# Patient Record
Sex: Male | Born: 1955 | Hispanic: No | Smoking: Never smoker
Health system: Southern US, Community
[De-identification: ages and names within clinical notes are randomized; demographics above are authoritative.]

## PROBLEM LIST (undated history)

## (undated) DIAGNOSIS — I700 Atherosclerosis of aorta: Secondary | ICD-10-CM

## (undated) DIAGNOSIS — I2510 Atherosclerotic heart disease of native coronary artery without angina pectoris: Secondary | ICD-10-CM

## (undated) DIAGNOSIS — I10 Essential (primary) hypertension: Secondary | ICD-10-CM

## (undated) DIAGNOSIS — H35039 Hypertensive retinopathy, unspecified eye: Secondary | ICD-10-CM

## (undated) DIAGNOSIS — Z951 Presence of aortocoronary bypass graft: Secondary | ICD-10-CM

## (undated) DIAGNOSIS — I214 Non-ST elevation (NSTEMI) myocardial infarction: Secondary | ICD-10-CM

## (undated) HISTORY — DX: Hypertensive retinopathy, unspecified eye: H35.039

## (undated) HISTORY — DX: Atherosclerotic heart disease of native coronary artery without angina pectoris: I25.10

## (undated) HISTORY — DX: Essential (primary) hypertension: I10

---

## 1996-07-02 ENCOUNTER — Other Ambulatory Visit: Payer: Self-pay

## 2004-03-30 ENCOUNTER — Emergency Department (HOSPITAL_COMMUNITY): Admission: EM | Admit: 2004-03-30 | Discharge: 2004-03-30 | Payer: Self-pay | Admitting: Emergency Medicine

## 2008-06-09 ENCOUNTER — Encounter: Payer: Self-pay | Admitting: Internal Medicine

## 2008-06-11 ENCOUNTER — Ambulatory Visit: Payer: Self-pay | Admitting: Internal Medicine

## 2008-06-11 LAB — CONVERTED CEMR LAB
ALT: 19 units/L (ref 0–53)
AST: 23 units/L (ref 0–37)
Albumin: 4.5 g/dL (ref 3.5–5.2)
Alkaline Phosphatase: 55 units/L (ref 39–117)
BUN: 15 mg/dL (ref 6–23)
HDL: 42 mg/dL (ref >39)
Hemoglobin, Urine: NEGATIVE
LDL Cholesterol: 125 mg/dL — ABNORMAL HIGH (ref 0–99)
Leukocytes, UA: NEGATIVE
Nitrite: NEGATIVE
Potassium: 3.8 meq/L (ref 3.5–5.3)
Protein, ur: NEGATIVE mg/dL
Sodium: 140 meq/L (ref 135–145)
Total CHOL/HDL Ratio: 4.4
pH: 6 (ref 5.0–8.0)

## 2008-11-05 ENCOUNTER — Ambulatory Visit: Payer: Self-pay | Admitting: Internal Medicine

## 2008-11-05 LAB — CONVERTED CEMR LAB
Calcium: 9.3 mg/dL (ref 8.4–10.5)
Creatinine, Ser: 0.91 mg/dL (ref 0.40–1.50)

## 2011-05-18 NOTE — Consult Note (Signed)
NAMEELDER, Roger Howe NO.:  [id number]   MEDICAL RECORD NO.:  [id number]                   PATIENT TYPE:  EMS   LOCATION:  [facility]                                 FACILITY:  [facility]   PHYSICIAN:  Genene Churn. Love, M.D.                 DATE OF BIRTH:  [birth year]   DATE OF CONSULTATION:  03/30/2004  DATE OF DISCHARGE:                                   CONSULTATION   REASON FOR CONSULTATION:  This 55 year old, right-handed, [place]  married male is seen in the emergency room for evaluation of weakness and  numbness in both arms and both legs at the request of Dr. __________.   HISTORY OF PRESENT ILLNESS:  Roger Howe has a history of hypertension.  Over  the last seven to eight months he has had episodes of numbness and weakness  in both legs.  Yesterday he had some weakness and numbness in his legs and  also today while at work, and was sent to the emergency room at [facility].  He has a known history of untreated hypertension.  He denies any  associated headache, nausea, vomiting, chest pain, palpitations, bowel or  bladder symptoms from each side.  He has had no hospitalizations or  operations and does not have any history of medicines or allergies.  He has  had no history of alcohol or tobacco use.  Previous details of his history  have been dictated on admission which did not occur.  In the emergency room,  he had blood pressures of 190/107, 168/107, 164/104.  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  Intracranial MRA and  extracranial MRA were unremarkable.  Blood studies have included CPK total  of 161, white blood cell count 8600, hemoglobin 14.4, hematocrit 41.5,  platelet count 210,000.  Myoglobin 172 with upper limits of normal 199.  CK-  MB 3.8, troponin-I less than 0.05.  Total protein 7.1, albumin 3.9, SGOT 28,  SGPT 21, alk phos 50, total  protein 0.8, direct bilirubin 0.1, indirect  bilirubin 0.7.  Erythrocyte sed rate 14.  Creatinine 0.8.   IMPRESSION:  1. Numbness and weakness without objective abnormalities on examination.     344.00.  2. Hypertension.  796.2.   PLAN:  The plan at this time is to place the patient on a low dose Maxzide  and refer him to the [facility] physician system to obtain an internist to  follow his blood pressure.                                               Genene Churn. Love, M.D.    JML/MEDQ  D:  03/30/2004  T:  03/31/2004  Job:  [id number]

## 2011-05-18 NOTE — H&P (Signed)
Roger Howe, Roger Howe NO.:  [id number]   MEDICAL RECORD NO.:  [id number]                   PATIENT TYPE:  EMS   LOCATION:  [facility]                                 FACILITY:  [facility]   PHYSICIAN:  Genene Churn. Love, M.D.                 DATE OF BIRTH:  1956-03-30   DATE OF ADMISSION:  03/30/2004  DATE OF DISCHARGE:                                HISTORY & PHYSICAL   This 55 year old right-handed [place] married male is seen in the  emergency room at the request of Dr. Malachy Chamber for evaluation of weakness and  numbness.   HISTORY OF PRESENT ILLNESS:  Roger Howe has a seven to eight month history of  intermittent weakness and numbness. Seven to eight months ago he was seen by  a physician, placed on medication, and he states his symptomatology resolved  in one week. He has had intermittent hypertension which he is not treating.  He noted difficulty with weakness and numbness yesterday involving both arms  and both legs, went to work this day at [employer], and  because of complaints of weakness and numbness was sent to the emergency  room. He has been in the [place] for four years, but has very poor  English and interpretation is through his nephew. The patient has no  operations or hospitalizations. He has had no injuries. He is currently  taking no medications. He does not use drug or alcohol. He has no history of  tobacco use. He finished the 5th grade in [place]. His family history  reveals that he is married, he has five children who are not all in this  country. He has sons 24, 80, and 36, and daughters 4 and 18 who are  apparently in good health. He knows very little about his family history.  His mother is alive and well, but her age is unknown. His father died from  what may have been cancer and he does not know the age. He has one brother  who is 20 and he has five sisters whose ages he does not know. We do not  know of any known  family history of illnesses.   PHYSICAL EXAMINATION:  GENERAL: Well-developed male.  VITAL SIGNS: Blood pressure in the right and left arm 180/100 and 180/105,  heart rate of 72, and no carotid or supraclavicular bruits heard.  NEUROLOGIC: Neck flexion and extension maneuvers are unremarkable. On mental  status exam, he is alert and oriented times three. His cranial nerve  examination reveals the visual fields to be full to count fingers. Both  discs are seen and flat. The extraocular movements are full. Pupils are  reactive from 5 to 3 bilaterally. Corneals present and facial sensation is  equal. He may have had mild bilateral ptosis. Tongue midline. Uvula midline.  Gag present. Hearing present. Air conduction was greater than bone  conduction. The tongue was midline. His motor examination revealed good  strength in the upper and lower extremities. No decreased tone. No focal  atrophy. No fasciculations seen. There is some giving way phenomena with the  right iliopsoas, but other muscle strength appears to be for certain intact.  There is no evidence of any focal weakness and he can perform finger-to-  nose, heel-to-shin, rapid alternating movements with his hands, rapid  alternating movements with his feet. Sensory examination is intact to  pinprick, touch, position, and vibration testing. The deep tendon reflexes  are 1 to 2+. Plantars are downgoing. On gait examination, he can stand on  his toes and stand on his heels. His Romberg testing is negative.  HEART: No murmurs.  ABDOMEN: Bowel sounds normal. There is no organomegaly.  EXTREMITIES: There is no clubbing, cyanosis, or edema.   LABORATORY DATA:  Sodium 142, potassium 3.4, chloride 107, CO2 27, BUN 17,  creatinine 0.8, glucose 88. Hemoglobin 14.6, hematocrit 43.   IMPRESSION:  1. Complaint of generalized weakness not seen on examination (code 344.00).  2. Hypertension (code 796.2).   PLAN:  At this time in the emergency room  will obtain CK, CMP, sed rate, and  MRI of the brain and cervical region.                                                Genene Churn. Sandria Manly, M.D.    JML/MEDQ  D:  03/30/2004  T:  03/30/2004  Job:  [id number]

## 2014-05-17 ENCOUNTER — Encounter (HOSPITAL_COMMUNITY): Payer: Self-pay | Admitting: Emergency Medicine

## 2014-05-17 ENCOUNTER — Inpatient Hospital Stay (HOSPITAL_COMMUNITY)
Admission: EM | Admit: 2014-05-17 | Discharge: 2014-05-24 | DRG: 234 | Disposition: A | Discharge status: Home / Self Care | Payer: 59 | Attending: Thoracic Surgery (Cardiothoracic Vascular Surgery) | Admitting: Thoracic Surgery (Cardiothoracic Vascular Surgery)

## 2014-05-17 ENCOUNTER — Emergency Department (HOSPITAL_COMMUNITY): Payer: 59

## 2014-05-17 ENCOUNTER — Emergency Department (HOSPITAL_COMMUNITY)
Admission: EM | Admit: 2014-05-17 | Discharge: 2014-05-17 | Disposition: A | Discharge status: Other Acute Inpatient Hospital | Payer: 59 | Source: Home / Self Care

## 2014-05-17 DIAGNOSIS — M79609 Pain in unspecified limb: Secondary | ICD-10-CM

## 2014-05-17 DIAGNOSIS — E8779 Other fluid overload: Secondary | ICD-10-CM | POA: Diagnosis not present

## 2014-05-17 DIAGNOSIS — J9819 Other pulmonary collapse: Secondary | ICD-10-CM | POA: Diagnosis not present

## 2014-05-17 DIAGNOSIS — I252 Old myocardial infarction: Secondary | ICD-10-CM

## 2014-05-17 DIAGNOSIS — Z951 Presence of aortocoronary bypass graft: Secondary | ICD-10-CM

## 2014-05-17 DIAGNOSIS — R9431 Abnormal electrocardiogram [ECG] [EKG]: Secondary | ICD-10-CM

## 2014-05-17 DIAGNOSIS — Y838 Other surgical procedures as the cause of abnormal reaction of the patient, or of later complication, without mention of misadventure at the time of the procedure: Secondary | ICD-10-CM | POA: Diagnosis not present

## 2014-05-17 DIAGNOSIS — R079 Chest pain, unspecified: Secondary | ICD-10-CM

## 2014-05-17 DIAGNOSIS — M79602 Pain in left arm: Secondary | ICD-10-CM

## 2014-05-17 DIAGNOSIS — I214 Non-ST elevation (NSTEMI) myocardial infarction: Principal | ICD-10-CM | POA: Diagnosis present

## 2014-05-17 DIAGNOSIS — Z7982 Long term (current) use of aspirin: Secondary | ICD-10-CM

## 2014-05-17 DIAGNOSIS — I700 Atherosclerosis of aorta: Secondary | ICD-10-CM

## 2014-05-17 DIAGNOSIS — D72829 Elevated white blood cell count, unspecified: Secondary | ICD-10-CM | POA: Diagnosis not present

## 2014-05-17 DIAGNOSIS — Y9210 Unspecified residential institution as the place of occurrence of the external cause: Secondary | ICD-10-CM | POA: Diagnosis not present

## 2014-05-17 DIAGNOSIS — I10 Essential (primary) hypertension: Secondary | ICD-10-CM

## 2014-05-17 DIAGNOSIS — I2510 Atherosclerotic heart disease of native coronary artery without angina pectoris: Secondary | ICD-10-CM

## 2014-05-17 DIAGNOSIS — D62 Acute posthemorrhagic anemia: Secondary | ICD-10-CM | POA: Diagnosis not present

## 2014-05-17 DIAGNOSIS — J988 Other specified respiratory disorders: Secondary | ICD-10-CM | POA: Diagnosis not present

## 2014-05-17 DIAGNOSIS — Z8249 Family history of ischemic heart disease and other diseases of the circulatory system: Secondary | ICD-10-CM

## 2014-05-17 HISTORY — DX: Atherosclerotic heart disease of native coronary artery without angina pectoris: I25.10

## 2014-05-17 HISTORY — PX: CARDIAC CATHETERIZATION: SHX172

## 2014-05-17 HISTORY — DX: Non-ST elevation (NSTEMI) myocardial infarction: I21.4

## 2014-05-17 HISTORY — DX: Atherosclerosis of aorta: I70.0

## 2014-05-17 HISTORY — DX: Presence of aortocoronary bypass graft: Z95.1

## 2014-05-17 LAB — CBC WITH DIFFERENTIAL/PLATELET
BASOS PCT: 0 % (ref 0–1)
Basophils Absolute: 0 10*3/uL (ref 0.0–0.1)
EOS ABS: 0.1 10*3/uL (ref 0.0–0.7)
Eosinophils Relative: 1 % (ref 0–5)
HEMATOCRIT: 40.6 % (ref 39.0–52.0)
HEMOGLOBIN: 14.3 g/dL (ref 13.0–17.0)
LYMPHS ABS: 2 10*3/uL (ref 0.7–4.0)
Lymphocytes Relative: 22 % (ref 12–46)
MCH: 28 pg (ref 26.0–34.0)
MCHC: 35.2 g/dL (ref 30.0–36.0)
MCV: 79.6 fL (ref 78.0–100.0)
MONO ABS: 0.5 10*3/uL (ref 0.1–1.0)
MONOS PCT: 5 % (ref 3–12)
NEUTROS PCT: 72 % (ref 43–77)
Neutro Abs: 6.8 10*3/uL (ref 1.7–7.7)
Platelets: 257 10*3/uL (ref 150–400)
RBC: 5.1 MIL/uL (ref 4.22–5.81)
RDW: 13.4 % (ref 11.5–15.5)
WBC: 9.4 10*3/uL (ref 4.0–10.5)

## 2014-05-17 LAB — I-STAT CHEM 8, ED
BUN: 8 mg/dL (ref 6–23)
CALCIUM ION: 1.16 mmol/L (ref 1.12–1.23)
CREATININE: 1 mg/dL (ref 0.50–1.35)
Chloride: 104 mEq/L (ref 96–112)
GLUCOSE: 95 mg/dL (ref 70–99)
HCT: 44 % (ref 39.0–52.0)
HEMOGLOBIN: 15 g/dL (ref 13.0–17.0)
Potassium: 3.7 mEq/L (ref 3.7–5.3)
SODIUM: 143 meq/L (ref 137–147)
TCO2: 25 mmol/L (ref 0–100)

## 2014-05-17 LAB — I-STAT TROPONIN, ED: Troponin i, poc: 1.32 ng/mL (ref 0.00–0.08)

## 2014-05-17 LAB — TROPONIN I: TROPONIN I: 1.2 ng/mL — AB (ref <0.30)

## 2014-05-17 LAB — TSH: TSH: 1.49 u[IU]/mL (ref 0.350–4.500)

## 2014-05-17 LAB — MRSA PCR SCREENING: MRSA BY PCR: NEGATIVE

## 2014-05-17 MED ORDER — SODIUM CHLORIDE 0.9 % IV SOLN
1.0000 mL/kg/h | INTRAVENOUS | Status: DC
  Administered 2014-05-17: 1 mL/kg/h via INTRAVENOUS

## 2014-05-17 MED ORDER — ASPIRIN 81 MG PO CHEW
324.0000 mg | CHEWABLE_TABLET | ORAL | Status: AC
  Administered 2014-05-18: 324 mg via ORAL

## 2014-05-17 MED ORDER — SODIUM CHLORIDE 0.9 % IV SOLN: INTRAVENOUS | Status: DC

## 2014-05-17 MED ORDER — NITROGLYCERIN 2 % TD OINT
1.0000 [in_us] | TOPICAL_OINTMENT | Freq: Once | TRANSDERMAL | Status: AC
  Administered 2014-05-17: 1 [in_us] via TOPICAL
  Filled 2014-05-17: qty 1

## 2014-05-17 MED ORDER — IOHEXOL 350 MG/ML SOLN
100.0000 mL | Freq: Once | INTRAVENOUS | Status: AC | PRN
  Administered 2014-05-17: 100 mL via INTRAVENOUS

## 2014-05-17 MED ORDER — ASPIRIN 300 MG RE SUPP
300.0000 mg | RECTAL | Status: AC
  Filled 2014-05-17: qty 1

## 2014-05-17 MED ORDER — SODIUM CHLORIDE 0.9 % IJ SOLN: 3.0000 mL | INTRAMUSCULAR | Status: DC | PRN

## 2014-05-17 MED ORDER — ASPIRIN EC 81 MG PO TBEC: 81.0000 mg | DELAYED_RELEASE_TABLET | Freq: Every day | ORAL | Status: DC

## 2014-05-17 MED ORDER — SODIUM CHLORIDE 0.9 % IV SOLN: 250.0000 mL | INTRAVENOUS | Status: DC | PRN

## 2014-05-17 MED ORDER — ASPIRIN EC 81 MG PO TBEC
81.0000 mg | DELAYED_RELEASE_TABLET | Freq: Every day | ORAL | Status: DC
  Administered 2014-05-19: 81 mg via ORAL
  Filled 2014-05-17 (×2): qty 1

## 2014-05-17 MED ORDER — NITROGLYCERIN 2 % TD OINT
1.0000 [in_us] | TOPICAL_OINTMENT | Freq: Four times a day (QID) | TRANSDERMAL | Status: DC
  Administered 2014-05-17 – 2014-05-20 (×9): 1 [in_us] via TOPICAL
  Filled 2014-05-17: qty 30

## 2014-05-17 MED ORDER — HEPARIN (PORCINE) IN NACL 100-0.45 UNIT/ML-% IJ SOLN
1050.0000 [IU]/h | INTRAMUSCULAR | Status: DC
  Administered 2014-05-17: 850 [IU]/h via INTRAVENOUS
  Filled 2014-05-17 (×3): qty 250

## 2014-05-17 MED ORDER — METOPROLOL TARTRATE 25 MG PO TABS
25.0000 mg | ORAL_TABLET | Freq: Two times a day (BID) | ORAL | Status: DC
  Administered 2014-05-17 – 2014-05-19 (×5): 25 mg via ORAL
  Filled 2014-05-17 (×7): qty 1

## 2014-05-17 MED ORDER — DICLOFENAC POTASSIUM 50 MG PO TABS: 50.0000 mg | ORAL_TABLET | Freq: Three times a day (TID) | ORAL | Status: DC

## 2014-05-17 MED ORDER — HEPARIN BOLUS VIA INFUSION
4000.0000 [IU] | Freq: Once | INTRAVENOUS | Status: AC
  Administered 2014-05-17: 4000 [IU] via INTRAVENOUS
  Filled 2014-05-17: qty 4000

## 2014-05-17 MED ORDER — ASPIRIN 81 MG PO CHEW
81.0000 mg | CHEWABLE_TABLET | ORAL | Status: AC
  Administered 2014-05-18: 81 mg via ORAL
  Filled 2014-05-17: qty 1

## 2014-05-17 MED ORDER — SODIUM CHLORIDE 0.9 % IJ SOLN
3.0000 mL | Freq: Two times a day (BID) | INTRAMUSCULAR | Status: DC
  Administered 2014-05-18: 3 mL via INTRAVENOUS

## 2014-05-17 MED ORDER — SODIUM CHLORIDE 0.9 % IV SOLN
Freq: Once | INTRAVENOUS | Status: AC
  Administered 2014-05-17: 11:00:00 via INTRAVENOUS

## 2014-05-17 MED ORDER — NITROGLYCERIN 0.4 MG SL SUBL: 0.4000 mg | SUBLINGUAL_TABLET | SUBLINGUAL | Status: DC | PRN

## 2014-05-17 MED ORDER — ATORVASTATIN CALCIUM 20 MG PO TABS
20.0000 mg | ORAL_TABLET | Freq: Every day | ORAL | Status: DC
  Administered 2014-05-17 – 2014-05-19 (×3): 20 mg via ORAL
  Filled 2014-05-17 (×4): qty 1

## 2014-05-17 NOTE — Progress Notes (Signed)
Unit CM UR Completed by [facility] ED CM  W. Calil Amor RN  

## 2014-05-17 NOTE — ED Notes (Signed)
Placed  On nasal o2  At 2  l  /  Min  Cardiac  Monitor  And  Nasal  02  At  2  l  /  Min

## 2014-05-17 NOTE — ED Notes (Signed)
Report given to Carlene, RN.

## 2014-05-17 NOTE — Consult Note (Signed)
ANTICOAGULATION CONSULT NOTE - Initial Consult  Pharmacy Consult for Heparin Indication: chest pain/ACS  No Known Allergies  Patient Measurements: Height: 5' 0.75" (154.3 cm) Weight: 160 lb 7.9 oz (72.8 kg) IBW/kg (Calculated) : 51.73 Heparin Dosing Weight: 66kg  Vital Signs: Temp: 98.3 F (36.8 C) (05/18 1134) Temp src: Oral (05/18 1134) BP: 159/97 mmHg (05/18 1730) Pulse Rate: 80 (05/18 1730)  Labs:  Recent Labs  05/17/14 1322 05/17/14 1340  HGB 14.3 15.0  HCT 40.6 44.0  PLT 257  --   CREATININE  --  1.00    Estimated Creatinine Clearance: 67.6 ml/min (by C-G formula based on Cr of 1).   Medical History: Past Medical History  Diagnosis Date  . Hypertension   . Chest pain at rest 05/17/2014    Medications:  No anticoagulants pta  Assessment: 57yom presents to the ED with chest pain. Initial troponin is positive at 1.32. He will begin IV heparin for NSTEMI. Baseline CBC and renal function wnl.  Goal of Therapy:  Heparin level 0.3-0.7 units/ml Monitor platelets by anticoagulation protocol: Yes   Plan:  1) Heparin bolus 4000  units x 1 2) Heparin drip at 850 units/hr 3) Check 6 hour heparin level 4) Daily heparin level and CBC  Roger Howe Roger Howe 05/17/2014,5:49 PM

## 2014-05-17 NOTE — ED Notes (Signed)
EDP made aware of patients trop level.

## 2014-05-17 NOTE — ED Notes (Signed)
Patient transported to CT 

## 2014-05-17 NOTE — H&P (Signed)
Roger Howe is an 58 y.o. male.    Primary Cardiologist:new No primary provider on file.  Chief Complaint:  Chest and lt arm pain began 1 week ago and returned today but worse.  Does not speak [place], his nephew interpreted with the pt's permission.  HPI: 58 year old [place] male, married with 3 sons, presented to ER today with chest pain and lt arm pain.  Described as a burning pain with radiation to his back.  Discomfort began 1 week ago stabilized and then increased in severity this AM. Some SOB this am but no nausea, diaphoresis, or lightheadedness. No prior cardiac history.  He did take ASA this am.  Over the last week the pain would occur 2-3 times a day.  Pt has lived in [place] for 14 years.  Some of his family is here in the [place] other members are in [place].  He has hx of HTN but takes no meds.  Troponin Positive at 1.32, BP elevated.  EKG with LVH poss Lt atrial enlargement follow up EKG with less t wave inversion in V4-6  Past Medical History  Diagnosis Date  . Hypertension   . Chest pain at rest 05/17/2014    History reviewed. No pertinent past surgical history.  Family History  Problem Relation Age of Onset  . Heart disease Brother     lives in [place]   Social History:  reports that he has never smoked. He has never used smokeless tobacco. He reports that he does not drink alcohol or use illicit drugs. Married with 3 sons Works  Allergies: No Known Allergies  OUTPATIENT MEDICATIONS: none  Results for orders placed during the hospital encounter of 05/17/14 (from the past 48 hour(s))  CBC WITH DIFFERENTIAL     Status: None   Collection Time    05/17/14  1:22 PM      Result Value Ref Range   WBC 9.4  4.0 - 10.5 K/uL   RBC 5.10  4.22 - 5.81 MIL/uL   Hemoglobin 14.3  13.0 - 17.0 g/dL   HCT 40.940.6  81.139.0 - 91.452.0 %   MCV 79.6  78.0 - 100.0 fL   MCH 28.0  26.0 - 34.0 pg   MCHC 35.2  30.0 - 36.0 g/dL   RDW 78.213.4  95.611.5 - 21.315.5 %   Platelets 257  150 - 400  K/uL   Neutrophils Relative % 72  43 - 77 %   Neutro Abs 6.8  1.7 - 7.7 K/uL   Lymphocytes Relative 22  12 - 46 %   Lymphs Abs 2.0  0.7 - 4.0 K/uL   Monocytes Relative 5  3 - 12 %   Monocytes Absolute 0.5  0.1 - 1.0 K/uL   Eosinophils Relative 1  0 - 5 %   Eosinophils Absolute 0.1  0.0 - 0.7 K/uL   Basophils Relative 0  0 - 1 %   Basophils Absolute 0.0  0.0 - 0.1 K/uL  I-STAT TROPOININ, Roger     Status: Abnormal   Collection Time    05/17/14  1:38 PM      Result Value Ref Range   Troponin i, poc 1.32 (*) 0.00 - 0.08 ng/mL   Comment NOTIFIED PHYSICIAN     Comment 3            Comment: Due to the release kinetics of cTnI,     a negative result within the first hours  of the onset of symptoms does not rule out     myocardial infarction with certainty.     If myocardial infarction is still suspected,     repeat the test at appropriate intervals.  I-STAT CHEM 8, Roger     Status: None   Collection Time    05/17/14  1:40 PM      Result Value Ref Range   Sodium 143  137 - 147 mEq/L   Potassium 3.7  3.7 - 5.3 mEq/L   Chloride 104  96 - 112 mEq/L   BUN 8  6 - 23 mg/dL   Creatinine, Ser 9.56  0.50 - 1.35 mg/dL   Glucose, Bld 95  70 - 99 mg/dL   Calcium, Ion 2.13  0.86 - 1.23 mmol/L   TCO2 25  0 - 100 mmol/L   Hemoglobin 15.0  13.0 - 17.0 g/dL   HCT 57.8  46.9 - 62.9 %   Dg Chest Port 1 View  05/17/2014   CLINICAL DATA:  Chest pain, hypertension  EXAM: PORTABLE CHEST - 1 VIEW  COMPARISON:  Portable exam 1323 hr without priors for comparison  FINDINGS: Mild enlargement of cardiac silhouette.  Mediastinal contours and pulmonary vascularity normal.  Lungs clear.  No pleural effusion or pneumothorax.  Osseous structures unremarkable.  IMPRESSION: Mild enlargement of cardiac silhouette.  No acute abnormalities.   Electronically Signed   By: Ulyses Southward M.D.   On: 05/17/2014 13:30    ROS: General:no colds or fevers, no weight changes Skin:no rashes or ulcers HEENT:no blurred vision, no  congestion CV:see HPI PUL:see HPI GI:no diarrhea constipation or melena, no indigestion GU:no hematuria, no dysuria MS:no joint pain, no claudication Neuro:no syncope, no lightheadedness Endo:no diabetes, no thyroid disease   Blood pressure 159/98, pulse 80, temperature 98.3 F (36.8 C), temperature source Oral, resp. rate 21, height 5' 0.75" (1.543 m), SpO2 96.00%. PE: General:Pleasant affect, NAD Skin:Warm and dry, brisk capillary refill HEENT:normocephalic, sclera clear, mucus membranes moist Neck:supple, no JVD, no bruits  Heart:S1S2 RRR without murmur, gallup, rub or click Lungs:clear without rales, rhonchi, or wheezes BMW:UXLK, non tender, + BS, do not palpate liver spleen or masses Ext:no lower ext edema, 2+ pedal pulses, 2+ radial pulses Neuro:alert and oriented, MAE, follows commands, + facial symmetry    Assessment/Plan Principal Problem:   NSTEMI (non-ST elevated myocardial infarction), with lateral T wave changes, admit and further eval enzymes.  Add IV heparin, NTG paste.  BB and ace to lower BP.  Most likely cardiac cath in AM Active Problems:   Accelerated Essential hypertension, benign- control BP   Chest pain at rest, pain resolved currently.   Enlarged, mildly, cardiac silhouette check echo.  Discussed the above with pt and his nephew.  They are agreeable with plan.  Will check chol. As well.  Nada Boozer Nurse Practitioner Certified [facility] Pager [phone] or after 5pm or weekends call [phone] 05/17/2014, 5:05 PM   I have seen and evaluated the patient this PM along with Nada Boozer, NP-C. I agree with her findings, examination as well as impression recommendations.  Very quiet (doen't speak [place]) [place] gentleman with h/o untreated HTN & FH (of brother with CAD-Stent) who started having exertional & PM chest pressure/burning in center of chest.  Came to ER b/c Sx began this AM & were worse & associated with SOB. Denies  any CHF Sx of PND/orhtopnea or edema, no HA/TIA or Amaurosis Fugax Sx.   Ruled in for NSTEMI.  PLAN. Agree with BP control - BB, Nitrate With accelerated HTN & enlarge Cardiac Sillouette - agree with Chest CTA to look for dissection since pain radiates to center of back. Statin  Provided CT is OK -- would plan for [facility] +/- PCI in AM.  I spent ~20-25 min in consultation using his son as an interpreter discussing findinds & recommendations including explaining the R/B/A/I of LHC-Angio +/- PCI.  Performing MD:  [name], M.D., M.S.  Procedure:  LEFT HEART CATHETERIZATION WITH CORONARY ANGIOGRAPHY +/- PCI  The procedure with Risks/Benefits/Alternatives and Indications was reviewed with the patient & family.  All questions were answered.    Risks / Complications include, but not limited to: Death, MI, CVA/TIA, VF/VT (with defibrillation), Bradycardia (need for temporary pacer placement), contrast induced nephropathy, bleeding / bruising / hematoma / pseudoaneurysm, vascular or coronary injury (with possible emergent CT or Vascular Surgery), adverse medication reactions, infection.    The patient & family voice understanding and agree to proceed.   Will have [place] interpreter present for confirmed consent prior to procedure.   [name], M.D., M.S. Interventional Cardiologist   Pager # [phone] 05/17/2014

## 2014-05-17 NOTE — ED Provider Notes (Signed)
CSN: [id number]     Arrival date & time 05/17/14  1121 History   First MD Initiated Contact with Patient 05/17/14 1253     No chief complaint on file.  Chief complaint chest pain. History is obtained from [employer] language line using medical interpreter. Patient speaks no English  (Consider location/radiation/quality/duration/timing/severity/associated sxs/prior Treatment) HPI complains of anterior chest pain radiating to left arm onset 7:30 AM today which awakened him from sleep. Symptoms lasted one hour. Resoved spontaneously. Treated with aspirin prior to coming here. No associated nausea shortness of breath or sweatiness. Never had similar symptoms before. Nothing makes symptoms better or worse. Pain is moderate . Resolved now brought via ems Complains of an Past Medical History  Diagnosis Date  . Hypertension    History reviewed. No pertinent past surgical history. History reviewed. No pertinent family history. History  Substance Use Topics  . Smoking status: Never Smoker   . Smokeless tobacco: Not on file  . Alcohol Use: No    Review of Systems  Constitutional: Negative.   HENT: Negative.   Respiratory: Negative.   Cardiovascular: Positive for chest pain.  Gastrointestinal: Negative.   Musculoskeletal: Negative.   Skin: Negative.   Neurological: Negative.   Psychiatric/Behavioral: Negative.   All other systems reviewed and are negative.     Allergies  Review of patient's allergies indicates no known allergies.  Home Medications   Prior to Admission medications   Medication Sig Start Date End Date Taking? Authorizing Provider  aspirin 325 MG tablet Take 325 mg by mouth daily as needed (pain).   Yes Historical Provider, MD   BP 172/98  Pulse 81  Temp(Src) 98.3 F (36.8 C) (Oral)  Resp 21  Ht 5' 0.75" (1.543 m)  SpO2 97% Physical Exam  Nursing note and vitals reviewed. Constitutional: He appears well-developed and well-nourished.  HENT:  Head: Normocephalic  and atraumatic.  Eyes: Conjunctivae are normal. Pupils are equal, round, and reactive to light.  Neck: Neck supple. No tracheal deviation present. No thyromegaly present.  Cardiovascular: Normal rate and regular rhythm.   No murmur heard. Pulmonary/Chest: Effort normal and breath sounds normal.  Abdominal: Soft. Bowel sounds are normal. He exhibits no distension. There is no tenderness.  Musculoskeletal: Normal range of motion. He exhibits no edema and no tenderness.  Neurological: He is alert. Coordination normal.  Skin: Skin is warm and dry. No rash noted.  Psychiatric: He has a normal mood and affect.    ED Course  Procedures (including critical care time) Labs Review Labs Reviewed - No data to display  Imaging Review No results found.   EKG Interpretation   Date/Time:  Monday May 17 2014 11:32:23 EDT Ventricular Rate:  77 PR Interval:  175 QRS Duration: 105 QT Interval:  413 QTC Calculation: 467 R Axis:   49 Text Interpretation:  Sinus rhythm Probable left atrial enlargement LVH  with secondary repolarization abnormality No significant change since last  tracing Confirmed by Ethelda ChickJACUBOWITZ  MD, Takahiro Godinho [phone]([id number]) on 05/17/2014 12:17:30 PM     2:20 PM patient remains asymptomatic, pain-free Chest xray viewed by me Results for orders placed during the hospital encounter of 05/17/14  CBC WITH DIFFERENTIAL      Result Value Ref Range   WBC 9.4  4.0 - 10.5 K/uL   RBC 5.10  4.22 - 5.81 MIL/uL   Hemoglobin 14.3  13.0 - 17.0 g/dL   HCT 19.140.6  47.839.0 - 29.552.0 %   MCV 79.6  78.0 - 100.0 fL  MCH 28.0  26.0 - 34.0 pg   MCHC 35.2  30.0 - 36.0 g/dL   RDW 41.9  62.2 - 29.7 %   Platelets 257  150 - 400 K/uL   Neutrophils Relative % 72  43 - 77 %   Neutro Abs 6.8  1.7 - 7.7 K/uL   Lymphocytes Relative 22  12 - 46 %   Lymphs Abs 2.0  0.7 - 4.0 K/uL   Monocytes Relative 5  3 - 12 %   Monocytes Absolute 0.5  0.1 - 1.0 K/uL   Eosinophils Relative 1  0 - 5 %   Eosinophils Absolute 0.1  0.0 -  0.7 K/uL   Basophils Relative 0  0 - 1 %   Basophils Absolute 0.0  0.0 - 0.1 K/uL  I-STAT CHEM 8, ED      Result Value Ref Range   Sodium 143  137 - 147 mEq/L   Potassium 3.7  3.7 - 5.3 mEq/L   Chloride 104  96 - 112 mEq/L   BUN 8  6 - 23 mg/dL   Creatinine, Ser 9.89  0.50 - 1.35 mg/dL   Glucose, Bld 95  70 - 99 mg/dL   Calcium, Ion 2.11  9.41 - 1.23 mmol/L   TCO2 25  0 - 100 mmol/L   Hemoglobin 15.0  13.0 - 17.0 g/dL   HCT 74.0  81.4 - 48.1 %  I-STAT TROPOININ, ED      Result Value Ref Range   Troponin i, poc 1.32 (*) 0.00 - 0.08 ng/mL   Comment NOTIFIED PHYSICIAN     Comment 3            Dg Chest Port 1 View  05/17/2014   CLINICAL DATA:  Chest pain, hypertension  EXAM: PORTABLE CHEST - 1 VIEW  COMPARISON:  Portable exam 1323 hr without priors for comparison  FINDINGS: Mild enlargement of cardiac silhouette.  Mediastinal contours and pulmonary vascularity normal.  Lungs clear.  No pleural effusion or pneumothorax.  Osseous structures unremarkable.  IMPRESSION: Mild enlargement of cardiac silhouette.  No acute abnormalities.   Electronically Signed   By: Ulyses Southward M.D.   On: 05/17/2014 13:30    MDM   Final diagnoses:  None   iCardiology consult to evaluate patient Diagnosis: #1NSTEMI #2 hypertension     Doug Sou, MD 05/17/14 1429

## 2014-05-17 NOTE — ED Notes (Signed)
Results of troponin given to Dr. Shela Commons

## 2014-05-17 NOTE — ED Provider Notes (Signed)
CSN: [id number]     Arrival date & time 05/17/14  0370 History   First MD Initiated Contact with Patient 05/17/14 0913     Chief Complaint  Patient presents with  . Hypertension   (Consider location/radiation/quality/duration/timing/severity/associated sxs/prior Treatment) HPI Comments: 58 year old [place] male is accompanied by a translator friend. He is complaining of intermittent pain to the left wrist, forearm, deltoid and left upper anterior chest  for approximately one week. He occasionally has numbness down the left arm particularly in the wrist and forearm. His job requires repetitive use of both arms. Denies any known trauma. He had an episode of pain this morning and lasted for a couple of hours. He took an aspirin and this abated the pain. He has taken aspirin before which has abated the pain. The pain occurs one to 3 times a day and lasts for one to 2 hours at a time. It occurs at work and at home.   History reviewed. No pertinent past medical history. History reviewed. No pertinent past surgical history. History reviewed. No pertinent family history. History  Substance Use Topics  . Smoking status: Never Smoker   . Smokeless tobacco: Not on file  . Alcohol Use: No    Review of Systems  Constitutional: Negative.   Respiratory: Negative.   Gastrointestinal: Negative.   Genitourinary: Negative.   Musculoskeletal: Negative for arthralgias and neck pain.       As per HPI  Skin: Negative.   Neurological: Negative for dizziness, weakness, numbness and headaches.    Allergies  Review of patient's allergies indicates no known allergies.  Home Medications   Prior to Admission medications   Not on File   BP 188/110  Pulse 102  Temp(Src) 99.2 F (37.3 C) (Oral)  Resp 18  SpO2 100% Physical Exam  Nursing note and vitals reviewed. Constitutional: He is oriented to person, place, and time. He appears well-developed and well-nourished.  HENT:  Head: Normocephalic and  atraumatic.  Eyes: EOM are normal.  Neck: Normal range of motion. Neck supple.  Cardiovascular: Normal rate and regular rhythm.   Pulmonary/Chest: Effort normal and breath sounds normal. No respiratory distress. He has no wheezes.  Musculoskeletal:  Examination of the left shoulder, upper arm, forearm, wrist and hand reveal no areas of tenderness, swelling, deformity, discoloration. There is full range of motion of the left shoulder without pain resistance. Palpation along the entire left arm and shoulder does not produce pain. Distal neurovascular motor sensory is grossly intact. Radial pulse 2+. Capillary refill less than 2 seconds. Negative Tinel's negative Phalen's. No tenderness over the medial or lateral epicondyles. Negative Finkelstein.  Neurological: He is alert and oriented to person, place, and time. No cranial nerve deficit.  Skin: Skin is warm and dry.  Psychiatric: He has a normal mood and affect.    ED Course  Procedures (including critical care time) Labs Review Labs Reviewed - No data to display  Imaging Review No results found. EKG: NSR. No ectopy. T wave inversions III, T wave abnormality aVF, ST depression with T wave abnormalities V4,V5,V6. LVH.  MDM   1. Arm pain, left   2. Muscle pain, myofascial    No objective findings today and unable to reproduce the pain. Nl musculoskeletal exam.  Due to hx of untreated HTN, abnormal EKG will transfer to the ED vie [facility] for evaluation of possible angina.      Hayden Rasmussen, NP 05/17/14 1012

## 2014-05-17 NOTE — ED Notes (Signed)
Pt  Reports  Symptoms  Of           Numbness  And  Pain l  Arm  X  1  Week   With  Neck pain  As  Well   -  Pt  Also  Reports a  Cough  /  Congestion     He  Has  A  History of  High blood  In past  But  Has  Been  Non  Compliant       He  Ambulated  To  Room     He  Is  Verbally  Responsive  Had  To  Nose  Coordination intact  Hand  Grips  Strong      Able  To  Smile

## 2014-05-17 NOTE — ED Notes (Signed)
Patient arrived via [facility] from [facility] with intermittent chest pain x1 wk. Patient denies any shortness of breath, nausea, vomiting or diaphoresis. He was awake this morning and developed chest discomfort described as sharp pain that radiated to his left arm that was constant without any relief. Patient took asprin at home and had some relief with it. Patient denies any chest pain at this time, he is hypertensive.

## 2014-05-18 ENCOUNTER — Other Ambulatory Visit: Payer: Self-pay | Admitting: *Deleted

## 2014-05-18 ENCOUNTER — Encounter (HOSPITAL_COMMUNITY)
Admission: EM | Disposition: A | Discharge status: Home / Self Care | Payer: Self-pay | Source: Home / Self Care | Attending: Thoracic Surgery (Cardiothoracic Vascular Surgery)

## 2014-05-18 ENCOUNTER — Ambulatory Visit (HOSPITAL_COMMUNITY): Admit: 2014-05-18 | Payer: Self-pay | Admitting: Cardiology

## 2014-05-18 ENCOUNTER — Encounter (HOSPITAL_COMMUNITY): Payer: Self-pay | Admitting: Thoracic Surgery (Cardiothoracic Vascular Surgery)

## 2014-05-18 DIAGNOSIS — I2510 Atherosclerotic heart disease of native coronary artery without angina pectoris: Secondary | ICD-10-CM | POA: Diagnosis present

## 2014-05-18 DIAGNOSIS — I700 Atherosclerosis of aorta: Secondary | ICD-10-CM | POA: Diagnosis present

## 2014-05-18 HISTORY — DX: Atherosclerotic heart disease of native coronary artery without angina pectoris: I25.10

## 2014-05-18 HISTORY — PX: LEFT HEART CATHETERIZATION WITH CORONARY ANGIOGRAM: SHX5451

## 2014-05-18 LAB — BASIC METABOLIC PANEL
BUN: 8 mg/dL (ref 6–23)
CHLORIDE: 103 meq/L (ref 96–112)
CO2: 27 meq/L (ref 19–32)
Calcium: 8.9 mg/dL (ref 8.4–10.5)
Creatinine, Ser: 0.85 mg/dL (ref 0.50–1.35)
GFR calc non Af Amer: >90 mL/min (ref >90)
Glucose, Bld: 98 mg/dL (ref 70–99)
POTASSIUM: 3.4 meq/L — AB (ref 3.7–5.3)
SODIUM: 141 meq/L (ref 137–147)

## 2014-05-18 LAB — CBC
HEMATOCRIT: 39.5 % (ref 39.0–52.0)
Hemoglobin: 13.4 g/dL (ref 13.0–17.0)
MCH: 27.3 pg (ref 26.0–34.0)
MCHC: 33.9 g/dL (ref 30.0–36.0)
MCV: 80.6 fL (ref 78.0–100.0)
PLATELETS: 294 10*3/uL (ref 150–400)
RBC: 4.9 MIL/uL (ref 4.22–5.81)
RDW: 13.6 % (ref 11.5–15.5)
WBC: 9.2 10*3/uL (ref 4.0–10.5)

## 2014-05-18 LAB — HEPARIN LEVEL (UNFRACTIONATED)
HEPARIN UNFRACTIONATED: 0.33 [IU]/mL (ref 0.30–0.70)
Heparin Unfractionated: 0.18 IU/mL — ABNORMAL LOW (ref 0.30–0.70)

## 2014-05-18 LAB — TROPONIN I
TROPONIN I: 0.88 ng/mL — AB (ref <0.30)
Troponin I: 1.17 ng/mL (ref <0.30)

## 2014-05-18 LAB — LIPID PANEL
CHOL/HDL RATIO: 6.8 ratio
CHOLESTEROL: 184 mg/dL (ref 0–200)
HDL: 27 mg/dL — ABNORMAL LOW (ref >39)
LDL CALC: 125 mg/dL — AB (ref 0–99)
Triglycerides: 162 mg/dL — ABNORMAL HIGH (ref <150)
VLDL: 32 mg/dL (ref 0–40)

## 2014-05-18 LAB — PROTIME-INR
INR: 1.17 (ref 0.00–1.49)
Prothrombin Time: 14.7 seconds (ref 11.6–15.2)

## 2014-05-18 LAB — HEMOGLOBIN A1C
Hgb A1c MFr Bld: 5.7 % — ABNORMAL HIGH (ref <5.7)
MEAN PLASMA GLUCOSE: 117 mg/dL — AB (ref <117)

## 2014-05-18 LAB — MAGNESIUM: Magnesium: 2 mg/dL (ref 1.5–2.5)

## 2014-05-18 LAB — T4, FREE: Free T4: 0.89 ng/dL (ref 0.80–1.80)

## 2014-05-18 SURGERY — LEFT HEART CATHETERIZATION WITH CORONARY ANGIOGRAM
Anesthesia: LOCAL

## 2014-05-18 MED ORDER — SODIUM CHLORIDE 0.9 % IV SOLN: 1.0000 mL/kg/h | INTRAVENOUS | Status: AC

## 2014-05-18 MED ORDER — FENTANYL CITRATE 0.05 MG/ML IJ SOLN
INTRAMUSCULAR | Status: AC
Start: 2014-05-18 — End: 2014-05-18
  Filled 2014-05-18: qty 2

## 2014-05-18 MED ORDER — HEPARIN (PORCINE) IN NACL 100-0.45 UNIT/ML-% IJ SOLN
1050.0000 [IU]/h | INTRAMUSCULAR | Status: DC
  Administered 2014-05-18: 1050 [IU]/h via INTRAVENOUS
  Filled 2014-05-18 (×2): qty 250

## 2014-05-18 MED ORDER — MIDAZOLAM HCL 2 MG/2ML IJ SOLN
INTRAMUSCULAR | Status: AC
  Filled 2014-05-18: qty 2

## 2014-05-18 MED ORDER — HEPARIN (PORCINE) IN NACL 2-0.9 UNIT/ML-% IJ SOLN
INTRAMUSCULAR | Status: AC
  Filled 2014-05-18: qty 1500

## 2014-05-18 MED ORDER — HEPARIN BOLUS VIA INFUSION
2000.0000 [IU] | Freq: Once | INTRAVENOUS | Status: AC
  Administered 2014-05-18: 03:00:00 2000 [IU] via INTRAVENOUS
  Filled 2014-05-18: qty 2000

## 2014-05-18 MED ORDER — SODIUM CHLORIDE 0.9 % IJ SOLN
3.0000 mL | Freq: Two times a day (BID) | INTRAMUSCULAR | Status: DC
  Administered 2014-05-18: 20:00:00 3 mL via INTRAVENOUS

## 2014-05-18 MED ORDER — HEPARIN SODIUM (PORCINE) 1000 UNIT/ML IJ SOLN
INTRAMUSCULAR | Status: AC
  Filled 2014-05-18: qty 1

## 2014-05-18 MED ORDER — VERAPAMIL HCL 2.5 MG/ML IV SOLN
INTRAVENOUS | Status: AC
Start: 2014-05-18 — End: 2014-05-18
  Filled 2014-05-18: qty 2

## 2014-05-18 MED ORDER — LISINOPRIL 5 MG PO TABS
5.0000 mg | ORAL_TABLET | Freq: Every day | ORAL | Status: DC
  Administered 2014-05-18 – 2014-05-19 (×2): 5 mg via ORAL
  Filled 2014-05-18 (×3): qty 1

## 2014-05-18 MED ORDER — SODIUM CHLORIDE 0.9 % IV SOLN: 250.0000 mL | INTRAVENOUS | Status: DC | PRN

## 2014-05-18 MED ORDER — SODIUM CHLORIDE 0.9 % IJ SOLN: 3.0000 mL | INTRAMUSCULAR | Status: DC | PRN

## 2014-05-18 MED ORDER — LIDOCAINE HCL (PF) 1 % IJ SOLN
INTRAMUSCULAR | Status: AC
  Filled 2014-05-18: qty 30

## 2014-05-18 MED ORDER — NITROGLYCERIN 0.2 MG/ML ON CALL CATH LAB
INTRAVENOUS | Status: AC
  Filled 2014-05-18: qty 1

## 2014-05-18 NOTE — ED Provider Notes (Signed)
Medical screening examination/treatment/procedure(s) were performed by non-physician practitioner and as supervising physician I was immediately available for consultation/collaboration.  Leslee Home, M.D.  Reuben Likes, MD 05/18/14 [phone]

## 2014-05-18 NOTE — H&P (View-Only) (Signed)
    57 y/o [place] gentleman with h/o untreated HTN presented with acute onset SSCP - ruled in for NSTEMI  Subjective:  No further CP o/n.  No HA  Objective:  Vital Signs in the last 24 hours: Temp:  [97.9 F (36.6 C)-99.2 F (37.3 C)] 98.1 F (36.7 C) (05/19 0700) Pulse Rate:  [57-102] 64 (05/19 0700) Resp:  [15-27] 18 (05/19 0700) BP: (128-188)/(78-110) 171/88 mmHg (05/19 0700) SpO2:  [96 %-100 %] 97 % (05/19 0700) Weight:  [160 lb 7.9 oz (72.8 kg)-174 lb 2.6 oz (79 kg)] 174 lb 2.6 oz (79 kg) (05/19 0001)  Intake/Output from previous day:   Intake/Output from this shift:    Physical Exam: General appearance: alert, cooperative, appears stated age and no distress Neck: no adenopathy, no carotid bruit, no JVD and supple, symmetrical, trachea midline Lungs: clear to auscultation bilaterally and normal percussion bilaterally Heart: regular rate and rhythm, S1, S2 normal, no murmur, click, rub or gallop and normal apical impulse Abdomen: soft, non-tender; bowel sounds normal; no masses,  no organomegaly Extremities: extremities normal, atraumatic, no cyanosis or edema Pulses: 2+ and symmetric Neurologic: Grossly normal  Lab Results:  Recent Labs  05/17/14 1322 05/17/14 1340 05/18/14 0054  WBC 9.4  --  9.2  HGB 14.3 15.0 13.4  PLT 257  --  294    Recent Labs  05/17/14 1340 05/18/14 0054  NA 143 141  K 3.7 3.4*  CL 104 103  CO2  --  27  GLUCOSE 95 98  BUN 8 8  CREATININE 1.00 0.85    Recent Labs  05/17/14 2200 05/18/14 0426  TROPONINI 1.20* 1.17*   Hepatic Function Panel No results found for this basename: PROT, ALBUMIN, AST, ALT, ALKPHOS, BILITOT, BILIDIR, IBILI,  in the last 72 hours  Recent Labs  05/18/14 0054  CHOL 184   No results found for this basename: PROTIME,  in the last 72 hours  Imaging:  CTA Chest-Abd: no aortic dissection, coronary atherosclerosis; advanced infrarenal abd Aortic anterosclerosi with ulcerative plaque & possible  focal dissection of L Common Iliac   Cardiac Studies: ECG with more pronounced symmetrical TWI - CRO ischemia  Assessment/Plan:  Principal Problem:   NSTEMI (non-ST elevated myocardial infarction) Active Problems:   Accelerated essential hypertension   Abdominal aortic atherosclerosis   Essential hypertension, benign   Chest pain at rest  CTA last PM negative for Aortic Dissection. Troponin plateaued - no furhter CP. BP still up -- will add ACE-I  Plan iw LHC-Angion +/- PCI today.    LOS: 1 day    Ernestine Rohman W Micahel Omlor 05/18/2014, 7:43 AM     

## 2014-05-18 NOTE — Consult Note (Addendum)
[address]       [place] [address]             [phone]          CARDIOTHORACIC SURGERY CONSULTATION REPORT  PCP is No primary provider on file. Referring Provider is HARDING, Leonie Green, MD   Reason for consultation:  Severe 3-vessel CAD  HPI:   Patient is a 58 year old [place] male with a reported history of hypertension and a family history of CAD, but no other risk factors for coronary artery disease.  The patient was in his usual state of health until last week when he first began to experience intermittent pain in his chest and left arm.  The patient describes the pain as a burning sensation. It was not associated with shortness of breath, nausea, diaphoresis, or lightheadedness. Symptoms waxed and waned for several days until the patient presented to the hospital yesterday. Initial electrocardiograms were notable for the absence of acute ischemic changes, but cardiac enzymes have been positive. CT angiogram of the chest was notable for the absence of pulmonary embolus but confirmed the presence of significant coronary artery calcification as well as significant atherosclerotic disease of the abdominal aorta. The patient underwent diagnostic cardiac catheterization earlier today by Dr. Ellyn Hack. He was found to have severe three-vessel coronary artery disease with preserved left ventricular function. Cardiothoracic surgical consultation was requested.  The patient [place] and does not speak [place].  This entire consultation was conducted in the presence of an interpreter provided by the hospital and the patient's nephew who also speaks both [place] and [place].  The patient is married with 3 grown sons. The majority of his family lives in [place]  The patient has lived locally in [place] with a friend for the past 14 years.  He works as a Radio producer in a Administrator, arts.  He states that he spends the majority  of his time at work. Up until presently he has had no other physical problems or limitations. He was not taking any medications prior to admission and he has not seen a physician for any reason in many years.   Past Medical History  Diagnosis Date  . Hypertension   . NSTEMI (non-ST elevated myocardial infarction) 05/17/2014  . Abdominal aortic atherosclerosis 05/18/2014  . Coronary artery disease 05/18/2014    Past Surgical History  Procedure Laterality Date  . Cardiac catheterization  05/17/2014    DR HARDING    Family History  Problem Relation Age of Onset  . Heart disease Brother     lives in [place]    History   Social History  . Marital Status: Married    Spouse Name: N/A    Number of Children: N/A  . Years of Education: N/A   Occupational History  . Not on file.   Social History Main Topics  . Smoking status: Never Smoker   . Smokeless tobacco: Never Used  . Alcohol Use: No  . Drug Use: No  . Sexual Activity: Not on file   Other Topics Concern  . Not on file   Social History Narrative  . No narrative on file    Prior to Admission medications   Medication Sig Start Date End Date Taking? Authorizing Provider  aspirin 325 MG tablet Take 325 mg by mouth daily as needed (pain).   Yes Historical Provider, MD    Current Facility-Administered Medications  Medication Dose Route Frequency  Provider Last Rate Last Dose  . 0.9 %  sodium chloride infusion   Intravenous Continuous Cecilie Kicks, NP      . 0.9 %  sodium chloride infusion  1 mL/kg/hr Intravenous Continuous Leonie Man, MD      . 0.9 %  sodium chloride infusion  250 mL Intravenous PRN Leonie Man, MD      . Derrill Memo ON 05/19/2014] aspirin EC tablet 81 mg  81 mg Oral Daily Leonie Man, MD      . atorvastatin (LIPITOR) tablet 20 mg  20 mg Oral q1800 Cecilie Kicks, NP   20 mg at 05/17/14 2341  . heparin ADULT infusion 100 units/mL (25000 units/250 mL)  1,050 Units/hr Intravenous Continuous Leonie Man,  MD      . lisinopril (PRINIVIL,ZESTRIL) tablet 5 mg  5 mg Oral Daily Leonie Man, MD   5 mg at 05/18/14 1001  . metoprolol tartrate (LOPRESSOR) tablet 25 mg  25 mg Oral BID Cecilie Kicks, NP   25 mg at 05/18/14 1001  . nitroGLYCERIN (NITROGLYN) 2 % ointment 1 inch  1 inch Topical 4 times per day Cecilie Kicks, NP   1 inch at 05/18/14 0656  . nitroGLYCERIN (NITROSTAT) SL tablet 0.4 mg  0.4 mg Sublingual Q5 Min x 3 PRN Cecilie Kicks, NP      . sodium chloride 0.9 % injection 3 mL  3 mL Intravenous Q12H Cecilie Kicks, NP      . sodium chloride 0.9 % injection 3 mL  3 mL Intravenous PRN Cecilie Kicks, NP      . sodium chloride 0.9 % injection 3 mL  3 mL Intravenous Q12H Leonie Man, MD      . sodium chloride 0.9 % injection 3 mL  3 mL Intravenous PRN Leonie Man, MD        No Known Allergies    Review of Systems:   General:  normal appetite, normal energy, no weight gain, no weight loss, no fever  Cardiac:  + chest pain with exertion, + chest pain at rest, no SOB with  exertion, no resting SOB, no PND, no orthopnea, no palpitations, no arrhythmia, no atrial fibrillation, no LE edema, no dizzy spells, no syncope  Respiratory:  no shortness of breath, no home oxygen, no productive cough, no dry cough, no bronchitis, no wheezing, no hemoptysis, no asthma, no pain with inspiration or cough, no sleep apnea, no CPAP at night  GI:   no difficulty swallowing, no reflux, no frequent heartburn, no hiatal hernia, no abdominal pain, no constipation, no diarrhea, no hematochezia, no hematemesis, no melena  GU:   no dysuria,  no frequency, no urinary tract infection, no hematuria, no enlarged prostate, no kidney stones, no kidney disease  Vascular:  no pain suggestive of claudication, no pain in feet, no leg cramps, no varicose veins, non DVT, o non-healing foot ulcer  Neuro:   no stroke, non TIA's, no seizures, no headaches, no temporary blindness one eye,  no slurred speech, no peripheral neuropathy,  no chronic pain, no instability of gait, no memory/cognitive dysfunction  Musculoskeletal: no arthritis, no joint swelling, no myalgias, no difficulty walking, normal mobility   Skin:   no rash, + itching, no skin infections, no pressure sores or ulcerations  Psych:   no anxiety, no depression, no nervousness, no unusual recent stress  Eyes:   no blurry vision, no floaters, no recent vision changes, no wears glasses or contacts  ENT:  no hearing loss, no loose or painful teeth, no dentures  Hematologic:  no easy bruising, no abnormal bleeding, no clotting disorder, no frequent epistaxis  Endocrine:  No known diabetes, does not check CBG's at home     Physical Exam:   BP 167/90  Pulse 76  Temp(Src) 98.2 F (36.8 C) (Oral)  Resp 20  Ht 5' 7" (1.702 m)  Wt 79 kg (174 lb 2.6 oz)  BMI 27.27 kg/m2  SpO2 98%  General:    well-appearing  HEENT:  Unremarkable   Neck:   no JVD, no bruits, no adenopathy   Chest:   clear to auscultation, symmetrical breath sounds, no wheezes, no rhonchi   CV:   RRR, no murmur   Abdomen:  soft, non-tender, no masses   Extremities:  warm, well-perfused, pulses palpable, no lower extremity edema  Rectal/GU  Deferred  Neuro:   Grossly non-focal and symmetrical throughout  Skin:   Clean and dry, no rashes, no breakdown  Diagnostic Tests:  CARDIAC CATHETERIZATION REPORT   NAMEAdil Howe MRN: [id number]  DOB: 1956-12-09 ADMIT DATE: 05/17/2014  Procedure Date: 05/18/2014  INTERVENTIONAL CARDIOLOGIST: Leonie Man, M.D., MS  PRIMARY CARE PROVIDER: No primary provider on file.  PRIMARY CARDIOLOGIST: New to [facility]; Leonie Man, MD  PATIENT: Roger Howe is a 58 y.o. [place] male with a family history of a brother having CAD in his 63s, and previously untreated hypertension, who was admitted with a non-STEMI on 05/17/2014. This is after several weeks of unstable angina/crescendo angina symptoms. On the 18th yet she had resting symptoms as opposed  exertional symptoms of chest pressure and dyspnea. He ruled in for positive troponins. His also noted to be profoundly hypertensive with accelerated hypertension. A CTA of the chest and abdomen revealed bowel aortic disease as well as iliac disease but no dissection.  He is now referred for invasive evaluation with cardiac catheterization plus minus PCI.  PRE-OPERATIVE DIAGNOSIS:  Non-STEMI PROCEDURES PERFORMED:  Left Heart Catheterization with Coronary Angiography PROCEDURE:Consent: Risks of procedure as well as the alternatives and risks of each were explained to the (patient/caregiver). Consent for procedure obtained.  Consent for signed by MD and patient with RN witness -- placed on chart. Initially the son acted as interpreter, however on day of the procedure there was a certified interpreter present to ensure all questions were answered and all the risks and benefits were explained.  PROCEDURE: The patient was brought to the [facility] in the fasting state and prepped and draped in the usual sterile fashion for Right groin or radial access. A modified Allen's test with plethysmography was performed, revealing excellent Ulnar artery collateral flow. Sterile technique was used including antiseptics, cap, gloves, gown, hand hygiene, mask and sheet. Skin prep: Chlorhexidine.  Time Out: Verified patient identification, verified procedure, site/side was marked, verified correct patient position, special equipment/implants available, medications/allergies/relevent history reviewed, required imaging and test results available. Performed  Access: Right Radial Artery; 5 Fr Sheath -- Seldinger technique (Angiocath Micropuncture Kit)  IA Radial Cocktail - 10 mL, IV Heparin 4000 Units Diagnostic Left Heart Catheterization with Coronary Angiography: 5 Fr TIG 4.0 and Angled Pigtail catheters advanced and exchanged over Lon Exchange Safety J-wire into the ascending aorta and  used for selective coronary artery engagement.  Left and Right Coronary Artery Angiography: TIG 4.0  LV Hemodynamics (LV Gram): Angled pigtail TR Band: 1105 Hours, 12 mL air  Hemodynamics:  Central Aortic / Mean Pressures: 101/60/77 mmHg;  Left Ventricular Pressures / EDP: 100/0/4 mmHg; Left Ventriculography:  EF: Roughly 50-55 %  Wall Motion: Essentially normal, with no regional abnormalities identified Coronary Anatomy:  Dominance: Right Left Main: Large-caliber vessel that bifurcates into the LAD, and Circumflex. Angiographically normal. LAD: Normal/large-caliber vessel with proximal tapering to a subtotal 95-99% stenosis at a septal perforator. There is TIMI 2 flow distally with competitive flow from right to left and left to left collaterals. Does appear to be segmental disease in the mid vessel of likely 50-60% . There appear to be several small diagonal branches, due to the slow flow, unable to do to leak identified any significant branches. There is distal retrograde filling from the large lateral OM giving collaterals distally as well as on the RCA leading to significant competitive flow.  Left Circumflex: Large-caliber vessel that gives off a moderate caliber AV groove circumflex before terminating as a large lateral OM that bifurcates distally into a smaller. and larger inferior reaches down to the apex giving left to left collaterals to the distal LAD. The AV groove circumflex gives off a small OM 2, and has a focal 95% stenosis just prior to the bifurcation and OM 2 and a very small distal OM 3.  Ramus intermedius: Moderate caliber vessel that has an appearance of being either a high OM versus a ramus. She'll 50% followed by focal proximal and 80-90% stenosis.  RCA: Very large caliber, dominant vessel there is intermittent ectatic common relatively free of disease. Bifurcates distally into the right posterior descending artery (RPDA) and the Right Posterior AV Groove Branch (RPAV).    RPDA: Large-caliber vessel with focal ostial 95% stenosis the there are diffuse luminal irregularities in the proximal segment, but distally the vessel reaches the apex and a tortuous manner with minimal luminal irregularities and as it tapered to a very tip there are septal perforator collaterals to the LAD which also demonstrates diagonal branch. The entire course of the LAD is visualized.  RPL Sysytem:The RPAV large-caliber vessel with mild irregularities at the bifurcation of roughly 20%. It gives off bifurcating smaller moderate caliber LPL 1/2 before terminating as a very large, extensive tortuous LPL 3 there has a smaller branch. After reviewing the initial angiography, the culprit lesion was thought to be the proximal subtotally occluded LAD, however in light of the mid LAD lesion, ramus, circumflex and ostial PDA lesions, the decision was made to stop and reassess with possible CABG versus multisite PCI.Marland Kitchen  MEDICATIONS:  Anesthesia: Local Lidocaine 2 ml Sedation: 1 mg IV Versed, 25 mcg IV fentanyl ;  Omnipaque Contrast: 145 ml  Anticoagulation: IV Heparin 4000 Unit Radial Cocktail: 5 mg Verapamil, 400 mcg NTG, 2 ml 2% Lidocaine in 10 ml NS PATIENT DISPOSITION:  The patient was transferred to the PACU holding area in a hemodynamicaly stable, chest pain free condition.  The patient tolerated the procedure well, and there were no complications. EBL: < 5 ml  The patient was stable before, during, and after the procedure. POST-OPERATIVE DIAGNOSIS:  Severe multivessel CAD including proximal LAD, proximal ramus intermedius, ostial PDA, mid AV groove circumflex lesions at least 80-95% stenosis. LAD is essentially subtotally occluded with of left to left and right-to-left collaterals filling the entire LAD  Each individual lesion is CI minimal, however with multivessel CAD, CABG needs to be considered Mildly reduced EF with normal filling pressures. PLAN OF CARE:  Return to [facility] for post radial cath  care. We will restart heparin 6 hours TR band removal  CT Surgery  consultation has been called - to discuss options of CABG versus multisite PCI. He will need an interpreter  If blood pressure climbs back up, will restart nitroglycerin Leonie Man, M.D., M.S.  [facility]  [address] [place],  [address]  [phone]  05/18/2014  11:11 AM    CT ANGIOGRAPHY CHEST  CT ABDOMEN AND PELVIS WITH CONTRAST   TECHNIQUE:  Multidetector CT imaging of the chest was performed using the  standard protocol during bolus administration of intravenous  contrast. Multiplanar CT image reconstructions and MIPs were  obtained to evaluate the vascular anatomy. Multidetector CT imaging  of the abdomen and pelvis was performed using the standard protocol  during bolus administration of intravenous contrast. Precontrast  images were also obtained of the chest.  CONTRAST: 196m OMNIPAQUE IOHEXOL 350 MG/ML SOLN  COMPARISON: Plain film earlier today. No prior CT.  FINDINGS:  CT CHEST FINDINGS  Lungs/Pleura: Mild motion degradation. Clear lungs. No pleural  fluid.  Heart/Mediastinum: No evidence of intramural hematoma on precontrast  images. Post-contrast images demonstrate no central pulmonary  embolism. Atherosclerosis within the the great vessels, most  apparent on the left common carotid artery. No evidence of  dissection. Thoracic aorta demonstrates atherosclerosis, but no  aneurysm or dissection.  Mild cardiomegaly, without pericardial effusion. Coronary artery  atherosclerosis, including within the LAD. No mediastinal or hilar  adenopathy.  CT ABDOMEN AND PELVIS FINDINGS  Abdomen/Pelvis: Normal liver, spleen, stomach, pancreas,  gallbladder, biliary tract, adrenal glands, kidneys.  Widely patent celiac and superior mesenteric arteries. Both renal  arteries are widely patent. Infrarenal aorta demonstrates  atherosclerotic irregularity and probable  ulcerative plaque. No  retroperitoneal or retrocrural adenopathy.  Normal colon, appendix, and terminal ileum. Normal small bowel  without abdominal ascites. No pelvic adenopathy. Normal urinary  bladder and prostate. No significant free fluid.  Moderate and age advanced atherosclerosis involving the pelvic  vasculature. Ulcerative plaque versus focal dissection involving the  left common iliac on image 223/series 5. Extensive ulcerative plaque  involving the right internal iliac in image 234/series 5. Both  internal iliacs are patent.  Bones/Musculoskeletal: L3-4 and L4-5 advanced degenerative disc  disease.  Review of the MIP images confirms the above findings.  IMPRESSION:  1. No evidence of aortic dissection or aneurysm.  2. Coronary artery atherosclerosis.  3. Advanced infrarenal abdominal aortic and pelvic atherosclerosis.  Ulcerative plaque, with possible focal dissection involving the left  common iliac artery.  Electronically Signed  By: KAbigail MiyamotoM.D.  On: 05/17/2014 19:13   Impression:  Patient has severe three-vessel coronary artery disease with preserved left ventricular function.  He presents with acute coronary syndrome and has ruled in by serial enzymes for an acute non-ST segment elevation myocardial infarction.  Although multivessel percutaneous coronary intervention could potentially be feasible, I feel the patient would best be treated with surgical revascularization. The high-grade stenosis in the distal right coronary artery involves a large bifurcation with a large right dominant coronary system which might put the patient at considerable risk for both procedural complications and/or premature recurrence of symptomatic ischemic heart disease.  Risks associated with coronary artery bypass grafting should be very low.    Plan:  I have reviewed the indications, risks, and potential benefits of coronary artery bypass grafting with the patient and his nephew with  the assistance of an interpreter.  Alternative treatment strategies have been discussed including medical therapy, percutaneous coronary intervention, and surgical revascularization.  Expectations for his postoperative  convalescence have been discussed in detail.  The patient understands and accepts all potential associated risks of surgery including but not limited to risk of death, stroke or other neurologic complication, myocardial infarction, congestive heart failure, respiratory failure, renal failure, bleeding requiring blood transfusion and/or reexploration, aortic dissection or other major vascular complication, arrhythmia, heart block or bradycardia requiring permanent pacemaker, pneumonia, pleural effusion, wound infection, pulmonary embolus or other thromboembolic complication, chronic pain or other delayed complications related to median sternotomy, or the late recurrence of symptomatic ischemic heart disease and/or congestive heart failure.  The importance of long term risk modification have been emphasized.  All questions answered.  The patient desires to discuss matters further with the remainder of his family before making a final decision. However, we will tentatively plan for surgery on Thursday, 05/20/2014.   I spent in excess of 120 minutes during the conduct of this hospital consultation and >50% of this time involved direct face-to-face encounter for counseling and/or coordination of the patient's care.   Valentina Gu. Roxy Manns, MD 05/18/2014 3:53 PM

## 2014-05-18 NOTE — Consult Note (Signed)
ANTICOAGULATION CONSULT NOTE Pharmacy Consult for Heparin Indication: chest pain/ACS  No Known Allergies  Patient Measurements: Height: 5\' 7"  (170.2 cm) Weight: 174 lb 2.6 oz (79 kg) IBW/kg (Calculated) : 66.1 Heparin Dosing Weight: 66kg  Vital Signs: Temp: 97.9 F (36.6 C) (05/19 0001) Temp src: Oral (05/19 0001) BP: 179/108 mmHg (05/19 0001) Pulse Rate: 62 (05/19 0001)  Labs:  Recent Labs  05/17/14 1322 05/17/14 1340 05/17/14 2200 05/18/14 0054  HGB 14.3 15.0  --  13.4  HCT 40.6 44.0  --  39.5  PLT 257  --   --  294  LABPROT  --   --   --  14.7  INR  --   --   --  1.17  HEPARINUNFRC  --   --   --  0.18*  CREATININE  --  1.00  --  0.85  TROPONINI  --   --  1.20*  --     Estimated Creatinine Clearance: 87.5 ml/min (by C-G formula based on Cr of 0.85).  Assessment: 58 y.o. male with chest pain for heparin   Goal of Therapy:  Heparin level 0.3-0.7 units/ml Monitor platelets by anticoagulation protocol: Yes   Plan:  Heparin 2000 units IV bolus, then increase heparin 1050 units/hr  [employer] Chidinma Clites 05/18/2014,2:34 AM

## 2014-05-18 NOTE — Progress Notes (Signed)
ANTICOAGULATION CONSULT NOTE - Follow Up Consult  Pharmacy Consult for heparin Indication: chest pain/ACS  No Known Allergies  Patient Measurements: Height: 5\' 7"  (170.2 cm) Weight: 174 lb 2.6 oz (79 kg) IBW/kg (Calculated) : 66.1 Heparin Dosing Weight: 66 kg  Vital Signs: Temp: 98.1 F (36.7 C) (05/19 0700) Temp src: Oral (05/19 0700) BP: 171/88 mmHg (05/19 0700) Pulse Rate: 64 (05/19 0700)  Labs:  Recent Labs  05/17/14 1322 05/17/14 1340 05/17/14 2200 05/18/14 0054 05/18/14 0426  HGB 14.3 15.0  --  13.4  --   HCT 40.6 44.0  --  39.5  --   PLT 257  --   --  294  --   LABPROT  --   --   --  14.7  --   INR  --   --   --  1.17  --   HEPARINUNFRC  --   --   --  0.18*  --   CREATININE  --  1.00  --  0.85  --   TROPONINI  --   --  1.20*  --  1.17*    Estimated Creatinine Clearance: 87.5 ml/min (by C-G formula based on Cr of 0.85).   Medications:  Scheduled:  . aspirin  81 mg Oral Pre-Cath  . [START ON 05/19/2014] aspirin EC  81 mg Oral Daily  . atorvastatin  20 mg Oral q1800  . lisinopril  5 mg Oral Daily  . metoprolol tartrate  25 mg Oral BID  . nitroGLYCERIN  1 inch Topical 4 times per day  . sodium chloride  3 mL Intravenous Q12H   Infusions:  . sodium chloride Stopped (05/17/14 2200)  . sodium chloride 1 mL/kg/hr (05/17/14 2344)  . heparin 1,050 Units/hr (05/18/14 0249)    Assessment: 58 yo male with chest pain is currently on therapeutic heparin.  Heparin level was 0.33. Goal of Therapy:  Heparin level 0.3-0.7 units/ml Monitor platelets by anticoagulation protocol: Yes   Plan:  1) Continue heparin at 1050 units/hr. F/u after cath  Tsz-Yin Kelcee Bjorn 05/18/2014,9:29 AM

## 2014-05-18 NOTE — CV Procedure (Signed)
CARDIAC CATHETERIZATION REPORT  NAME:  Roger Howe   MRN: [id number] DOB:  17-Apr-1956   ADMIT DATE: 05/17/2014 Procedure Date: 05/18/2014  INTERVENTIONAL CARDIOLOGIST: Leonie Man, M.D., MS PRIMARY CARE PROVIDER: No primary provider on file. PRIMARY CARDIOLOGIST: New to [facility]; Leonie Man, MD  PATIENT:  Roger Howe is a 58 y.o. [place] male with a family history of a brother having CAD in his 75s, and previously untreated hypertension, who was admitted with a non-STEMI on 05/17/2014. This is after several weeks of unstable angina/crescendo angina symptoms. On the 18th yet she had resting symptoms as opposed exertional symptoms of chest pressure and dyspnea. He ruled in for positive troponins. His also noted to be profoundly hypertensive with accelerated hypertension. A CTA of the chest and abdomen revealed bowel aortic disease as well as iliac disease but no dissection. He is now referred for invasive evaluation with cardiac catheterization plus minus PCI.  PRE-OPERATIVE DIAGNOSIS:    Non-STEMI  PROCEDURES PERFORMED:    Left Heart Catheterization with Coronary Angiography  PROCEDURE:Consent:  Risks of procedure as well as the alternatives and risks of each were explained to the (patient/caregiver).  Consent for procedure obtained. Consent for signed by MD and patient with RN witness -- placed on chart. Initially the son acted as interpreter, however on day of the procedure there was a certified interpreter present to ensure all questions were answered and all the risks and benefits were explained.  PROCEDURE: The patient was brought to the 2nd [place] [facility] in the fasting state and prepped and draped in the usual sterile fashion for Right groin or radial access. A modified Allen's test with plethysmography was performed, revealing excellent Ulnar artery collateral flow.  Sterile technique was used including antiseptics, cap, gloves, gown, hand  hygiene, mask and sheet.  Skin prep: Chlorhexidine.  Time Out: Verified patient identification, verified procedure, site/side was marked, verified correct patient position, special equipment/implants available, medications/allergies/relevent history reviewed, required imaging and test results available.  Performed  Access: Right Radial Artery; 5 Fr Sheath -- Seldinger technique (Angiocath Micropuncture Kit)  IA Radial Cocktail - 10 mL, IV Heparin 4000 Units  Diagnostic Left Heart Catheterization with Coronary Angiography:  5 Fr TIG 4.0 and Angled Pigtail catheters advanced and exchanged over Lon Exchange Safety J-wire into the ascending aorta and used for selective coronary artery engagement.  Left and Right Coronary Artery Angiography: TIG 4.0  LV Hemodynamics (LV Gram): Angled pigtail  TR Band:  1105 Hours, 12 mL air  Hemodynamics:  Central Aortic / Mean Pressures: 101/60/77 mmHg;  Left Ventricular Pressures / EDP: 100/0/4 mmHg;  Left Ventriculography:  EF: Roughly 50-55 %  Wall Motion: Essentially normal, with no regional abnormalities identified  Coronary Anatomy:  Dominance: Right  Left Main: Large-caliber vessel that bifurcates into the LAD, and Circumflex. Angiographically normal. LAD: Normal/large-caliber vessel with proximal tapering to a subtotal 95-99% stenosis at a septal perforator. There is TIMI 2 flow distally with competitive flow from right to left and left to left collaterals.  Does appear to be segmental disease in the mid vessel of likely 50-60% .  There appear to be several small diagonal branches, due to the slow flow, unable to do to leak identified any significant branches.  There is distal retrograde filling from the large lateral OM giving collaterals distally as well as on the RCA leading to significant competitive flow.  Left Circumflex: Large-caliber vessel that gives off a moderate caliber AV groove circumflex before terminating  as a large lateral OM  that bifurcates distally into a smaller. and larger inferior reaches down to the apex giving left to left collaterals to the distal LAD.   The AV groove circumflex gives off a small OM 2, and has a focal 95% stenosis just prior to the bifurcation and OM 2 and a very small distal OM 3.  Ramus intermedius: Moderate caliber vessel that has an appearance of being either a high OM versus a ramus. She'll 50% followed by focal proximal and 80-90% stenosis.   RCA: Very large caliber, dominant vessel there is intermittent ectatic common relatively free of disease. Bifurcates distally into the right posterior descending artery (RPDA) and the  Right Posterior AV Groove Branch (RPAV).  RPDA: Large-caliber vessel with focal ostial 95% stenosis the there are diffuse luminal irregularities in the proximal segment, but distally the vessel reaches the apex and a tortuous manner with minimal luminal irregularities and as it tapered to a very tip there are septal perforator collaterals to the LAD which also demonstrates diagonal branch. The entire course of the LAD is visualized.  RPL Sysytem:The RPAV large-caliber vessel with mild irregularities at the bifurcation of roughly 20%. It gives off bifurcating smaller moderate caliber LPL 1/2 before terminating as a very large, extensive tortuous LPL 3 there has a smaller branch.  After reviewing the initial angiography, the culprit lesion was thought to be the proximal subtotally occluded LAD, however in light of the mid LAD lesion, ramus, circumflex and ostial PDA lesions, the decision was made to stop and reassess with possible CABG versus multisite PCI.Marland Kitchen   MEDICATIONS:  Anesthesia:  Local Lidocaine 2 ml  Sedation:  1 mg IV Versed, 25 mcg IV fentanyl ;   Omnipaque Contrast: 145 ml  Anticoagulation:  IV Heparin 4000 Unit Radial Cocktail: 5 mg Verapamil, 400 mcg NTG, 2 ml 2% Lidocaine in 10 ml NS  PATIENT DISPOSITION:    The patient was transferred to the PACU  holding area in a hemodynamicaly stable, chest pain free condition.  The patient tolerated the procedure well, and there were no complications.  EBL:   < 5 ml  The patient was stable before, during, and after the procedure.  POST-OPERATIVE DIAGNOSIS:    Severe multivessel CAD including proximal LAD, proximal ramus intermedius, ostial PDA, mid AV groove circumflex lesions at least 80-95% stenosis. LAD is essentially subtotally occluded with of left to left and right-to-left collaterals filling the entire LAD  Each individual lesion is CI minimal, however with multivessel CAD, CABG needs to be considered  Mildly reduced EF with normal filling pressures.  PLAN OF CARE:  Return to [facility] for post radial cath care. We will restart heparin 6 hours TR band removal  CT Surgery consultation has been called - to discuss options of CABG versus multisite PCI. He will need an interpreter  If blood pressure climbs back up, will restart nitroglycerin   Leonie Man, M.D., M.S. [facility] [address] [place], [place]  [address]  [phone]  05/18/2014 11:11 AM

## 2014-05-18 NOTE — Progress Notes (Signed)
TR BAND REMOVAL  LOCATION:    right radial  DEFLATED PER PROTOCOL:    yes  TIME BAND OFF / DRESSING APPLIED:    1400   SITE UPON ARRIVAL:    Level 0  SITE AFTER BAND REMOVAL:    Level 0  REVERSE ALLEN'S TEST:     positive  CIRCULATION SENSATION AND MOVEMENT:    Within Normal Limits   yes  COMMENTS:   Rechecked at 1430 with no change in assessment and dressing dry and intact

## 2014-05-18 NOTE — Progress Notes (Signed)
ANTICOAGULATION CONSULT NOTE - Follow Up Consult  Pharmacy Consult for heparin Indication: NSTEMI  No Known Allergies  Patient Measurements: Height: 5\' 7"  (170.2 cm) Weight: 174 lb 2.6 oz (79 kg) IBW/kg (Calculated) : 66.1 Heparin Dosing Weight:   Vital Signs: Temp: 98.2 F (36.8 C) (05/19 0958) Temp src: Oral (05/19 0958) BP: 167/90 mmHg (05/19 1000) Pulse Rate: 76 (05/19 1015)  Labs:  Recent Labs  05/17/14 1322 05/17/14 1340 05/17/14 2200 05/18/14 0054 05/18/14 0426 05/18/14 0945  HGB 14.3 15.0  --  13.4  --   --   HCT 40.6 44.0  --  39.5  --   --   PLT 257  --   --  294  --   --   LABPROT  --   --   --  14.7  --   --   INR  --   --   --  1.17  --   --   HEPARINUNFRC  --   --   --  0.18*  --  0.33  CREATININE  --  1.00  --  0.85  --   --   TROPONINI  --   --  1.20*  --  1.17* 0.88*    Estimated Creatinine Clearance: 87.5 ml/min (by C-G formula based on Cr of 0.85).   Medications:  Scheduled:  . [START ON 05/19/2014] aspirin EC  81 mg Oral Daily  . atorvastatin  20 mg Oral q1800  . lisinopril  5 mg Oral Daily  . metoprolol tartrate  25 mg Oral BID  . nitroGLYCERIN  1 inch Topical 4 times per day  . sodium chloride  3 mL Intravenous Q12H  . sodium chloride  3 mL Intravenous Q12H   Infusions:  . sodium chloride Stopped (05/17/14 2200)  . sodium chloride      Assessment: 58 yo male s/p cath will be resumed on heparin 6hrs post TR band removal.  TR band is removed at 1415 per RN.  CVTS consult per cath note.  Goal of Therapy:  Heparin level 0.3-0.7 units/ml Monitor platelets by anticoagulation protocol: Yes   Plan:  1) Restart heparin at 1050 units/hr at 2015 tonight. 2) 6hrs heparin level  Tsz-Yin Yuritzi Kamp 05/18/2014,2:15 PM

## 2014-05-18 NOTE — Interval H&P Note (Signed)
History and Physical Interval Note:  05/18/2014 10:24 AM  Roger Howe  has presented today for surgery, with the diagnosis of NSTEMI.  The various methods of treatment have been discussed with the patient and family. After consideration of risks, benefits and other options for treatment, the patient has consented to  Procedure(s): LEFT HEART CATHETERIZATION WITH CORONARY ANGIOGRAM (N/A) +/- PCI   as a surgical intervention .  The patient's history has been reviewed, patient examined, no change in status, stable for surgery.  I have reviewed the patient's chart and labs.  Questions were answered to the patient's satisfaction.     [name]  Cath Lab Visit (complete for each Cath Lab visit)  Clinical Evaluation Leading to the Procedure:   ACS: yes  Non-ACS:    Anginal Classification: CCS IV  Anti-ischemic medical therapy: Maximal Therapy (2 or more classes of medications)  Non-Invasive Test Results: No non-invasive testing performed  Prior CABG: No previous CABG

## 2014-05-18 NOTE — Progress Notes (Addendum)
     NAMEOracio Howe   MRN: [id number] DOB:  [birth year]   ADMIT DATE: 05/17/2014  Performing MD:  Dr. [name], M.D., M.S.  Procedure:  cardiac cath   The procedure with Risks/Benefits/Alternatives and Indications was reviewed with the patient.  All questions were answered.    Risks / Complications include, but not limited to: Death, MI, CVA/TIA, VF/VT (with defibrillation), Bradycardia (need for temporary pacer placement), contrast induced nephropathy, bleeding / bruising / hematoma / pseudoaneurysm, vascular or coronary injury (with possible emergent CT or Vascular Surgery), adverse medication reactions, infection.    The patient  and family who voice understanding and agree to proceed.   I have signed the consent form and placed it on the chart for patient signature and RN witness.     Thereasa Parkin, PA-C [facility]  05/18/2014 9:38 AM  I talked with the interpreter to ensure taht all ?s were adequately answered.  [name], MD

## 2014-05-18 NOTE — Progress Notes (Signed)
    58 y/o [place] gentleman with h/o untreated HTN presented with acute onset SSCP - ruled in for NSTEMI  Subjective:  No further CP o/n.  No HA  Objective:  Vital Signs in the last 24 hours: Temp:  [97.9 F (36.6 C)-99.2 F (37.3 C)] 98.1 F (36.7 C) (05/19 0700) Pulse Rate:  [57-102] 64 (05/19 0700) Resp:  [15-27] 18 (05/19 0700) BP: (128-188)/(78-110) 171/88 mmHg (05/19 0700) SpO2:  [96 %-100 %] 97 % (05/19 0700) Weight:  [160 lb 7.9 oz (72.8 kg)-174 lb 2.6 oz (79 kg)] 174 lb 2.6 oz (79 kg) (05/19 0001)  Intake/Output from previous day:   Intake/Output from this shift:    Physical Exam: General appearance: alert, cooperative, appears stated age and no distress Neck: no adenopathy, no carotid bruit, no JVD and supple, symmetrical, trachea midline Lungs: clear to auscultation bilaterally and normal percussion bilaterally Heart: regular rate and rhythm, S1, S2 normal, no murmur, click, rub or gallop and normal apical impulse Abdomen: soft, non-tender; bowel sounds normal; no masses,  no organomegaly Extremities: extremities normal, atraumatic, no cyanosis or edema Pulses: 2+ and symmetric Neurologic: Grossly normal  Lab Results:  Recent Labs  05/17/14 1322 05/17/14 1340 05/18/14 0054  WBC 9.4  --  9.2  HGB 14.3 15.0 13.4  PLT 257  --  294    Recent Labs  05/17/14 1340 05/18/14 0054  NA 143 141  K 3.7 3.4*  CL 104 103  CO2  --  27  GLUCOSE 95 98  BUN 8 8  CREATININE 1.00 0.85    Recent Labs  05/17/14 2200 05/18/14 0426  TROPONINI 1.20* 1.17*   Hepatic Function Panel No results found for this basename: PROT, ALBUMIN, AST, ALT, ALKPHOS, BILITOT, BILIDIR, IBILI,  in the last 72 hours  Recent Labs  05/18/14 0054  CHOL 184   No results found for this basename: PROTIME,  in the last 72 hours  Imaging:  CTA Chest-Abd: no aortic dissection, coronary atherosclerosis; advanced infrarenal abd Aortic anterosclerosi with ulcerative plaque & possible  focal dissection of L Common Iliac   Cardiac Studies: ECG with more pronounced symmetrical TWI - CRO ischemia  Assessment/Plan:  Principal Problem:   NSTEMI (non-ST elevated myocardial infarction) Active Problems:   Accelerated essential hypertension   Abdominal aortic atherosclerosis   Essential hypertension, benign   Chest pain at rest  CTA last PM negative for Aortic Dissection. Troponin plateaued - no furhter CP. BP still up -- will add ACE-I  Plan iw LHC-Angion +/- PCI today.    LOS: 1 day    [name] 05/18/2014, 7:43 AM

## 2014-05-19 ENCOUNTER — Other Ambulatory Visit (HOSPITAL_COMMUNITY): Payer: 59

## 2014-05-19 ENCOUNTER — Inpatient Hospital Stay (HOSPITAL_COMMUNITY): Payer: 59

## 2014-05-19 DIAGNOSIS — I517 Cardiomegaly: Secondary | ICD-10-CM

## 2014-05-19 DIAGNOSIS — Z01810 Encounter for preprocedural cardiovascular examination: Secondary | ICD-10-CM

## 2014-05-19 LAB — CBC
HCT: 38.9 % — ABNORMAL LOW (ref 39.0–52.0)
HEMOGLOBIN: 13.2 g/dL (ref 13.0–17.0)
MCH: 27.5 pg (ref 26.0–34.0)
MCHC: 33.9 g/dL (ref 30.0–36.0)
MCV: 81 fL (ref 78.0–100.0)
Platelets: 264 10*3/uL (ref 150–400)
RBC: 4.8 MIL/uL (ref 4.22–5.81)
RDW: 13.7 % (ref 11.5–15.5)
WBC: 8.7 10*3/uL (ref 4.0–10.5)

## 2014-05-19 LAB — URINALYSIS, ROUTINE W REFLEX MICROSCOPIC
BILIRUBIN URINE: NEGATIVE
Glucose, UA: NEGATIVE mg/dL
Hgb urine dipstick: NEGATIVE
KETONES UR: NEGATIVE mg/dL
LEUKOCYTES UA: NEGATIVE
Nitrite: NEGATIVE
PH: 6 (ref 5.0–8.0)
PROTEIN: NEGATIVE mg/dL
Specific Gravity, Urine: 1.033 — ABNORMAL HIGH (ref 1.005–1.030)
Urobilinogen, UA: 1 mg/dL (ref 0.0–1.0)

## 2014-05-19 LAB — BLOOD GAS, ARTERIAL
Acid-base deficit: 0.4 mmol/L (ref 0.0–2.0)
BICARBONATE: 23.9 meq/L (ref 20.0–24.0)
Drawn by: 362771
FIO2: 0.21 %
O2 SAT: 94.2 %
PATIENT TEMPERATURE: 98.6
PH ART: 7.392 (ref 7.350–7.450)
TCO2: 25.1 mmol/L (ref 0–100)
pCO2 arterial: 40.1 mmHg (ref 35.0–45.0)
pO2, Arterial: 70.4 mmHg — ABNORMAL LOW (ref 80.0–100.0)

## 2014-05-19 LAB — COMPREHENSIVE METABOLIC PANEL
ALT: 18 U/L (ref 0–53)
AST: 31 U/L (ref 0–37)
Albumin: 3.8 g/dL (ref 3.5–5.2)
Alkaline Phosphatase: 62 U/L (ref 39–117)
BILIRUBIN TOTAL: 0.6 mg/dL (ref 0.3–1.2)
BUN: 11 mg/dL (ref 6–23)
CHLORIDE: 106 meq/L (ref 96–112)
CO2: 24 meq/L (ref 19–32)
CREATININE: 0.93 mg/dL (ref 0.50–1.35)
Calcium: 9 mg/dL (ref 8.4–10.5)
Glucose, Bld: 95 mg/dL (ref 70–99)
Potassium: 4 mEq/L (ref 3.7–5.3)
Sodium: 142 mEq/L (ref 137–147)
Total Protein: 7.5 g/dL (ref 6.0–8.3)

## 2014-05-19 LAB — HEPARIN LEVEL (UNFRACTIONATED)
Heparin Unfractionated: 0.27 IU/mL — ABNORMAL LOW (ref 0.30–0.70)
Heparin Unfractionated: 0.35 IU/mL (ref 0.30–0.70)

## 2014-05-19 LAB — TYPE AND SCREEN
ABO/RH(D): B POS
ANTIBODY SCREEN: NEGATIVE

## 2014-05-19 LAB — ABO/RH: ABO/RH(D): B POS

## 2014-05-19 MED ORDER — PHENYLEPHRINE HCL 10 MG/ML IJ SOLN
30.0000 ug/min | INTRAVENOUS | Status: DC
  Filled 2014-05-19: qty 2

## 2014-05-19 MED ORDER — EPINEPHRINE HCL 1 MG/ML IJ SOLN
0.5000 ug/min | INTRAVENOUS | Status: DC
  Filled 2014-05-19: qty 4

## 2014-05-19 MED ORDER — DEXTROSE 5 % IV SOLN
750.0000 mg | INTRAVENOUS | Status: DC
  Filled 2014-05-19: qty 750

## 2014-05-19 MED ORDER — POTASSIUM CHLORIDE 2 MEQ/ML IV SOLN
80.0000 meq | INTRAVENOUS | Status: DC
  Filled 2014-05-19: qty 40

## 2014-05-19 MED ORDER — BISACODYL 5 MG PO TBEC
5.0000 mg | DELAYED_RELEASE_TABLET | Freq: Once | ORAL | Status: AC
  Administered 2014-05-19: 21:00:00 5 mg via ORAL
  Filled 2014-05-19: qty 1

## 2014-05-19 MED ORDER — CHLORHEXIDINE GLUCONATE 4 % EX LIQD
60.0000 mL | Freq: Once | CUTANEOUS | Status: AC
  Administered 2014-05-20: 4 via TOPICAL
  Filled 2014-05-19: qty 60

## 2014-05-19 MED ORDER — VANCOMYCIN HCL 10 G IV SOLR
1250.0000 mg | INTRAVENOUS | Status: AC
  Administered 2014-05-20: 1250 mg via INTRAVENOUS
  Filled 2014-05-19: qty 1250

## 2014-05-19 MED ORDER — TEMAZEPAM 15 MG PO CAPS
15.0000 mg | ORAL_CAPSULE | Freq: Once | ORAL | Status: AC | PRN
  Filled 2014-05-19: qty 1

## 2014-05-19 MED ORDER — SODIUM CHLORIDE 0.9 % IV SOLN
INTRAVENOUS | Status: DC
  Filled 2014-05-19: qty 40

## 2014-05-19 MED ORDER — MAGNESIUM SULFATE 50 % IJ SOLN
40.0000 meq | INTRAMUSCULAR | Status: DC
  Filled 2014-05-19: qty 10

## 2014-05-19 MED ORDER — DEXMEDETOMIDINE HCL IN NACL 400 MCG/100ML IV SOLN
0.1000 ug/kg/h | INTRAVENOUS | Status: DC
  Filled 2014-05-19: qty 100

## 2014-05-19 MED ORDER — DEXTROSE 5 % IV SOLN
1.5000 g | INTRAVENOUS | Status: AC
  Administered 2014-05-20: 1.5 g via INTRAVENOUS
  Filled 2014-05-19: qty 1.5

## 2014-05-19 MED ORDER — NITROGLYCERIN IN D5W 200-5 MCG/ML-% IV SOLN
2.0000 ug/min | INTRAVENOUS | Status: DC
  Filled 2014-05-19: qty 250

## 2014-05-19 MED ORDER — HEPARIN (PORCINE) IN NACL 100-0.45 UNIT/ML-% IJ SOLN
1200.0000 [IU]/h | INTRAMUSCULAR | Status: AC
  Filled 2014-05-19: qty 250

## 2014-05-19 MED ORDER — DOPAMINE-DEXTROSE 3.2-5 MG/ML-% IV SOLN
2.0000 ug/kg/min | INTRAVENOUS | Status: DC
Start: 2014-05-20 — End: 2014-05-20
  Filled 2014-05-19: qty 250

## 2014-05-19 MED ORDER — PAPAVERINE HCL 30 MG/ML IJ SOLN
INTRAMUSCULAR | Status: AC
  Administered 2014-05-20: 07:00:00
  Filled 2014-05-19: qty 2.5

## 2014-05-19 MED ORDER — VANCOMYCIN HCL 1000 MG IV SOLR
INTRAVENOUS | Status: AC
  Administered 2014-05-20: 07:00:00
  Filled 2014-05-19: qty 1000

## 2014-05-19 MED ORDER — SODIUM CHLORIDE 0.9 % IV SOLN
INTRAVENOUS | Status: DC
  Filled 2014-05-19: qty 30

## 2014-05-19 MED ORDER — SODIUM CHLORIDE 0.9 % IV SOLN
INTRAVENOUS | Status: DC
  Filled 2014-05-19: qty 1

## 2014-05-19 MED ORDER — METOPROLOL TARTRATE 12.5 MG HALF TABLET
12.5000 mg | ORAL_TABLET | Freq: Once | ORAL | Status: AC
  Administered 2014-05-20: 06:00:00 12.5 mg via ORAL
  Filled 2014-05-19: qty 1

## 2014-05-19 NOTE — Progress Notes (Signed)
  Echocardiogram 2D Echocardiogram has been performed.  Roger Howe 05/19/2014, 4:08 PM

## 2014-05-19 NOTE — Progress Notes (Signed)
ANTICOAGULATION CONSULT NOTE - Follow Up Consult  Pharmacy Consult for heparin Indication: NSTEMI  No Known Allergies  Patient Measurements: Height: 5\' 7"  (170.2 cm) Weight: 175 lb 11.3 oz (79.7 kg) IBW/kg (Calculated) : 66.1 Heparin Dosing Weight: 79.7 kg  Vital Signs: Temp: 98 F (36.7 C) (05/20 0635) Temp src: Oral (05/20 0635) BP: 148/60 mmHg (05/20 0635) Pulse Rate: 65 (05/20 0635)  Labs:  Recent Labs  05/17/14 1322 05/17/14 1340 05/17/14 2200 05/18/14 0054 05/18/14 0426 05/18/14 0945 05/19/14 0915  HGB 14.3 15.0  --  13.4  --   --  13.2  HCT 40.6 44.0  --  39.5  --   --  38.9*  PLT 257  --   --  294  --   --  264  LABPROT  --   --   --  14.7  --   --   --   INR  --   --   --  1.17  --   --   --   HEPARINUNFRC  --   --   --  0.18*  --  0.33 0.27*  CREATININE  --  1.00  --  0.85  --   --  0.93  TROPONINI  --   --  1.20*  --  1.17* 0.88*  --     Estimated Creatinine Clearance: 86.5 ml/min (by C-G formula based on Cr of 0.93).   Medications:  Scheduled:  . aspirin EC  81 mg Oral Daily  . atorvastatin  20 mg Oral q1800  . lisinopril  5 mg Oral Daily  . metoprolol tartrate  25 mg Oral BID  . nitroGLYCERIN  1 inch Topical 4 times per day  . sodium chloride  3 mL Intravenous Q12H  . sodium chloride  3 mL Intravenous Q12H   Infusions:  . sodium chloride Stopped (05/17/14 2200)  . heparin 1,050 Units/hr (05/18/14 2002)    Assessment: 58 yo male with NSTEMI s/p cath yesterday which revealed multivessel CAD . IV heparin was resumed last night 6hrs post TR band removal. CVTS consulted and notes plan for CABG tomorrow AM.    This AM the heparin level is 0.27, subtherapeutic on current heparin rate of 1050 units/hr.  CBC is stable with Hgb 13.2 and PLTC 264k. No bleeding noted.   Goal of Therapy:  Heparin level 0.3-0.7 units/ml Monitor platelets by anticoagulation protocol: Yes   Plan:  Increase heparin at 1200 units/hr to target therapeutic level 0.3-0.7  units/ml. Check 6 hour heparin level.  Heparin drip to be stopped tonight @23 :59 for planned CABG tomorrow AM.    Noah Delaine, RPh Clinical Pharmacist Pager: [phone] 05/19/2014,10:44 AM

## 2014-05-19 NOTE — Care Management Note (Signed)
    Page 1 of 1   05/19/2014     2:49:56 PM CARE MANAGEMENT NOTE 05/19/2014  Patient:  Roger Howe, Roger Howe   Account Number:  [id number]  Date Initiated:  05/19/2014  Documentation initiated by:  [facility]  Subjective/Objective Assessment:   58 y/o [place] gentleman with h/o untreated HTN presented with acute onset SSCP - ruled in for NSTEMI.// Home with friends.     Action/Plan:   Left Heart Catheterization with Coronary Angiography; CABG on 5/21.//Access for Home Health services.   Anticipated DC Date:  05/27/2014   Anticipated DC Plan:  HOME W HOME HEALTH SERVICES  In-house referral  Interpreting Services      DC Planning Services  CM consult      Choice offered to / List presented to:             Status of service:  In process, will continue to follow Medicare Important Message given?   (If response is "NO", the following Medicare IM given date fields will be blank) Date Medicare IM given:   Date Additional Medicare IM given:    Discharge Disposition:    Per UR Regulation:  Reviewed for med. necessity/level of care/duration of stay  If discussed at Long Length of Stay Meetings, dates discussed:    Comments:  05/19/14 1400 Oletta Cohn, RN, BSN, [place] [phone] Spoke to pt and nephew at bedside (via interpreter) regarding discharge planning.  Explained the normal progression of surgery to ICU to Cardiac Care Unit and from there, Home Health services and/or DMEs would be arranged. Pt and nephew verbalized understanding.  NCM will continue to follow.

## 2014-05-19 NOTE — Progress Notes (Signed)
ANTICOAGULATION CONSULT NOTE - Follow Up Consult  Pharmacy Consult for heparin Indication: NSTEMI  No Known Allergies  Patient Measurements: Height: 5\' 7"  (170.2 cm) Weight: 175 lb 11.3 oz (79.7 kg) IBW/kg (Calculated) : 66.1 Heparin Dosing Weight: 79.7 kg  Vital Signs: Temp: 98.1 F (36.7 C) (05/20 2021) Temp src: Oral (05/20 2021) BP: 155/79 mmHg (05/20 2021) Pulse Rate: 67 (05/20 2021)  Labs:  Recent Labs  05/17/14 1322 05/17/14 1340 05/17/14 2200  05/18/14 0054 05/18/14 0426 05/18/14 0945 05/19/14 0915 05/19/14 1946  HGB 14.3 15.0  --   --  13.4  --   --  13.2  --   HCT 40.6 44.0  --   --  39.5  --   --  38.9*  --   PLT 257  --   --   --  294  --   --  264  --   LABPROT  --   --   --   --  14.7  --   --   --   --   INR  --   --   --   --  1.17  --   --   --   --   HEPARINUNFRC  --   --   --   < > 0.18*  --  0.33 0.27* 0.35  CREATININE  --  1.00  --   --  0.85  --   --  0.93  --   TROPONINI  --   --  1.20*  --   --  1.17* 0.88*  --   --   < > = values in this interval not displayed.  Estimated Creatinine Clearance: 86.5 ml/min (by C-G formula based on Cr of 0.93).   Medications:  Scheduled:  . [START ON 05/20/2014] aminocaproic acid (AMICAR) for OHS   Intravenous To OR  . aspirin EC  81 mg Oral Daily  . atorvastatin  20 mg Oral q1800  . bisacodyl  5 mg Oral Once  . [START ON 05/20/2014] cefUROXime (ZINACEF)  IV  1.5 g Intravenous To OR  . [START ON 05/20/2014] cefUROXime (ZINACEF)  IV  750 mg Intravenous To OR  . chlorhexidine  60 mL Topical Once   And  . [START ON 05/20/2014] chlorhexidine  60 mL Topical Once  . [START ON 05/20/2014] dexmedetomidine  0.1-0.7 mcg/kg/hr Intravenous To OR  . [START ON 05/20/2014] DOPamine  2-20 mcg/kg/min Intravenous To OR  . [START ON 05/20/2014] epinephrine  0.5-20 mcg/min Intravenous To OR  . [START ON 05/20/2014] heparin-papaverine-plasmalyte irrigation   Irrigation To OR  . [START ON 05/20/2014] heparin 30,000 units/NS 1000 mL  solution for CELLSAVER   Other To OR  . [START ON 05/20/2014] insulin (NOVOLIN-R) infusion   Intravenous To OR  . lisinopril  5 mg Oral Daily  . [START ON 05/20/2014] magnesium sulfate  40 mEq Other To OR  . [START ON 05/20/2014] metoprolol tartrate  12.5 mg Oral Once  . metoprolol tartrate  25 mg Oral BID  . nitroGLYCERIN  1 inch Topical 4 times per day  . [START ON 05/20/2014] nitroGLYCERIN  2-200 mcg/min Intravenous To OR  . [START ON 05/20/2014] phenylephrine (NEO-SYNEPHRINE) Adult infusion  30-200 mcg/min Intravenous To OR  . [START ON 05/20/2014] potassium chloride  80 mEq Other To OR  . sodium chloride  3 mL Intravenous Q12H  . sodium chloride  3 mL Intravenous Q12H  . [START ON 05/20/2014] vancomycin 1000 mg in NS (1000 ml)  irrigation for Dr. [name] case   Irrigation To OR  . [START ON 05/20/2014] vancomycin  1,250 mg Intravenous To OR   Infusions:  . sodium chloride Stopped (05/17/14 2200)  . heparin 1,200 Units/hr (05/19/14 1112)    Assessment: 58 yo male with NSTEMI s/p cath yesterday which revealed multivessel CAD . IV heparin was resumed last night 6hrs post TR band removal. CVTS consulted and notes plan for CABG. Heparin level 0.35 in goal range.   Goal of Therapy:  Heparin level 0.3-0.7 units/ml Monitor platelets by anticoagulation protocol: Yes   Plan:  Continue heparin at 1200 units/hr  Heparin drip to be stopped tonight @23 :59 for planned CABG tomorrow AM.    Malachi Carl. Merilynn Finland, PharmD, BCPS Clinical Staff Pharmacist Pager [phone]   05/19/2014,8:53 PM

## 2014-05-19 NOTE — Progress Notes (Addendum)
VASCULAR LAB PRELIMINARY  PRELIMINARY  PRELIMINARY  PRELIMINARY  Pre-op Cardiac Surgery  Carotid Findings:  Bilateral:  1-39% ICA stenosis.  Vertebral artery flow is antegrade.     Thereasa Parkin, RVT 05/19/2014 7:32 PM    Upper Extremity Right Left  Brachial Pressures 160 Triphasic 159 Triphasic  Radial Waveforms Triphasic Triphasic  Ulnar Waveforms Triphasic Triphasic  Palmar Arch (Allen's Test) Normal  Normal    Findings:      Lower  Extremity Right Left  Dorsalis Pedis    Anterior Tibial    Posterior Tibial    Ankle/Brachial Indices      Findings:  Palpable pulses X 4

## 2014-05-19 NOTE — Progress Notes (Signed)
       Patient Name: Roger Howe Date of Encounter: 05/19/2014    SUBJECTIVE: Language barrier. No CP over night according to nephew.  TELEMETRY:  NSR: Filed Vitals:   05/18/14 1800 05/18/14 2122 05/19/14 0013 05/19/14 0635  BP: 166/90 132/60 124/59 148/60  Pulse: 69 69 63 65  Temp:  98.2 F (36.8 C) 97.6 F (36.4 C) 98 F (36.7 C)  TempSrc:  Oral Oral Oral  Resp:  15 17 20   Height:      Weight:   175 lb 11.3 oz (79.7 kg)   SpO2: 98% 95% 95% 95%    Intake/Output Summary (Last 24 hours) at 05/19/14 0741 Last data filed at 05/18/14 1530  Gross per 24 hour  Intake    440 ml  Output    600 ml  Net   -160 ml   LABS: Basic Metabolic Panel:  Recent Labs  16/07/37 1340 05/18/14 0054  NA 143 141  K 3.7 3.4*  CL 104 103  CO2  --  27  GLUCOSE 95 98  BUN 8 8  CREATININE 1.00 0.85  CALCIUM  --  8.9  MG  --  2.0   CBC:  Recent Labs  05/17/14 1322 05/17/14 1340 05/18/14 0054  WBC 9.4  --  9.2  NEUTROABS 6.8  --   --   HGB 14.3 15.0 13.4  HCT 40.6 44.0 39.5  MCV 79.6  --  80.6  PLT 257  --  294   Cardiac Enzymes:  Recent Labs  05/17/14 2200 05/18/14 0426 05/18/14 0945  TROPONINI 1.20* 1.17* 0.88*   BNP: No components found with this basename: POCBNP,  Hemoglobin A1C:  Recent Labs  05/18/14 0054  HGBA1C 5.7*   Fasting Lipid Panel:  Recent Labs  05/18/14 0054  CHOL 184  HDL 27*  LDLCALC 125*  TRIG 162*  CHOLHDL 6.8    Radiology/Studies:  No new data. Mild cardiac enlargement  Physical Exam: Blood pressure 148/60, pulse 65, temperature 98 F (36.7 C), temperature source Oral, resp. rate 20, height 5\' 7"  (1.702 m), weight 175 lb 11.3 oz (79.7 kg), SpO2 95.00%. Weight change: 15 lb 3.4 oz (6.9 kg)  Wt Readings from Last 3 Encounters:  05/19/14 175 lb 11.3 oz (79.7 kg)  05/19/14 175 lb 11.3 oz (79.7 kg)  11/05/08 160 lb 8 oz (72.802 kg)    S4 gallop No edema Chest clear.  ASSESSMENT:  1. 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 Language barrier 3. Hypertension  Plan:  1. CABG per Dr. [name] 2. Questions answered  Signed, Lyn Records III 05/19/2014, 7:41 AM

## 2014-05-19 NOTE — Progress Notes (Signed)
      [address]       [place] [address]             [phone]     CARDIOTHORACIC SURGERY PROGRESS NOTE  1 Day Post-Op  S/P Procedure(s) (LRB): LEFT HEART CATHETERIZATION WITH CORONARY ANGIOGRAM (N/A)  Subjective: No chest pain, SOB  Objective: Vital signs in last 24 hours: Temp:  [97.6 F (36.4 C)-98.2 F (36.8 C)] 98 F (36.7 C) (05/20 0635) Pulse Rate:  [55-76] 65 (05/20 0635) Cardiac Rhythm:  [-] Normal sinus rhythm (05/19 2030) Resp:  [15-20] 20 (05/20 0635) BP: (111-167)/(59-94) 148/60 mmHg (05/20 0635) SpO2:  [94 %-100 %] 95 % (05/20 0635) Weight:  [79.7 kg (175 lb 11.3 oz)] 79.7 kg (175 lb 11.3 oz) (05/20 0013)  Physical Exam:  Rhythm:   sinus  Breath sounds: clear  Heart sounds:  RRR  Incisions:  n/a  Abdomen:  soft  Extremities:  warm   Intake/Output from previous day: 05/19 0701 - 05/20 0700 In: 440 [P.O.:440] Out: 600 [Urine:600] Intake/Output this shift:    Lab Results:  Recent Labs  05/17/14 1322 05/17/14 1340 05/18/14 0054  WBC 9.4  --  9.2  HGB 14.3 15.0 13.4  HCT 40.6 44.0 39.5  PLT 257  --  294   BMET:  Recent Labs  05/17/14 1340 05/18/14 0054  NA 143 141  K 3.7 3.4*  CL 104 103  CO2  --  27  GLUCOSE 95 98  BUN 8 8  CREATININE 1.00 0.85  CALCIUM  --  8.9    CBG (last 3)  No results found for this basename: GLUCAP,  in the last 72 hours PT/INR:   Recent Labs  05/18/14 0054  LABPROT 14.7  INR 1.17    CXR:  PORTABLE CHEST - 1 VIEW  COMPARISON: Portable exam 1323 hr without priors for comparison  FINDINGS:  Mild enlargement of cardiac silhouette.  Mediastinal contours and pulmonary vascularity normal.  Lungs clear.  No pleural effusion or pneumothorax.  Osseous structures unremarkable.  IMPRESSION:  Mild enlargement of cardiac silhouette.  No acute abnormalities.  Electronically Signed  By: Ulyses Southward M.D.  On: 05/17/2014 13:30   Assessment/Plan: S/P Procedure(s) (LRB): LEFT HEART  CATHETERIZATION WITH CORONARY ANGIOGRAM (N/A)  I again reviewed treatment options with the patient with his nephew serving as the interpreter.  The patient was initially reluctant to have anything done, and I explained that without either CABG or multivessel PCI the patient would be at considerable risk for acute MI or perhaps even death.  After a thorough discussion he has made a decision to proceed with CABG in am tomorrow.  All questions answered.  I spent in excess of 30 minutes during the conduct of this hospital encounter and >50% of this time involved direct face-to-face encounter with the patient for counseling and/or coordination of their care.   [name] 05/19/2014 9:22 AM

## 2014-05-19 NOTE — Progress Notes (Addendum)
1320-1400 Cardiac Rehab Pt's nurse request that I not ambulate hum due to his severe CAD. Completed pre-op education with pt and nephew using interpretor. I gave his nephew pre-op education booklet and pt care guide. His nephew speaks and reads [place]. We discussed sternal precautions, walking and using IS post-op. Gave pt IS and had him to practice using it.Pt's nephew states that his parent are planning on having him to stay with them at discharge and they will be able to provide 24/7 care. We will follow pt post-op as ordered. Beatrix Fetters, RN 05/19/2014 2:08 PM

## 2014-05-19 NOTE — Progress Notes (Addendum)
VASCULAR LAB PRELIMINARY  PRELIMINARY  PRELIMINARY  PRELIMINARY  Pre-op Cardiac Surgery  Carotid Findings:  Bilateral:  1-39% ICA stenosis.  Vertebral artery flow is antegrade.     Thereasa Parkin, RVT 05/19/2014 12:43 PM    Upper Extremity Right Left  Brachial Pressures 160 Triphasic 159 Triphasic  Radial Waveforms Triphasic Triphasic  Ulnar Waveforms Triphasic Triphasic  Palmar Arch (Allen's Test) Normla Normal   Findings:  Doppler waveforms remained normal bilaterally with both radial and ulnar compressions    Lower  Extremity Right Left  Dorsalis Pedis    Anterior Tibial    Posterior Tibial    Ankle/Brachial Indices      Findings:  Palpable pedal pulses

## 2014-05-19 NOTE — Progress Notes (Signed)
Utilized pacific interpreters to question patient regarding his preference for interpreters. Asked if he wanted an interpreter from the interpreter services provided by the hospital or to have his nephew interpret. The interpretor spoke to the patient in vietnamese and the patient answered the interpretor. The interpretor stated that the patient would like for his nephew to interpret. Interpretor [employer], interpreted information in [place] which is a language the patient is able to speak somewhat, Lucretia Kern asked to speak the dialect Levi Aland was not able to speak Levi Al[place] and stated no one in the service can speak this dialect. Paperwork signed by patient; and witnessed by myself

## 2014-05-20 ENCOUNTER — Inpatient Hospital Stay (HOSPITAL_COMMUNITY): Payer: 59 | Admitting: Certified Registered"

## 2014-05-20 ENCOUNTER — Inpatient Hospital Stay (HOSPITAL_COMMUNITY): Payer: 59

## 2014-05-20 ENCOUNTER — Encounter (HOSPITAL_COMMUNITY): Payer: 59 | Admitting: Certified Registered"

## 2014-05-20 ENCOUNTER — Encounter (HOSPITAL_COMMUNITY): Payer: Self-pay | Admitting: Certified Registered"

## 2014-05-20 ENCOUNTER — Encounter (HOSPITAL_COMMUNITY)
Admission: EM | Disposition: A | Discharge status: Home / Self Care | Payer: 59 | Source: Home / Self Care | Attending: Thoracic Surgery (Cardiothoracic Vascular Surgery)

## 2014-05-20 DIAGNOSIS — Z951 Presence of aortocoronary bypass graft: Secondary | ICD-10-CM

## 2014-05-20 HISTORY — PX: CORONARY ARTERY BYPASS GRAFT: SHX141

## 2014-05-20 HISTORY — DX: Presence of aortocoronary bypass graft: Z95.1

## 2014-05-20 HISTORY — PX: INTRAOPERATIVE TRANSESOPHAGEAL ECHOCARDIOGRAM: SHX5062

## 2014-05-20 LAB — POCT I-STAT 4, (NA,K, GLUC, HGB,HCT)
GLUCOSE: 97 mg/dL (ref 70–99)
Glucose, Bld: 99 mg/dL (ref 70–99)
Glucose, Bld: 91 mg/dL (ref 70–99)
Glucose, Bld: 104 mg/dL — ABNORMAL HIGH (ref 70–99)
Glucose, Bld: 117 mg/dL — ABNORMAL HIGH (ref 70–99)
Glucose, Bld: 106 mg/dL — ABNORMAL HIGH (ref 70–99)
HCT: 32 % — ABNORMAL LOW (ref 39.0–52.0)
HCT: 27 % — ABNORMAL LOW (ref 39.0–52.0)
HCT: 26 % — ABNORMAL LOW (ref 39.0–52.0)
HCT: 30 % — ABNORMAL LOW (ref 39.0–52.0)
HEMATOCRIT: 34 % — AB (ref 39.0–52.0)
HEMATOCRIT: 25 % — AB (ref 39.0–52.0)
HEMOGLOBIN: 8.5 g/dL — AB (ref 13.0–17.0)
HEMOGLOBIN: 8.8 g/dL — AB (ref 13.0–17.0)
HEMOGLOBIN: 10.2 g/dL — AB (ref 13.0–17.0)
Hemoglobin: 11.6 g/dL — ABNORMAL LOW (ref 13.0–17.0)
Hemoglobin: 10.9 g/dL — ABNORMAL LOW (ref 13.0–17.0)
Hemoglobin: 9.2 g/dL — ABNORMAL LOW (ref 13.0–17.0)
POTASSIUM: 3.5 meq/L — AB (ref 3.7–5.3)
POTASSIUM: 4.6 meq/L (ref 3.7–5.3)
POTASSIUM: 3.7 meq/L (ref 3.7–5.3)
Potassium: 3.5 mEq/L — ABNORMAL LOW (ref 3.7–5.3)
Potassium: 5.1 mEq/L (ref 3.7–5.3)
Potassium: 4.7 mEq/L (ref 3.7–5.3)
SODIUM: 138 meq/L (ref 137–147)
SODIUM: 142 meq/L (ref 137–147)
Sodium: 142 mEq/L (ref 137–147)
Sodium: 142 mEq/L (ref 137–147)
Sodium: 136 mEq/L — ABNORMAL LOW (ref 137–147)
Sodium: 136 mEq/L — ABNORMAL LOW (ref 137–147)

## 2014-05-20 LAB — PULMONARY FUNCTION TEST
FEF 25-75 Pre: 0.84 L/sec
FEF2575-%Pred-Pre: 30 %
FEV1-%Pred-Pre: 42 %
FEV1-Pre: 1.37 L
FEV1FVC-%Pred-Pre: 75 %
FEV6-%PRED-PRE: 57 %
FEV6-Pre: 2.34 L
FEV6FVC-%Pred-Pre: 102 %
FVC-%Pred-Pre: 55 %
FVC-Pre: 2.4 L
PRE FEV1/FVC RATIO: 57 %
PRE FEV6/FVC RATIO: 98 %

## 2014-05-20 LAB — APTT
APTT: 35 s (ref 24–37)
aPTT: 32 seconds (ref 24–37)

## 2014-05-20 LAB — POCT I-STAT 3, ART BLOOD GAS (G3+)
ACID-BASE DEFICIT: 1 mmol/L (ref 0.0–2.0)
ACID-BASE DEFICIT: 4 mmol/L — AB (ref 0.0–2.0)
ACID-BASE DEFICIT: 2 mmol/L (ref 0.0–2.0)
Acid-base deficit: 5 mmol/L — ABNORMAL HIGH (ref 0.0–2.0)
BICARBONATE: 24 meq/L (ref 20.0–24.0)
BICARBONATE: 21.1 meq/L (ref 20.0–24.0)
Bicarbonate: 20.9 mEq/L (ref 20.0–24.0)
Bicarbonate: 23.5 mEq/L (ref 20.0–24.0)
O2 SAT: 95 %
O2 SAT: 96 %
O2 SAT: 97 %
O2 Saturation: 100 %
PH ART: 7.316 — AB (ref 7.350–7.450)
PO2 ART: 70 mmHg — AB (ref 80.0–100.0)
PO2 ART: 88 mmHg (ref 80.0–100.0)
PO2 ART: 99 mmHg (ref 80.0–100.0)
Patient temperature: 96
Patient temperature: 37.4
Patient temperature: 37.5
TCO2: 25 mmol/L (ref 0–100)
TCO2: 22 mmol/L (ref 0–100)
TCO2: 22 mmol/L (ref 0–100)
TCO2: 25 mmol/L (ref 0–100)
pCO2 arterial: 40.3 mmHg (ref 35.0–45.0)
pCO2 arterial: 34.1 mmHg — ABNORMAL LOW (ref 35.0–45.0)
pCO2 arterial: 41.6 mmHg (ref 35.0–45.0)
pCO2 arterial: 42 mmHg (ref 35.0–45.0)
pH, Arterial: 7.383 (ref 7.350–7.450)
pH, Arterial: 7.389 (ref 7.350–7.450)
pH, Arterial: 7.359 (ref 7.350–7.450)
pO2, Arterial: 343 mmHg — ABNORMAL HIGH (ref 80.0–100.0)

## 2014-05-20 LAB — GLUCOSE, CAPILLARY
GLUCOSE-CAPILLARY: 118 mg/dL — AB (ref 70–99)
GLUCOSE-CAPILLARY: 114 mg/dL — AB (ref 70–99)
Glucose-Capillary: 112 mg/dL — ABNORMAL HIGH (ref 70–99)
Glucose-Capillary: 117 mg/dL — ABNORMAL HIGH (ref 70–99)
Glucose-Capillary: 98 mg/dL (ref 70–99)
Glucose-Capillary: 98 mg/dL (ref 70–99)

## 2014-05-20 LAB — CBC
HEMATOCRIT: 37.3 % — AB (ref 39.0–52.0)
HEMATOCRIT: 27.8 % — AB (ref 39.0–52.0)
HEMATOCRIT: 28.9 % — AB (ref 39.0–52.0)
HEMOGLOBIN: 9.9 g/dL — AB (ref 13.0–17.0)
HEMOGLOBIN: 10.1 g/dL — AB (ref 13.0–17.0)
Hemoglobin: 12.5 g/dL — ABNORMAL LOW (ref 13.0–17.0)
MCH: 27.2 pg (ref 26.0–34.0)
MCH: 28.3 pg (ref 26.0–34.0)
MCH: 27.9 pg (ref 26.0–34.0)
MCHC: 33.5 g/dL (ref 30.0–36.0)
MCHC: 35.6 g/dL (ref 30.0–36.0)
MCHC: 34.9 g/dL (ref 30.0–36.0)
MCV: 81.1 fL (ref 78.0–100.0)
MCV: 79.4 fL (ref 78.0–100.0)
MCV: 79.8 fL (ref 78.0–100.0)
PLATELETS: 277 10*3/uL (ref 150–400)
Platelets: 136 10*3/uL — ABNORMAL LOW (ref 150–400)
Platelets: 175 10*3/uL (ref 150–400)
RBC: 4.6 MIL/uL (ref 4.22–5.81)
RBC: 3.5 MIL/uL — ABNORMAL LOW (ref 4.22–5.81)
RBC: 3.62 MIL/uL — ABNORMAL LOW (ref 4.22–5.81)
RDW: 13.6 % (ref 11.5–15.5)
RDW: 13.4 % (ref 11.5–15.5)
RDW: 13.8 % (ref 11.5–15.5)
WBC: 9.1 10*3/uL (ref 4.0–10.5)
WBC: 12.5 10*3/uL — AB (ref 4.0–10.5)
WBC: 18.4 10*3/uL — ABNORMAL HIGH (ref 4.0–10.5)

## 2014-05-20 LAB — CREATININE, SERUM
Creatinine, Ser: 0.78 mg/dL (ref 0.50–1.35)
GFR calc Af Amer: >90 mL/min (ref >90)
GFR calc non Af Amer: >90 mL/min (ref >90)

## 2014-05-20 LAB — BASIC METABOLIC PANEL
BUN: 12 mg/dL (ref 6–23)
CO2: 23 mEq/L (ref 19–32)
CREATININE: 0.92 mg/dL (ref 0.50–1.35)
Calcium: 8.9 mg/dL (ref 8.4–10.5)
Chloride: 105 mEq/L (ref 96–112)
GLUCOSE: 95 mg/dL (ref 70–99)
POTASSIUM: 3.6 meq/L — AB (ref 3.7–5.3)
Sodium: 141 mEq/L (ref 137–147)

## 2014-05-20 LAB — MAGNESIUM: Magnesium: 3.1 mg/dL — ABNORMAL HIGH (ref 1.5–2.5)

## 2014-05-20 LAB — POCT I-STAT GLUCOSE
Glucose, Bld: 105 mg/dL — ABNORMAL HIGH (ref 70–99)
Operator id: 156951

## 2014-05-20 LAB — POCT I-STAT, CHEM 8
BUN: 5 mg/dL — AB (ref 6–23)
CALCIUM ION: 1.17 mmol/L (ref 1.12–1.23)
CHLORIDE: 108 meq/L (ref 96–112)
CREATININE: 0.8 mg/dL (ref 0.50–1.35)
GLUCOSE: 112 mg/dL — AB (ref 70–99)
HCT: 28 % — ABNORMAL LOW (ref 39.0–52.0)
Hemoglobin: 9.5 g/dL — ABNORMAL LOW (ref 13.0–17.0)
Potassium: 4.2 mEq/L (ref 3.7–5.3)
Sodium: 143 mEq/L (ref 137–147)
TCO2: 23 mmol/L (ref 0–100)

## 2014-05-20 LAB — PROTIME-INR
INR: 1.59 — AB (ref 0.00–1.49)
Prothrombin Time: 18.5 seconds — ABNORMAL HIGH (ref 11.6–15.2)

## 2014-05-20 LAB — HEMOGLOBIN AND HEMATOCRIT, BLOOD
HCT: 26.7 % — ABNORMAL LOW (ref 39.0–52.0)
Hemoglobin: 9.2 g/dL — ABNORMAL LOW (ref 13.0–17.0)

## 2014-05-20 LAB — HEPARIN LEVEL (UNFRACTIONATED): Heparin Unfractionated: <0.1 IU/mL — ABNORMAL LOW (ref 0.30–0.70)

## 2014-05-20 LAB — PLATELET COUNT: Platelets: 179 10*3/uL (ref 150–400)

## 2014-05-20 SURGERY — CORONARY ARTERY BYPASS GRAFTING (CABG)
Anesthesia: General | Site: Chest

## 2014-05-20 MED ORDER — ACETAMINOPHEN 160 MG/5ML PO SOLN: 1000.0000 mg | Freq: Four times a day (QID) | ORAL | Status: DC

## 2014-05-20 MED ORDER — SODIUM CHLORIDE 0.9 % IV SOLN
10.0000 g | INTRAVENOUS | Status: DC | PRN
  Administered 2014-05-20: 5 g/h via INTRAVENOUS

## 2014-05-20 MED ORDER — LACTATED RINGERS IV SOLN
INTRAVENOUS | Status: DC | PRN
  Administered 2014-05-20: 07:00:00 via INTRAVENOUS

## 2014-05-20 MED ORDER — PROPOFOL 10 MG/ML IV BOLUS
INTRAVENOUS | Status: DC | PRN
  Administered 2014-05-20: 20 mg via INTRAVENOUS

## 2014-05-20 MED ORDER — MIDAZOLAM HCL 2 MG/2ML IJ SOLN: 2.0000 mg | INTRAMUSCULAR | Status: DC | PRN

## 2014-05-20 MED ORDER — ARTIFICIAL TEARS OP OINT
TOPICAL_OINTMENT | OPHTHALMIC | Status: AC
  Filled 2014-05-20: qty 3.5

## 2014-05-20 MED ORDER — DEXMEDETOMIDINE HCL IN NACL 200 MCG/50ML IV SOLN: 0.1000 ug/kg/h | INTRAVENOUS | Status: DC

## 2014-05-20 MED ORDER — PROPOFOL 10 MG/ML IV BOLUS
INTRAVENOUS | Status: AC
  Filled 2014-05-20: qty 20

## 2014-05-20 MED ORDER — SODIUM CHLORIDE 0.45 % IV SOLN
INTRAVENOUS | Status: DC
  Administered 2014-05-20: 17:00:00 via INTRAVENOUS

## 2014-05-20 MED ORDER — POTASSIUM CHLORIDE 10 MEQ/50ML IV SOLN
10.0000 meq | INTRAVENOUS | Status: AC
  Administered 2014-05-20 (×3): 10 meq via INTRAVENOUS

## 2014-05-20 MED ORDER — PHENYLEPHRINE HCL 10 MG/ML IJ SOLN
20.0000 mg | INTRAVENOUS | Status: DC | PRN
  Administered 2014-05-20: 10 ug/min via INTRAVENOUS

## 2014-05-20 MED ORDER — ACETAMINOPHEN 650 MG RE SUPP
650.0000 mg | Freq: Once | RECTAL | Status: AC
  Administered 2014-05-20: 650 mg via RECTAL

## 2014-05-20 MED ORDER — MORPHINE SULFATE 2 MG/ML IJ SOLN
2.0000 mg | INTRAMUSCULAR | Status: DC | PRN
  Administered 2014-05-20 – 2014-05-21 (×3): 4 mg via INTRAVENOUS
  Filled 2014-05-20 (×3): qty 2

## 2014-05-20 MED ORDER — MAGNESIUM SULFATE 4000MG/100ML IJ SOLN
4.0000 g | Freq: Once | INTRAMUSCULAR | Status: AC
  Administered 2014-05-20: 4 g via INTRAVENOUS

## 2014-05-20 MED ORDER — ALBUMIN HUMAN 5 % IV SOLN
INTRAVENOUS | Status: DC | PRN
  Administered 2014-05-20 (×2): via INTRAVENOUS

## 2014-05-20 MED ORDER — SODIUM BICARBONATE 8.4 % IV SOLN
50.0000 meq | Freq: Once | INTRAVENOUS | Status: AC
  Administered 2014-05-20: 50 meq via INTRAVENOUS

## 2014-05-20 MED ORDER — BISACODYL 10 MG RE SUPP
10.0000 mg | Freq: Every day | RECTAL | Status: DC
  Administered 2014-05-23: 10 mg via RECTAL
  Filled 2014-05-20: qty 1

## 2014-05-20 MED ORDER — SODIUM CHLORIDE 0.9 % IV SOLN
250.0000 mL | INTRAVENOUS | Status: AC
  Administered 2014-05-20: 250 mL via INTRAVENOUS

## 2014-05-20 MED ORDER — METOPROLOL TARTRATE 1 MG/ML IV SOLN: 2.5000 mg | INTRAVENOUS | Status: DC | PRN

## 2014-05-20 MED ORDER — LACTATED RINGERS IV SOLN
INTRAVENOUS | Status: DC | PRN
  Administered 2014-05-20 (×2): via INTRAVENOUS

## 2014-05-20 MED ORDER — SODIUM CHLORIDE 0.9 % IJ SOLN
3.0000 mL | Freq: Two times a day (BID) | INTRAMUSCULAR | Status: DC
  Administered 2014-05-21 – 2014-05-23 (×4): 3 mL via INTRAVENOUS

## 2014-05-20 MED ORDER — VECURONIUM BROMIDE 10 MG IV SOLR
INTRAVENOUS | Status: DC | PRN
  Administered 2014-05-20: 3 mg via INTRAVENOUS
  Administered 2014-05-20: 2 mg via INTRAVENOUS
  Administered 2014-05-20: 3 mg via INTRAVENOUS
  Administered 2014-05-20: 7 mg via INTRAVENOUS
  Administered 2014-05-20: 5 mg via INTRAVENOUS

## 2014-05-20 MED ORDER — MORPHINE SULFATE 2 MG/ML IJ SOLN: 1.0000 mg | INTRAMUSCULAR | Status: AC | PRN

## 2014-05-20 MED ORDER — ASPIRIN EC 325 MG PO TBEC
325.0000 mg | DELAYED_RELEASE_TABLET | Freq: Every day | ORAL | Status: DC
  Administered 2014-05-21 – 2014-05-24 (×4): 325 mg via ORAL
  Filled 2014-05-20 (×4): qty 1

## 2014-05-20 MED ORDER — ALBUMIN HUMAN 5 % IV SOLN
250.0000 mL | INTRAVENOUS | Status: DC | PRN
  Administered 2014-05-20 (×2): 250 mL via INTRAVENOUS

## 2014-05-20 MED ORDER — ACETAMINOPHEN 500 MG PO TABS
1000.0000 mg | ORAL_TABLET | Freq: Four times a day (QID) | ORAL | Status: DC
  Administered 2014-05-21 – 2014-05-24 (×12): 1000 mg via ORAL
  Filled 2014-05-20 (×16): qty 2

## 2014-05-20 MED ORDER — LACTATED RINGERS IV SOLN: INTRAVENOUS | Status: DC

## 2014-05-20 MED ORDER — BISACODYL 5 MG PO TBEC
10.0000 mg | DELAYED_RELEASE_TABLET | Freq: Every day | ORAL | Status: DC
  Administered 2014-05-21 – 2014-05-24 (×2): 10 mg via ORAL
  Filled 2014-05-20 (×3): qty 2

## 2014-05-20 MED ORDER — PROTAMINE SULFATE 10 MG/ML IV SOLN
INTRAVENOUS | Status: DC | PRN
  Administered 2014-05-20: 250 mg via INTRAVENOUS

## 2014-05-20 MED ORDER — NITROGLYCERIN IN D5W 200-5 MCG/ML-% IV SOLN: 0.0000 ug/min | INTRAVENOUS | Status: DC

## 2014-05-20 MED ORDER — OXYCODONE HCL 5 MG PO TABS
5.0000 mg | ORAL_TABLET | ORAL | Status: DC | PRN
  Administered 2014-05-20 – 2014-05-22 (×5): 10 mg via ORAL
  Administered 2014-05-23: 5 mg via ORAL
  Administered 2014-05-23: 10 mg via ORAL
  Filled 2014-05-20: qty 2
  Filled 2014-05-20: qty 1
  Filled 2014-05-20 (×5): qty 2

## 2014-05-20 MED ORDER — ACETAMINOPHEN 160 MG/5ML PO SOLN: 650.0000 mg | Freq: Once | ORAL | Status: AC

## 2014-05-20 MED ORDER — ASPIRIN 81 MG PO CHEW: 324.0000 mg | CHEWABLE_TABLET | Freq: Every day | ORAL | Status: DC

## 2014-05-20 MED ORDER — ONDANSETRON HCL 4 MG/2ML IJ SOLN: 4.0000 mg | Freq: Four times a day (QID) | INTRAMUSCULAR | Status: DC | PRN

## 2014-05-20 MED ORDER — MAGNESIUM SULFATE 40 MG/ML IJ SOLN
INTRAMUSCULAR | Status: AC
Start: 2014-05-20 — End: 2014-05-20
  Filled 2014-05-20: qty 100

## 2014-05-20 MED ORDER — VECURONIUM BROMIDE 10 MG IV SOLR
INTRAVENOUS | Status: AC
  Filled 2014-05-20: qty 10

## 2014-05-20 MED ORDER — INSULIN ASPART 100 UNIT/ML ~~LOC~~ SOLN
0.0000 [IU] | SUBCUTANEOUS | Status: DC
  Administered 2014-05-21 – 2014-05-22 (×2): 2 [IU] via SUBCUTANEOUS

## 2014-05-20 MED ORDER — LACTATED RINGERS IV SOLN: 500.0000 mL | Freq: Once | INTRAVENOUS | Status: AC | PRN

## 2014-05-20 MED ORDER — MIDAZOLAM HCL 5 MG/5ML IJ SOLN
INTRAMUSCULAR | Status: DC | PRN
  Administered 2014-05-20: 4 mg via INTRAVENOUS
  Administered 2014-05-20: 3 mg via INTRAVENOUS
  Administered 2014-05-20: 2 mg via INTRAVENOUS
  Administered 2014-05-20: 1 mg via INTRAVENOUS

## 2014-05-20 MED ORDER — DOCUSATE SODIUM 100 MG PO CAPS
200.0000 mg | ORAL_CAPSULE | Freq: Every day | ORAL | Status: DC
  Administered 2014-05-21 – 2014-05-24 (×3): 200 mg via ORAL
  Filled 2014-05-20 (×4): qty 2

## 2014-05-20 MED ORDER — PROTAMINE SULFATE 10 MG/ML IV SOLN
INTRAVENOUS | Status: AC
  Filled 2014-05-20: qty 25

## 2014-05-20 MED ORDER — SODIUM CHLORIDE 0.9 % IV SOLN
100.0000 [IU] | INTRAVENOUS | Status: DC | PRN
  Administered 2014-05-20: 1.2 [IU]/h via INTRAVENOUS

## 2014-05-20 MED ORDER — MIDAZOLAM HCL 10 MG/2ML IJ SOLN
INTRAMUSCULAR | Status: AC
  Filled 2014-05-20: qty 2

## 2014-05-20 MED ORDER — VANCOMYCIN HCL IN DEXTROSE 1-5 GM/200ML-% IV SOLN
1000.0000 mg | Freq: Once | INTRAVENOUS | Status: AC
  Administered 2014-05-20: 1000 mg via INTRAVENOUS
  Filled 2014-05-20: qty 200

## 2014-05-20 MED ORDER — HEPARIN SODIUM (PORCINE) 1000 UNIT/ML IJ SOLN
INTRAMUSCULAR | Status: DC | PRN
  Administered 2014-05-20: 24000 [IU] via INTRAVENOUS
  Administered 2014-05-20: 3000 [IU] via INTRAVENOUS

## 2014-05-20 MED ORDER — METOPROLOL TARTRATE 12.5 MG HALF TABLET
12.5000 mg | ORAL_TABLET | Freq: Two times a day (BID) | ORAL | Status: DC
  Administered 2014-05-21 (×2): 12.5 mg via ORAL
  Filled 2014-05-20 (×5): qty 1

## 2014-05-20 MED ORDER — NITROGLYCERIN IN D5W 200-5 MCG/ML-% IV SOLN
INTRAVENOUS | Status: DC | PRN
  Administered 2014-05-20: 5 ug/min via INTRAVENOUS

## 2014-05-20 MED ORDER — 0.9 % SODIUM CHLORIDE (POUR BTL) OPTIME
TOPICAL | Status: DC | PRN
  Administered 2014-05-20: 6000 mL

## 2014-05-20 MED ORDER — INSULIN REGULAR BOLUS VIA INFUSION
0.0000 [IU] | Freq: Three times a day (TID) | INTRAVENOUS | Status: DC
  Filled 2014-05-20: qty 10

## 2014-05-20 MED ORDER — SODIUM CHLORIDE 0.9 % IV SOLN: INTRAVENOUS | Status: DC

## 2014-05-20 MED ORDER — SODIUM CHLORIDE 0.9 % IJ SOLN: 3.0000 mL | INTRAMUSCULAR | Status: DC | PRN

## 2014-05-20 MED ORDER — PANTOPRAZOLE SODIUM 40 MG PO TBEC
40.0000 mg | DELAYED_RELEASE_TABLET | Freq: Every day | ORAL | Status: DC
  Administered 2014-05-22 – 2014-05-24 (×3): 40 mg via ORAL
  Filled 2014-05-20 (×4): qty 1

## 2014-05-20 MED ORDER — SUFENTANIL CITRATE 50 MCG/ML IV SOLN
INTRAVENOUS | Status: DC | PRN
  Administered 2014-05-20: 20 ug via INTRAVENOUS
  Administered 2014-05-20: 10 ug via INTRAVENOUS
  Administered 2014-05-20: 20 ug via INTRAVENOUS
  Administered 2014-05-20: 5 ug via INTRAVENOUS
  Administered 2014-05-20 (×3): 20 ug via INTRAVENOUS
  Administered 2014-05-20: 10 ug via INTRAVENOUS
  Administered 2014-05-20: 25 ug via INTRAVENOUS
  Administered 2014-05-20 (×3): 20 ug via INTRAVENOUS

## 2014-05-20 MED ORDER — METOPROLOL TARTRATE 25 MG/10 ML ORAL SUSPENSION
12.5000 mg | Freq: Two times a day (BID) | ORAL | Status: DC
  Filled 2014-05-20 (×3): qty 5

## 2014-05-20 MED ORDER — INSULIN REGULAR HUMAN 100 UNIT/ML IJ SOLN
INTRAMUSCULAR | Status: DC
  Filled 2014-05-20: qty 1

## 2014-05-20 MED ORDER — DEXTROSE 5 % IV SOLN
1.5000 g | Freq: Two times a day (BID) | INTRAVENOUS | Status: AC
  Administered 2014-05-20 – 2014-05-22 (×4): 1.5 g via INTRAVENOUS
  Filled 2014-05-20 (×5): qty 1.5

## 2014-05-20 MED ORDER — SUFENTANIL CITRATE 250 MCG/5ML IV SOLN
INTRAVENOUS | Status: AC
  Filled 2014-05-20: qty 5

## 2014-05-20 MED ORDER — PHENYLEPHRINE HCL 10 MG/ML IJ SOLN
0.0000 ug/min | INTRAVENOUS | Status: DC
  Filled 2014-05-20: qty 2

## 2014-05-20 MED ORDER — FAMOTIDINE IN NACL 20-0.9 MG/50ML-% IV SOLN
20.0000 mg | Freq: Two times a day (BID) | INTRAVENOUS | Status: AC
  Administered 2014-05-20: 20 mg via INTRAVENOUS

## 2014-05-20 MED ORDER — SODIUM CHLORIDE 0.9 % IJ SOLN
OROMUCOSAL | Status: DC | PRN
  Administered 2014-05-20 (×5): via TOPICAL

## 2014-05-20 MED ORDER — DEXMEDETOMIDINE HCL IN NACL 200 MCG/50ML IV SOLN
INTRAVENOUS | Status: DC | PRN
  Administered 2014-05-20: 0.2 ug/kg/h via INTRAVENOUS

## 2014-05-20 SURGICAL SUPPLY — 117 items
ADAPTER CARDIO PERF ANTE/RETRO (ADAPTER) IMPLANT
APPLIER CLIP 9.375 MED OPEN (MISCELLANEOUS)
APPLIER CLIP 9.375 SM OPEN (CLIP)
ATTRACTOMAT 16X20 MAGNETIC DRP (DRAPES) ×3 IMPLANT
BAG DECANTER FOR FLEXI CONT (MISCELLANEOUS) ×3 IMPLANT
BANDAGE ELASTIC 4 VELCRO ST LF (GAUZE/BANDAGES/DRESSINGS) ×3 IMPLANT
BANDAGE ELASTIC 6 VELCRO ST LF (GAUZE/BANDAGES/DRESSINGS) ×3 IMPLANT
BANDAGE GAUZE ELAST BULKY 4 IN (GAUZE/BANDAGES/DRESSINGS) ×3 IMPLANT
BASKET HEART (ORDER IN 25'S) (MISCELLANEOUS) ×1
BASKET HEART (ORDER IN 25S) (MISCELLANEOUS) ×2 IMPLANT
BENZOIN TINCTURE PRP APPL 2/3 (GAUZE/BANDAGES/DRESSINGS) ×3 IMPLANT
BLADE STERNUM SYSTEM 6 (BLADE) ×3 IMPLANT
BLADE SURG ROTATE 9660 (MISCELLANEOUS) IMPLANT
CANISTER SUCTION 2500CC (MISCELLANEOUS) ×3 IMPLANT
CANNULA EZ GLIDE AORTIC 21FR (CANNULA) ×3 IMPLANT
CANNULA GUNDRY RCSP 15FR (MISCELLANEOUS) IMPLANT
CANNULA VENOUS LOW PROF 34X46 (CANNULA) ×3 IMPLANT
CARDIAC SUCTION (MISCELLANEOUS) ×3 IMPLANT
CATH CPB KIT OWEN (MISCELLANEOUS) ×3 IMPLANT
CATH THORACIC 28FR (CATHETERS) IMPLANT
CATH THORACIC 28FR RT ANG (CATHETERS) IMPLANT
CATH THORACIC 36FR (CATHETERS) ×3 IMPLANT
CATH THORACIC 36FR RT ANG (CATHETERS) ×3 IMPLANT
CLIP APPLIE 9.375 MED OPEN (MISCELLANEOUS) IMPLANT
CLIP APPLIE 9.375 SM OPEN (CLIP) IMPLANT
CLIP FOGARTY SPRING 6M (CLIP) IMPLANT
CLIP RETRACTION 3.0MM CORONARY (MISCELLANEOUS) ×3 IMPLANT
CLIP TI MEDIUM 24 (CLIP) IMPLANT
CLIP TI WIDE RED SMALL 24 (CLIP) IMPLANT
CONN Y 3/8X3/8X3/8  BEN (MISCELLANEOUS)
CONN Y 3/8X3/8X3/8 BEN (MISCELLANEOUS) IMPLANT
COVER SURGICAL LIGHT HANDLE (MISCELLANEOUS) ×3 IMPLANT
CRADLE DONUT ADULT HEAD (MISCELLANEOUS) ×3 IMPLANT
DRAIN CHANNEL 32F RND 10.7 FF (WOUND CARE) ×3 IMPLANT
DRAPE CARDIOVASCULAR INCISE (DRAPES) ×1
DRAPE SLUSH/WARMER DISC (DRAPES) ×3 IMPLANT
DRAPE SRG 135X102X78XABS (DRAPES) ×2 IMPLANT
DRSG COVADERM 4X14 (GAUZE/BANDAGES/DRESSINGS) ×3 IMPLANT
ELECT REM PT RETURN 9FT ADLT (ELECTROSURGICAL) ×6
ELECTRODE REM PT RTRN 9FT ADLT (ELECTROSURGICAL) ×4 IMPLANT
GLOVE BIO SURGEON STRL SZ 6 (GLOVE) ×30 IMPLANT
GLOVE BIO SURGEON STRL SZ 6.5 (GLOVE) IMPLANT
GLOVE BIO SURGEON STRL SZ7 (GLOVE) IMPLANT
GLOVE BIO SURGEON STRL SZ7.5 (GLOVE) IMPLANT
GLOVE BIOGEL PI IND STRL 6 (GLOVE) IMPLANT
GLOVE BIOGEL PI IND STRL 6.5 (GLOVE) ×4 IMPLANT
GLOVE BIOGEL PI IND STRL 7.0 (GLOVE) ×8 IMPLANT
GLOVE BIOGEL PI INDICATOR 6 (GLOVE)
GLOVE BIOGEL PI INDICATOR 6.5 (GLOVE) ×2
GLOVE BIOGEL PI INDICATOR 7.0 (GLOVE) ×4
GLOVE EUDERMIC 7 POWDERFREE (GLOVE) IMPLANT
GLOVE ORTHO TXT STRL SZ7.5 (GLOVE) ×6 IMPLANT
GOWN STRL REUS W/ TWL LRG LVL3 (GOWN DISPOSABLE) ×24 IMPLANT
GOWN STRL REUS W/TWL LRG LVL3 (GOWN DISPOSABLE) ×12
HEMOSTAT POWDER SURGIFOAM 1G (HEMOSTASIS) ×9 IMPLANT
INSERT FOGARTY 61MM (MISCELLANEOUS) IMPLANT
INSERT FOGARTY XLG (MISCELLANEOUS) ×3 IMPLANT
KIT BASIN OR (CUSTOM PROCEDURE TRAY) ×3 IMPLANT
KIT ROOM TURNOVER OR (KITS) ×3 IMPLANT
KIT SUCTION CATH 14FR (SUCTIONS) ×15 IMPLANT
KIT VASOVIEW W/TROCAR VH 2000 (KITS) ×3 IMPLANT
LEAD PACING MYOCARDI (MISCELLANEOUS) ×3 IMPLANT
MARKER GRAFT CORONARY BYPASS (MISCELLANEOUS) ×9 IMPLANT
NS IRRIG 1000ML POUR BTL (IV SOLUTION) ×15 IMPLANT
PACK OPEN HEART (CUSTOM PROCEDURE TRAY) ×3 IMPLANT
PAD ARMBOARD 7.5X6 YLW CONV (MISCELLANEOUS) ×3 IMPLANT
PAD ELECT DEFIB RADIOL ZOLL (MISCELLANEOUS) ×3 IMPLANT
PENCIL BUTTON HOLSTER BLD 10FT (ELECTRODE) ×3 IMPLANT
PUNCH AORTIC ROT 4.0MM RCL 40 (MISCELLANEOUS) ×3 IMPLANT
PUNCH AORTIC ROTATE 4.0MM (MISCELLANEOUS) IMPLANT
PUNCH AORTIC ROTATE 4.5MM 8IN (MISCELLANEOUS) IMPLANT
PUNCH AORTIC ROTATE 5MM 8IN (MISCELLANEOUS) IMPLANT
SET CARDIOPLEGIA MPS 5001102 (MISCELLANEOUS) ×3 IMPLANT
SOLUTION ANTI FOG 6CC (MISCELLANEOUS) IMPLANT
SPONGE GAUZE 4X4 12PLY (GAUZE/BANDAGES/DRESSINGS) ×6 IMPLANT
SPONGE LAP 18X18 X RAY DECT (DISPOSABLE) IMPLANT
SPONGE LAP 4X18 X RAY DECT (DISPOSABLE) ×3 IMPLANT
SUT BONE WAX W31G (SUTURE) ×3 IMPLANT
SUT ETHIBOND X763 2 0 SH 1 (SUTURE) ×6 IMPLANT
SUT MNCRL AB 3-0 PS2 18 (SUTURE) ×6 IMPLANT
SUT MNCRL AB 4-0 PS2 18 (SUTURE) IMPLANT
SUT PDS AB 1 CTX 36 (SUTURE) ×6 IMPLANT
SUT PROLENE 2 0 SH DA (SUTURE) IMPLANT
SUT PROLENE 3 0 SH DA (SUTURE) ×3 IMPLANT
SUT PROLENE 3 0 SH1 36 (SUTURE) IMPLANT
SUT PROLENE 4 0 P 3 18 (SUTURE) ×3 IMPLANT
SUT PROLENE 4 0 RB 1 (SUTURE) ×1
SUT PROLENE 4 0 SH DA (SUTURE) IMPLANT
SUT PROLENE 4-0 RB1 .5 CRCL 36 (SUTURE) ×2 IMPLANT
SUT PROLENE 5 0 C 1 36 (SUTURE) IMPLANT
SUT PROLENE 6 0 C 1 30 (SUTURE) IMPLANT
SUT PROLENE 7.0 RB 3 (SUTURE) ×15 IMPLANT
SUT PROLENE 8 0 BV175 6 (SUTURE) IMPLANT
SUT PROLENE BLUE 7 0 (SUTURE) ×3 IMPLANT
SUT PROLENE POLY MONO (SUTURE) IMPLANT
SUT SILK  1 MH (SUTURE) ×1
SUT SILK 1 MH (SUTURE) ×2 IMPLANT
SUT STEEL 6MS V (SUTURE) IMPLANT
SUT STEEL STERNAL CCS#1 18IN (SUTURE) IMPLANT
SUT STEEL SZ 6 DBL 3X14 BALL (SUTURE) IMPLANT
SUT VIC AB 1 CTX 36 (SUTURE)
SUT VIC AB 1 CTX36XBRD ANBCTR (SUTURE) IMPLANT
SUT VIC AB 2-0 CT1 27 (SUTURE)
SUT VIC AB 2-0 CT1 TAPERPNT 27 (SUTURE) IMPLANT
SUT VIC AB 2-0 CTX 27 (SUTURE) IMPLANT
SUT VIC AB 3-0 SH 27 (SUTURE)
SUT VIC AB 3-0 SH 27X BRD (SUTURE) IMPLANT
SUT VIC AB 3-0 X1 27 (SUTURE) IMPLANT
SUT VICRYL 4-0 PS2 18IN ABS (SUTURE) IMPLANT
SUTURE E-PAK OPEN HEART (SUTURE) ×3 IMPLANT
SYSTEM SAHARA CHEST DRAIN ATS (WOUND CARE) ×3 IMPLANT
TOWEL OR 17X24 6PK STRL BLUE (TOWEL DISPOSABLE) ×6 IMPLANT
TOWEL OR 17X26 10 PK STRL BLUE (TOWEL DISPOSABLE) ×6 IMPLANT
TRAY FOLEY IC TEMP SENS 16FR (CATHETERS) ×3 IMPLANT
TUBING INSUFFLATION 10FT LAP (TUBING) ×3 IMPLANT
UNDERPAD 30X30 INCONTINENT (UNDERPADS AND DIAPERS) ×3 IMPLANT
WATER STERILE IRR 1000ML POUR (IV SOLUTION) ×6 IMPLANT

## 2014-05-20 NOTE — Anesthesia Procedure Notes (Signed)
Procedures

## 2014-05-20 NOTE — Procedures (Signed)
Extubation Procedure Note  Patient Details:   Name: Roger Howe DOB: 1956-01-28 MRN: [id number]   Airway Documentation:     Evaluation  O2 sats: stable throughout Complications: No apparent complications Patient did tolerate procedure well. Bilateral Breath Sounds: Rhonchi   Yes Language barrier, NIF-20, FVC Placed on 4l min    Roger Howe [place] 05/20/2014, 3:59 PM

## 2014-05-20 NOTE — Op Note (Signed)
CARDIOTHORACIC SURGERY OPERATIVE NOTE  Date of Procedure: 05/20/2014  Preoperative Diagnosis: Severe 3-vessel Coronary Artery Disease  Postoperative Diagnosis: Same  Procedure:    Coronary Artery Bypass Grafting x 4   Left Internal Mammary Artery to Mid Left Anterior Descending Coronary Artery  Saphenous Vein Graft to Posterior Descending Coronary Artery with  Sequential Saphenous Vein Graft to Second Right Posterolateral Branch Coronary Artery  Sapheonous Vein Graft to Diagonal Branch Coronary Artery  Endoscopic Vein Harvest from Right Thigh and Lower Leg  Surgeon: Salvatore Decentlarence H. Cornelius Moraswen, MD  Assistant: Lowella DandyErin Barrett, PA-C  Anesthesia: Germaine PomfretE. Carswell Jackson, MD  Operative Findings: Normal LV systolic function  Small caliber but otherwise good quality LIMA conduit for grafting  Good quality SVG conduit for grafting  Intramyocardial LAD  Good quality target vessels for grafting    BRIEF CLINICAL NOTE AND INDICATIONS FOR SURGERY  Patient is a 58 year old [place] male with a reported history of hypertension and a family history of CAD, but no other risk factors for coronary artery disease. The patient was in his usual state of health until last week when he first began to experience intermittent pain in his chest and left arm. The patient describes the pain as a burning sensation. It was not associated with shortness of breath, nausea, diaphoresis, or lightheadedness. Symptoms waxed and waned for several days until the patient presented to the hospital yesterday. Initial electrocardiograms were notable for the absence of acute ischemic changes, but cardiac enzymes have been positive. CT angiogram of the chest was notable for the absence of pulmonary embolus but confirmed the presence of significant coronary artery calcification as well as significant atherosclerotic disease of the abdominal aorta. The patient underwent diagnostic cardiac catheterization earlier today by Dr. Herbie BaltimoreHarding. He was  found to have severe three-vessel coronary artery disease with preserved left ventricular function. Cardiothoracic surgical consultation was requested.  The patient has been seen in consultation and counseled at length regarding the indications, risks and potential benefits of surgery.  All questions have been answered, and the patient provides full informed consent for the operation as described.     DETAILS OF THE OPERATIVE PROCEDURE  Preparation:  The patient is brought to the operating room on the above mentioned date and central monitoring was established by the anesthesia team including placement of Swan-Ganz catheter and radial arterial line. The patient is placed in the supine position on the operating table.  Intravenous antibiotics are administered. General endotracheal anesthesia is induced uneventfully. A Foley catheter is placed.  Baseline transesophageal echocardiogram was performed.  Findings were notable for mild global LV systolic dysfunction without any wall motion abnormalities.  The patient's chest, abdomen, both groins, and both lower extremities are prepared and draped in a sterile manner. A time out procedure is performed.   Surgical Approach and Conduit Harvest:  A median sternotomy incision was performed and the left internal mammary artery is dissected from the chest wall and prepared for bypass grafting. The left internal mammary artery is notably good quality conduit. Simultaneously, saphenous vein is obtained from the patient's right thigh and leg using endoscopic vein harvest technique. The saphenous vein is notably good quality conduit. After removal of the saphenous vein, the small surgical incisions in the lower extremity are closed with absorbable suture. Following systemic heparinization, the left internal mammary artery was transected distally noted to have excellent flow.   Extracorporeal Cardiopulmonary Bypass and Myocardial Protection:  The pericardium is  opened. The ascending aorta is normal in appearance. The ascending aorta  and the right atrium are cannulated for cardioplegia bypass.  Adequate heparinization is verified.    The entire pre-bypass portion of the operation was notable for stable hemodynamics.  Cardiopulmonary bypass was begun and the surface of the heart is inspected. Distal target vessels are selected for coronary artery bypass grafting. A cardioplegia cannula is placed in the ascending aorta.  A temperature probe was placed in the interventricular septum.  The patient is allowed to cool passively to [facility] systemic temperature.  The aortic cross clamp is applied and cold blood cardioplegia is delivered initially in an antegrade fashion through the aortic root.   Iced saline slush is applied for topical hypothermia.  The initial cardioplegic arrest is rapid with early diastolic arrest.  Repeat doses of cardioplegia are administered intermittently throughout the entire cross clamp portion of the operation through the aortic root and through subsequently placed vein grafts in order to maintain completely flat electrocardiogram and septal myocardial temperature below 15C.  Myocardial protection was felt to be excellent.  Coronary Artery Bypass Grafting:   The posterior descending branch of the right coronary artery was grafted using a reversed saphenous vein graft in an side-to-side fashion.  At the site of distal anastomosis the target vessel was good quality and measured approximately 1.5 mm in diameter.  The second posterolateral branch of the right coronary artery was grafted in and end-to-side fashion using a sequential saphenous vein graft off of the distal segment of the vein graft placed to the posterior descending coronary artery.  At the site of distal anastomosis the target vessel was good quality and measured approximately 1.3 mm in diameter.  The diagonal branch of the left anterior descending coronary artery was grafted  using a reversed saphenous vein graft in an end-to-side fashion.  At the site of distal anastomosis the target vessel was good quality and measured approximately 1.5 mm in diameter.  The mid left anterior coronary artery was grafted with the left internal mammary artery in an end-to-side fashion.  At the site of distal anastomosis the target vessel was intramyocardial but good quality and measured approximately 1.5 mm in diameter.  All proximal vein graft anastomoses were placed directly to the ascending aorta prior to removal of the aortic cross clamp.  The septal myocardial temperature rose rapidly after reperfusion of the left internal mammary artery graft.  The aortic cross clamp was removed after a total cross clamp time of 68 minutes.   Procedure Completion:  All proximal and distal coronary anastomoses were inspected for hemostasis and appropriate graft orientation. Epicardial pacing wires are fixed to the right ventricular outflow tract and to the right atrial appendage. The patient is rewarmed to 37C temperature. The patient is weaned and disconnected from cardiopulmonary bypass.  The patient's rhythm at separation from bypass was sinus.  The patient was weaned from cardioplegic bypass without any inotropic support. Total cardiopulmonary bypass time for the operation was 83 minutes.  Followup transesophageal echocardiogram performed after separation from bypass revealed improved LV function and otherwise no changes from the preoperative exam.  The aortic and venous cannula were removed uneventfully. Protamine was administered to reverse the anticoagulation. The mediastinum and pleural space were inspected for hemostasis and irrigated with saline solution. The mediastinum and the left pleural space were drained using 3 chest tubes placed through separate stab incisions inferiorly.  The soft tissues anterior to the aorta were reapproximated loosely. The sternum is closed with double strength  sternal wire. The soft tissues anterior to the sternum  were closed in multiple layers and the skin is closed with a running subcuticular skin closure.  The post-bypass portion of the operation was notable for stable rhythm and hemodynamics.  No blood products were administered during the operation.   Disposition:  The patient tolerated the procedure well and is transported to the surgical intensive care in stable condition. There are no intraoperative complications. All sponge instrument and needle counts are verified correct at completion of the operation.    Salvatore Decent. [name] MD 05/20/2014 12:31 PM

## 2014-05-20 NOTE — Progress Notes (Signed)
RT note- charting error, patient has been on VT of 630ml's since icu admission.

## 2014-05-20 NOTE — Brief Op Note (Addendum)
05/17/2014 - 05/20/2014  11:01 AM  PATIENT:  Roger Howe  58 y.o. male  PRE-OPERATIVE DIAGNOSIS:  CAD  POST-OPERATIVE DIAGNOSIS:  CAD  PROCEDURE:  Procedure(s):  CORONARY ARTERY BYPASS GRAFTING x 4 -LIMA to MID LAD -SVG to DIAGONAL -SEQUENTIAL SVG to PDA, PLB #2  SURGEON:    [name], MD  ASSISTANTS:  Lowella Dandy, PA-C  ANESTHESIA:   Germaine Pomfret, MD  CROSSCLAMP TIME:   45'  CARDIOPULMONARY BYPASS TIME: 38'  FINDINGS:  Normal LV systolic function  Small caliber but otherwise good quality LIMA conduit for grafting  Good quality SVG conduit for grafting  Intramyocardial LAD  Good quality target vessels for grafting  COMPLICATIONS: None  BASELINE WEIGHT: 80 kg  PATIENT DISPOSITION:   TO SICU IN STABLE CONDITION  [name] 05/20/2014 12:23 PM

## 2014-05-20 NOTE — Anesthesia Preprocedure Evaluation (Addendum)
Anesthesia Evaluation  Patient identified by MRN, date of birth, ID band Patient awake    Reviewed: Allergy & Precautions, H&P , NPO status , Patient's Chart, lab work & pertinent test results, reviewed documented beta blocker date and time   History of Anesthesia Complications Negative for: history of anesthetic complications  Airway Mallampati: II TM Distance: >3 FB Neck ROM: Full    Dental  (+) Missing, Dental Advisory Given, Loose, Chipped   Pulmonary neg pulmonary ROS,  breath sounds clear to auscultation  Pulmonary exam normal       Cardiovascular hypertension, Pt. on medications + angina + CAD (severe 3v ASCADz, preserved LVF), + Past MI and + Peripheral Vascular Disease Rhythm:Regular Rate:Normal     Neuro/Psych negative neurological ROS     GI/Hepatic negative GI ROS, Neg liver ROS,   Endo/Other  negative endocrine ROS  Renal/GU negative Renal ROS     Musculoskeletal   Abdominal   Peds  Hematology negative hematology ROS (+)   Anesthesia Other Findings   Reproductive/Obstetrics                       Anesthesia Physical Anesthesia Plan  ASA: III  Anesthesia Plan: General   Post-op Pain Management:    Induction: Intravenous  Airway Management Planned: Oral ETT  Additional Equipment: Arterial line, PA Cath, 3D TEE and TEE  Intra-op Plan:   Post-operative Plan: Post-operative intubation/ventilation  Informed Consent: I have reviewed the patients History and Physical, chart, labs and discussed the procedure including the risks, benefits and alternatives for the proposed anesthesia with the patient or authorized representative who has indicated his/her understanding and acceptance.   Dental advisory given  Plan Discussed with: CRNA, Anesthesiologist and Surgeon  Anesthesia Plan Comments: (Plan routine monitors, A line, PA cath, GETA with post op ventilation)       Anesthesia Quick Evaluation

## 2014-05-20 NOTE — Progress Notes (Signed)
Patient ID: Roger Howe, male   DOB: September 20, 1956, 58 y.o.   MRN: [id number]   SICU Evening Rounds:   Hemodynamically stable  CI = 1.8  Extubated  Urine output good  CT output low  CBC    Component Value Date/Time   WBC 12.5* 05/20/2014 1300   RBC 3.50* 05/20/2014 1300   HGB 10.2* 05/20/2014 1305   HCT 30.0* 05/20/2014 1305   PLT 136* 05/20/2014 1300   MCV 79.4 05/20/2014 1300   MCH 28.3 05/20/2014 1300   MCHC 35.6 05/20/2014 1300   RDW 13.4 05/20/2014 1300   LYMPHSABS 2.0 05/17/2014 1322   MONOABS 0.5 05/17/2014 1322   EOSABS 0.1 05/17/2014 1322   BASOSABS 0.0 05/17/2014 1322     BMET    Component Value Date/Time   NA 142 05/20/2014 1305   K 3.7 05/20/2014 1305   CL 105 05/20/2014 0305   CO2 23 05/20/2014 0305   GLUCOSE 106* 05/20/2014 1305   BUN 12 05/20/2014 0305   CREATININE 0.92 05/20/2014 0305   CALCIUM 8.9 05/20/2014 0305   GFRNONAA >90 05/20/2014 0305   GFRAA >90 05/20/2014 0305     A/P:  Stable postop course. Continue current plans

## 2014-05-20 NOTE — Progress Notes (Signed)
Pt did not want male to clip hair for surgery.  Pre-surgical bath and clipping delayed until Male NT Antonio responded to perform the duty.  Pt then allowed NT Sonja to perform morning bath.  Pt speaks no English but chose for nephew and friend to interpret instead of pacific interpreters, consent for release of responsibility for translation noted on chart.  Pt's friend Dery and nephew [name] fluent in [place], read consents to pt in his dialect, questions answered, pt states he understands risks and benefits of surgery and wishes to proceed, consents signed by pt, friend who translated, and RN.  Pt denies pain, to OR per stretcher with friend Dery to interpret per his wishes.

## 2014-05-20 NOTE — Transfer of Care (Signed)
Immediate Anesthesia Transfer of Care Note  Patient: Roger Howe  Procedure(s) Performed: Procedure(s): CORONARY ARTERY BYPASS GRAFTING (CABG) x 4,  LIMA-MID LAD,  SVG-DIAGONAL,  SEQUENTIAL SVG -PDA, PLB #2 (N/A) INTRAOPERATIVE TRANSESOPHAGEAL ECHOCARDIOGRAM (N/A)  Patient Location: ICU  Anesthesia Type:General  Level of Consciousness: unresponsive and Patient remains intubated per anesthesia plan  Airway & Oxygen Therapy: Patient remains intubated per anesthesia plan and Patient placed on Ventilator (see vital sign flow sheet for setting)  Post-op Assessment: Report given to PACU RN  Post vital signs: Reviewed and stable  Complications: No apparent anesthesia complications

## 2014-05-21 ENCOUNTER — Inpatient Hospital Stay (HOSPITAL_COMMUNITY): Payer: 59

## 2014-05-21 ENCOUNTER — Encounter (HOSPITAL_COMMUNITY): Payer: Self-pay | Admitting: Thoracic Surgery (Cardiothoracic Vascular Surgery)

## 2014-05-21 DIAGNOSIS — I10 Essential (primary) hypertension: Secondary | ICD-10-CM

## 2014-05-21 DIAGNOSIS — I700 Atherosclerosis of aorta: Secondary | ICD-10-CM

## 2014-05-21 DIAGNOSIS — Z951 Presence of aortocoronary bypass graft: Secondary | ICD-10-CM

## 2014-05-21 DIAGNOSIS — I2510 Atherosclerotic heart disease of native coronary artery without angina pectoris: Secondary | ICD-10-CM

## 2014-05-21 LAB — BASIC METABOLIC PANEL
BUN: 7 mg/dL (ref 6–23)
CALCIUM: 8 mg/dL — AB (ref 8.4–10.5)
CHLORIDE: 104 meq/L (ref 96–112)
CO2: 20 meq/L (ref 19–32)
CREATININE: 0.73 mg/dL (ref 0.50–1.35)
GFR calc Af Amer: >90 mL/min (ref >90)
GFR calc non Af Amer: >90 mL/min (ref >90)
GLUCOSE: 124 mg/dL — AB (ref 70–99)
Potassium: 4.1 mEq/L (ref 3.7–5.3)
Sodium: 138 mEq/L (ref 137–147)

## 2014-05-21 LAB — CREATININE, SERUM
Creatinine, Ser: 0.87 mg/dL (ref 0.50–1.35)
GFR calc non Af Amer: >90 mL/min (ref >90)

## 2014-05-21 LAB — POCT I-STAT 3, ART BLOOD GAS (G3+)
ACID-BASE DEFICIT: 2 mmol/L (ref 0.0–2.0)
BICARBONATE: 22 meq/L (ref 20.0–24.0)
O2 Saturation: 97 %
TCO2: 23 mmol/L (ref 0–100)
pCO2 arterial: 36.8 mmHg (ref 35.0–45.0)
pH, Arterial: 7.388 (ref 7.350–7.450)
pO2, Arterial: 93 mmHg (ref 80.0–100.0)

## 2014-05-21 LAB — GLUCOSE, CAPILLARY
Glucose-Capillary: 108 mg/dL — ABNORMAL HIGH (ref 70–99)
Glucose-Capillary: 110 mg/dL — ABNORMAL HIGH (ref 70–99)
Glucose-Capillary: 111 mg/dL — ABNORMAL HIGH (ref 70–99)
Glucose-Capillary: 112 mg/dL — ABNORMAL HIGH (ref 70–99)
Glucose-Capillary: 121 mg/dL — ABNORMAL HIGH (ref 70–99)
Glucose-Capillary: 135 mg/dL — ABNORMAL HIGH (ref 70–99)

## 2014-05-21 LAB — CBC
HEMATOCRIT: 28.6 % — AB (ref 39.0–52.0)
HEMATOCRIT: 33.2 % — AB (ref 39.0–52.0)
HEMOGLOBIN: 10 g/dL — AB (ref 13.0–17.0)
HEMOGLOBIN: 11.3 g/dL — AB (ref 13.0–17.0)
MCH: 28.2 pg (ref 26.0–34.0)
MCH: 27.8 pg (ref 26.0–34.0)
MCHC: 35 g/dL (ref 30.0–36.0)
MCHC: 34 g/dL (ref 30.0–36.0)
MCV: 80.6 fL (ref 78.0–100.0)
MCV: 81.6 fL (ref 78.0–100.0)
PLATELETS: 153 10*3/uL (ref 150–400)
Platelets: 219 10*3/uL (ref 150–400)
RBC: 3.55 MIL/uL — AB (ref 4.22–5.81)
RBC: 4.07 MIL/uL — AB (ref 4.22–5.81)
RDW: 13.8 % (ref 11.5–15.5)
RDW: 14.3 % (ref 11.5–15.5)
WBC: 13.5 10*3/uL — AB (ref 4.0–10.5)
WBC: 22.2 10*3/uL — AB (ref 4.0–10.5)

## 2014-05-21 LAB — MAGNESIUM
Magnesium: 2.5 mg/dL (ref 1.5–2.5)
Magnesium: 2.2 mg/dL (ref 1.5–2.5)

## 2014-05-21 MED ORDER — FUROSEMIDE 10 MG/ML IJ SOLN
20.0000 mg | Freq: Four times a day (QID) | INTRAMUSCULAR | Status: AC
  Administered 2014-05-21 (×3): 20 mg via INTRAVENOUS
  Filled 2014-05-21 (×2): qty 2

## 2014-05-21 MED ORDER — ATORVASTATIN CALCIUM 20 MG PO TABS
20.0000 mg | ORAL_TABLET | Freq: Every day | ORAL | Status: DC
  Administered 2014-05-22 – 2014-05-23 (×2): 20 mg via ORAL
  Filled 2014-05-21 (×3): qty 1

## 2014-05-21 MED ORDER — TRAMADOL HCL 50 MG PO TABS: 50.0000 mg | ORAL_TABLET | ORAL | Status: DC | PRN

## 2014-05-21 MED ORDER — LISINOPRIL 5 MG PO TABS
5.0000 mg | ORAL_TABLET | Freq: Every day | ORAL | Status: DC
  Administered 2014-05-22 – 2014-05-23 (×2): 5 mg via ORAL
  Filled 2014-05-21 (×3): qty 1

## 2014-05-21 MED ORDER — SODIUM CHLORIDE 0.9 % IJ SOLN
3.0000 mL | Freq: Two times a day (BID) | INTRAMUSCULAR | Status: DC
  Administered 2014-05-21 – 2014-05-23 (×5): 3 mL via INTRAVENOUS

## 2014-05-21 MED ORDER — SODIUM CHLORIDE 0.9 % IJ SOLN: 3.0000 mL | INTRAMUSCULAR | Status: DC | PRN

## 2014-05-21 MED ORDER — SODIUM CHLORIDE 0.9 % IV SOLN: 250.0000 mL | INTRAVENOUS | Status: DC | PRN

## 2014-05-21 MED ORDER — INSULIN ASPART 100 UNIT/ML ~~LOC~~ SOLN
0.0000 [IU] | SUBCUTANEOUS | Status: DC
  Administered 2014-05-21 – 2014-05-22 (×2): 2 [IU] via SUBCUTANEOUS

## 2014-05-21 MED ORDER — MOVING RIGHT ALONG BOOK
Freq: Once | Status: AC
  Administered 2014-05-21: 08:00:00
  Filled 2014-05-21: qty 1

## 2014-05-21 MED ORDER — MORPHINE SULFATE 2 MG/ML IJ SOLN
2.0000 mg | INTRAMUSCULAR | Status: DC | PRN
  Administered 2014-05-21 (×3): 2 mg via INTRAVENOUS
  Filled 2014-05-21 (×3): qty 1

## 2014-05-21 MED ORDER — POTASSIUM CHLORIDE CRYS ER 20 MEQ PO TBCR
20.0000 meq | EXTENDED_RELEASE_TABLET | Freq: Two times a day (BID) | ORAL | Status: AC
  Administered 2014-05-22 – 2014-05-23 (×4): 20 meq via ORAL
  Filled 2014-05-21 (×5): qty 1

## 2014-05-21 MED ORDER — POTASSIUM CHLORIDE 10 MEQ/50ML IV SOLN
10.0000 meq | INTRAVENOUS | Status: AC
  Administered 2014-05-21 (×2): 10 meq via INTRAVENOUS

## 2014-05-21 MED ORDER — ATORVASTATIN CALCIUM 10 MG PO TABS: 10.0000 mg | ORAL_TABLET | Freq: Every day | ORAL | Status: DC

## 2014-05-21 MED ORDER — FUROSEMIDE 40 MG PO TABS
40.0000 mg | ORAL_TABLET | Freq: Two times a day (BID) | ORAL | Status: AC
  Administered 2014-05-22 – 2014-05-23 (×4): 40 mg via ORAL
  Filled 2014-05-21 (×5): qty 1

## 2014-05-21 MED FILL — Dexmedetomidine HCl IV Soln 200 MCG/2ML: INTRAVENOUS | Qty: 2 | Status: AC

## 2014-05-21 MED FILL — Magnesium Sulfate Inj 50%: INTRAMUSCULAR | Qty: 10 | Status: AC

## 2014-05-21 MED FILL — Potassium Chloride Inj 2 mEq/ML: INTRAVENOUS | Qty: 40 | Status: AC

## 2014-05-21 MED FILL — Heparin Sodium (Porcine) Inj 1000 Unit/ML: INTRAMUSCULAR | Qty: 30 | Status: AC

## 2014-05-21 NOTE — Progress Notes (Signed)
      [address]       [place] [address]             [phone]        CARDIOTHORACIC SURGERY PROGRESS NOTE   R1 Day Post-Op Procedure(s) (LRB): CORONARY ARTERY BYPASS GRAFTING (CABG) x 4,  LIMA-MID LAD,  SVG-DIAGONAL,  SEQUENTIAL SVG -PDA, PLB #2 (N/A) INTRAOPERATIVE TRANSESOPHAGEAL ECHOCARDIOGRAM (N/A)  Subjective: Looks good.  Breathing comfortably.  Objective: Vital signs: BP Readings from Last 1 Encounters:  05/21/14 112/71   Pulse Readings from Last 1 Encounters:  05/21/14 88   Resp Readings from Last 1 Encounters:  05/21/14 26   Temp Readings from Last 1 Encounters:  05/21/14 100 F (37.8 C)     Hemodynamics: PAP: (25-39)/(11-23) 30/16 mmHg CO:  [3.5 L/min-4.4 L/min] 3.8 L/min CI:  [1.8 L/min/m2-2.4 L/min/m2] 2 L/min/m2  Physical Exam:  Rhythm:   sinus  Breath sounds: clear  Heart sounds:  RRR  Incisions:  Dressing dry, intact  Abdomen:  Soft, non-distended, non-tender  Extremities:  Warm, well-perfused    Intake/Output from previous day: 05/21 0701 - 05/22 0700 In: 6250.7 [P.O.:240; I.V.:4050.7; Blood:330; NG/GT:30; IV Piggyback:1600] Out: 6520 [Urine:4500; Blood:1570; Chest Tube:450] Intake/Output this shift:    Lab Results:  CBC: Recent Labs  05/20/14 1840 05/20/14 1841 05/21/14 0415  WBC 18.4*  --  13.5*  HGB 10.1* 9.5* 10.0*  HCT 28.9* 28.0* 28.6*  PLT 175  --  153    BMET:  Recent Labs  05/20/14 0305  05/20/14 1841 05/21/14 0415  NA 141  < > 143 138  K 3.6*  < > 4.2 4.1  CL 105  --  108 104  CO2 23  --   --  20  GLUCOSE 95  < > 112* 124*  BUN 12  --  5* 7  CREATININE 0.92  < > 0.80 0.73  CALCIUM 8.9  --   --  8.0*  < > = values in this interval not displayed.   CBG (last 3)   Recent Labs  05/20/14 1923 05/20/14 2346 05/21/14 0341  GLUCAP 98 108* 110*    ABG    Component Value Date/Time   PHART 7.388 05/21/2014 0418   PCO2ART 36.8 05/21/2014 0418   PO2ART 93.0 05/21/2014 0418   HCO3 22.0  05/21/2014 0418   TCO2 23 05/21/2014 0418   ACIDBASEDEF 2.0 05/21/2014 0418   O2SAT 97.0 05/21/2014 0418    CXR: Mild bibasilar atelectasis and pulm vasc congestion  Assessment/Plan: S/P Procedure(s) (LRB): CORONARY ARTERY BYPASS GRAFTING (CABG) x 4,  LIMA-MID LAD,  SVG-DIAGONAL,  SEQUENTIAL SVG -PDA, PLB #2 (N/A) INTRAOPERATIVE TRANSESOPHAGEAL ECHOCARDIOGRAM (N/A)  Doing well POD1 NSR w/ stable hemodynamics no drips Expected post op acute blood loss anemia, mild Expected post op atelectasis, mild Expected post op volume excess, mild   Mobilize  D/C tubes and lines  Diuresis  Transfer step down   [name] 05/21/2014 7:26 AM

## 2014-05-21 NOTE — Discharge Instructions (Signed)
Endoscopic Saphenous Vein Harvesting °Care After °Refer to this sheet in the next few weeks. These instructions provide you with information on caring for yourself after your procedure. Your caregiver may also give you more specific instructions. Your treatment has been planned according to current medical practices, but problems sometimes occur. Call your caregiver if you have any problems or questions after your procedure. °HOME CARE INSTRUCTIONS °Medicine °· Take whatever pain medicine your surgeon prescribes. Follow the directions carefully. Do not take over-the-counter pain medicine unless your surgeon says it is okay. Some pain medicine can cause bleeding problems for several weeks after surgery. °· Follow your surgeon's instructions about driving. You will probably not be permitted to drive after heart surgery. °· Take any medicines your surgeon prescribes. Any medicines you took before your heart surgery should be checked with your caregiver before you start taking them again. °Wound care °· Ask your surgeon how long you should keep wearing your elastic bandage or stocking. °· Check the area around your surgical cuts (incisions) whenever your bandages (dressings) are changed. Look for any redness or swelling. °· You will need to return to have the stitches (sutures) or staples taken out. Ask your surgeon when to do that. °· Ask your surgeon when you can shower or bathe. °Activity °· Try to keep your legs raised when you are sitting. °· Do any exercises your caregivers have given you. These may include deep breathing exercises, coughing, walking, or other exercises. °SEEK MEDICAL CARE IF: °· You have any questions about your medicines. °· You have more leg pain, especially if your pain medicine stops working. °· New or growing bruises develop on your leg. °· Your leg swells, feels tight, or becomes red. °· You have numbness in your leg. °SEEK IMMEDIATE MEDICAL CARE IF: °· Your pain gets much worse. °· Blood  or fluid leaks from any of the incisions. °· Your incisions become warm, swollen, or red. °· You have chest pain. °· You have trouble breathing. °· You have a fever. °· You have more pain near your leg incision. °MAKE SURE YOU: °· Understand these instructions. °· Will watch your condition. °· Will get help right away if you are not doing well or get worse. °Document Released: 08/29/2011 Document Revised: 03/10/2012 Document Reviewed: 08/29/2011 °ExitCare® Patient Information ©2014 [employer]. °Coronary Artery Bypass Grafting, Care After °Refer to this sheet in the next few weeks. These instructions provide you with information on caring for yourself after your procedure. Your health care provider may also give you more specific instructions. Your treatment has been planned according to current medical practices, but problems sometimes occur. Call your health care provider if you have any problems or questions after your procedure. °WHAT TO EXPECT AFTER THE PROCEDURE °Recovery from surgery will be different for everyone. Some people feel well after 3 or 4 weeks, while for others it takes longer. After your procedure, it is typical to have the following: °· Nausea and a lack of appetite.   °· Constipation. °· Weakness and fatigue.   °· Depression or irritability.   °· Pain or discomfort at your incision site. °HOME CARE INSTRUCTIONS °· Only take over-the-counter or prescription medicines as directed by your health care provider. Take all medicines exactly as directed. Do not stop taking medicines or start any new medicines without first checking with your health care provider.   °· Take your pulse as directed by your health care provider. °· Perform deep breathing as directed by your health care provider. If you were   given a device called an incentive spirometer, use it to practice deep breathing several times a day. Support your chest with a pillow or your arms when you take deep breaths or cough. °· Keep  incision areas clean, dry, and protected. Remove or change any bandages (dressings) only as directed by your health care provider. You may have skin adhesive strips over the incision areas. Do not take the strips off. They will fall off on their own. °· Check incision areas daily for any swelling, redness, or drainage. °· If incisions were made in your legs, do the following: °· Avoid crossing your legs.   °· Avoid sitting for long periods of time. Change positions every 30 minutes.   °· Elevate your legs when you are sitting.   °· Wear compression stockings as directed by your health care provider. These stockings help keep blood clots from forming in your legs. °· Take showers once your health care provider approves. Until then, only take sponge baths. Pat incisions dry. Do not rub incisions with a washcloth or towel. Do not take tub baths or go swimming until your health care provider approves. °· Eat foods that are high in fiber, such as raw fruits and vegetables, whole grains, beans, and nuts. Meats should be lean cut. Avoid canned, processed, and fried foods. °· Drink enough fluids to keep your urine clear or pale yellow. °· Weigh yourself every day. This helps identify if you are retaining fluid that may make your heart and lungs work harder.   °· Rest and limit activity as directed by your health care provider. You may be instructed to: °· Stop any activity at once if you have chest pain, shortness of breath, irregular heartbeats, or dizziness. Get help right away if you have any of these symptoms. °· Move around frequently for short periods or take short walks as directed by your health care provider. Increase your activities gradually. You may need physical therapy or cardiac rehabilitation to help strengthen your muscles and build your endurance. °· Avoid lifting, pushing, or pulling anything heavier than 10 lb (4.5 kg) for at least 6 weeks after surgery. °· Do not drive until your health care provider  approves.  °· Ask your health care provider when you may return to work and resume sexual activity. °· Follow up with your health care provider as directed.   °SEEK MEDICAL CARE IF: °· You have swelling, redness, increasing pain, or drainage at the site of an incision.   °· You develop a fever.   °· You have swelling in your ankles or legs.   °· You have pain in your legs.   °· You have weight gain of 2 or more pounds a day. °· You are nauseous or vomit. °· You have diarrhea.  °SEEK IMMEDIATE MEDICAL CARE IF: °· You have chest pain that goes to your jaw or arms. °· You have shortness of breath.   °· You have a fast or irregular heartbeat.   °· You notice a "clicking" in your breastbone (sternum) when you move.   °· You have numbness or weakness in your arms or legs. °· You feel dizzy or lightheaded.   °MAKE SURE YOU: °· Understand these instructions. °· Will watch your condition. °· Will get help right away if you are not doing well or get worse. °Document Released: 07/06/2005 Document Revised: 08/19/2013 Document Reviewed: 05/26/2013 °ExitCare® Patient Information ©2014 [employer]. ° °

## 2014-05-21 NOTE — Discharge Summary (Signed)
[name]       [place] [address]             [phone]      Physician Discharge Summary  Patient ID: Roger Howe MRN: [id number] DOB/AGE: 09/08/1956 58 y.o.  Admit date: 05/17/2014 Discharge date: 05/24/2014  Admission Diagnoses: Severe CAD/NSTEMI  Discharge Diagnoses:  Principal Problem:   S/P CABG x 4 Active Problems:   Essential hypertension, benign   NSTEMI (non-ST elevated myocardial infarction)   Accelerated essential hypertension   Abdominal aortic atherosclerosis   Coronary artery disease   Discharged Condition: good  History of present illness: The patient is a 58 year old [place] male who presented to the ER with chest pain and left arm pain. This was described as a burning pain and radiated to his back. The patient describes symptoms one week prior to presentation but on date of admission the symptoms were more severe. Additionally there was shortness of breath but he denied nausea, diaphoresis, or lightheadedness. He has no prior cardiac history. He has no prior cardiac history. Cardiac enzymes were positive ruling in NSTEMI. He was admitted for further evaluation and treatment to include cardiac catheterization.   Hospital Course: The patient was admitted by the cardiology service and on 05/18/2049 underwent cardiac catheterization with the following report noted:  CARDIAC CATHETERIZATION REPORT  NAME: Roger Howe MRN: [id number]  DOB: 1956/02/24 ADMIT DATE: 05/17/2014  Procedure Date: 05/18/2014  INTERVENTIONAL CARDIOLOGIST: Leonie Man, M.D., MS  PRIMARY CARE PROVIDER: No primary provider on file.  PRIMARY CARDIOLOGIST: New to [facility]; Leonie Man, MD  PATIENT: Roger Howe is a 58 y.o. [place] male with a family history of a brother having CAD in his 109s, and previously untreated hypertension, who was admitted with a non-STEMI on 05/17/2014. This is after several weeks of unstable angina/crescendo angina symptoms. On  the 18th yet she had resting symptoms as opposed exertional symptoms of chest pressure and dyspnea. He ruled in for positive troponins. His also noted to be profoundly hypertensive with accelerated hypertension. A CTA of the chest and abdomen revealed bowel aortic disease as well as iliac disease but no dissection.  He is now referred for invasive evaluation with cardiac catheterization plus minus PCI.  PRE-OPERATIVE DIAGNOSIS:  Non-STEMI PROCEDURES PERFORMED:  Left Heart Catheterization with Coronary Angiography PROCEDURE:Consent: Risks of procedure as well as the alternatives and risks of each were explained to the (patient/caregiver). Consent for procedure obtained.  Consent for signed by MD and patient with RN witness -- placed on chart. Initially the son acted as interpreter, however on day of the procedure there was a certified interpreter present to ensure all questions were answered and all the risks and benefits were explained.  PROCEDURE: The patient was brought to the [facility] in the fasting state and prepped and draped in the usual sterile fashion for Right groin or radial access. A modified Allen's test with plethysmography was performed, revealing excellent Ulnar artery collateral flow. Sterile technique was used including antiseptics, cap, gloves, gown, hand hygiene, mask and sheet. Skin prep: Chlorhexidine.  Time Out: Verified patient identification, verified procedure, site/side was marked, verified correct patient position, special equipment/implants available, medications/allergies/relevent history reviewed, required imaging and test results available. Performed  Access: Right Radial Artery; 5 Fr Sheath -- Seldinger technique (Angiocath Micropuncture Kit)  IA Radial Cocktail - 10 mL, IV Heparin 4000 Units Diagnostic Left Heart Catheterization with Coronary Angiography: 5 Fr TIG 4.0 and  Angled Pigtail catheters advanced and exchanged over Lon  Exchange Safety J-wire into the ascending aorta and used for selective coronary artery engagement.  Left and Right Coronary Artery Angiography: TIG 4.0  LV Hemodynamics (LV Gram): Angled pigtail TR Band: 1105 Hours, 12 mL air  Hemodynamics:  Central Aortic / Mean Pressures: 101/60/77 mmHg;  Left Ventricular Pressures / EDP: 100/0/4 mmHg; Left Ventriculography:  EF: Roughly 50-55 %  Wall Motion: Essentially normal, with no regional abnormalities identified Coronary Anatomy:  Dominance: Right Left Main: Large-caliber vessel that bifurcates into the LAD, and Circumflex. Angiographically normal. LAD: Normal/large-caliber vessel with proximal tapering to a subtotal 95-99% stenosis at a septal perforator. There is TIMI 2 flow distally with competitive flow from right to left and left to left collaterals. Does appear to be segmental disease in the mid vessel of likely 50-60% . There appear to be several small diagonal branches, due to the slow flow, unable to do to leak identified any significant branches. There is distal retrograde filling from the large lateral OM giving collaterals distally as well as on the RCA leading to significant competitive flow.  Left Circumflex: Large-caliber vessel that gives off a moderate caliber AV groove circumflex before terminating as a large lateral OM that bifurcates distally into a smaller. and larger inferior reaches down to the apex giving left to left collaterals to the distal LAD. The AV groove circumflex gives off a small OM 2, and has a focal 95% stenosis just prior to the bifurcation and OM 2 and a very small distal OM 3.  Ramus intermedius: Moderate caliber vessel that has an appearance of being either a high OM versus a ramus. She'll 50% followed by focal proximal and 80-90% stenosis.  RCA: Very large caliber, dominant vessel there is intermittent ectatic common relatively free of disease. Bifurcates distally into the right posterior descending artery (RPDA)  and the Right Posterior AV Groove Branch (RPAV).  RPDA: Large-caliber vessel with focal ostial 95% stenosis the there are diffuse luminal irregularities in the proximal segment, but distally the vessel reaches the apex and a tortuous manner with minimal luminal irregularities and as it tapered to a very tip there are septal perforator collaterals to the LAD which also demonstrates diagonal branch. The entire course of the LAD is visualized.  RPL Sysytem:The RPAV large-caliber vessel with mild irregularities at the bifurcation of roughly 20%. It gives off bifurcating smaller moderate caliber LPL 1/2 before terminating as a very large, extensive tortuous LPL 3 there has a smaller branch. After reviewing the initial angiography, the culprit lesion was thought to be the proximal subtotally occluded LAD, however in light of the mid LAD lesion, ramus, circumflex and ostial PDA lesions, the decision was made to stop and reassess with possible CABG versus multisite PCI.Marland Kitchen  MEDICATIONS:  Anesthesia: Local Lidocaine 2 ml Sedation: 1 mg IV Versed, 25 mcg IV fentanyl ;  Omnipaque Contrast: 145 ml  Anticoagulation: IV Heparin 4000 Unit Radial Cocktail: 5 mg Verapamil, 400 mcg NTG, 2 ml 2% Lidocaine in 10 ml NS PATIENT DISPOSITION:  The patient was transferred to the PACU holding area in a hemodynamicaly stable, chest pain free condition.  The patient tolerated the procedure well, and there were no complications. EBL: < 5 ml  The patient was stable before, during, and after the procedure. POST-OPERATIVE DIAGNOSIS:  Severe multivessel CAD including proximal LAD, proximal ramus intermedius, ostial PDA, mid AV groove circumflex lesions at least 80-95% stenosis. LAD is essentially subtotally occluded with of left to  left and right-to-left collaterals filling the entire LAD  Each individual lesion is CI minimal, however with multivessel CAD, CABG needs to be considered Mildly reduced EF with normal filling  pressures. PLAN OF CARE:  Return to 6C for post radial cath care. We will restart heparin 6 hours TR band removal  CT Surgery consultation has been called - to discuss options of CABG versus multisite PCI. He will need an interpreter  If blood pressure climbs back up, will restart nitroglycerin David W Harding, M.D., M.S.  [facility]  [address]  [place], [place] [address]  [phone]  05/18/2014  11:11 AM  Due to these findings surgical consultation was obtained with Clarence Owen M.D. who evaluated the patient and studies and agreed with recommendations to proceed with coronary artery surgical revascularization. CARDIOTHORACIC SURGERY OPERATIVE NOTE  Date of Procedure: 05/20/2014  Preoperative Diagnosis: Severe 3-vessel Coronary Artery Disease  Postoperative Diagnosis: Same  Procedure:  Coronary Artery Bypass Grafting x 4  Left Internal Mammary Artery to Mid Left Anterior Descending Coronary Artery  Saphenous Vein Graft to Posterior Descending Coronary Artery with  Sequential Saphenous Vein Graft to Second Right Posterolateral Branch Coronary Artery  Sapheonous Vein Graft to Diagonal Branch Coronary Artery  Endoscopic Vein Harvest from Right Thigh and Lower Leg  Surgeon: Clarence H. Owen, MD  Assistant: Erin Barrett, PA-C  Anesthesia: E. Carswell Jackson, MD  Operative Findings:  Normal LV systolic function  Small caliber but otherwise good quality LIMA conduit for grafting  Good quality SVG conduit for grafting  Intramyocardial LAD  Good quality target vessels for grafting The patient tolerated the procedure well and is transported to the surgical intensive care in stable condition. There are no intraoperative complications. All sponge instrument and needle counts are verified correct at completion of the operation.   Postoperative hospital course: The patient is doing well. He was extubated using standard protocols without difficulty. He  required no inotropic support. He is maintaining normal sinus rhythm. He has a mild expected acute blood loss anemia. He has a mild postoperative volume overload. All routine lines, monitors and drainage devices have been discontinued in the standard fashion. He is tolerating gradually increasing activities using standard cardiac rehabilitation protocols. Incisions are healing well without evidence of infection. Oxygen has been weaned and he maintains good saturations on room air. His overall status is felt to be stable for discharge in the next 48 or so hours pending ongoing reevaluation of his recovery.   Consults: None   Discharge Exam: Blood pressure 128/72, pulse 79, temperature 100.4 F (38 C), temperature source Core (Comment), resp. rate 29, height 5' 7" (1.702 m), weight 182 lb 3.2 oz (82.645 kg), SpO2 100.00%. Disposition:discharged home  Medications at the time of discharge:    Medication List         aspirin 325 MG tablet  Take 325 mg by mouth daily as needed (pain).     atorvastatin 20 MG tablet  Commonly known as:  LIPITOR  Take 1 tablet (20 mg total) by mouth daily at 6 PM.     lisinopril 10 MG tablet  Commonly known as:  PRINIVIL,ZESTRIL  Take 1 tablet (10 mg total) by mouth daily.     metoprolol tartrate 25 MG tablet  Commonly known as:  LOPRESSOR  Take 1 tablet (25 mg total) by mouth 2 (two) times daily.     oxyCODONE 5 MG immediate release tablet  Commonly known as:  Oxy IR/ROXICODONE    Take 1-2 tablets (5-10 mg total) by mouth every 3 (three) hours as needed for moderate pain.         The patient has been discharged on:   1.Beta Blocker:  Yes [ x  ]                              No   [   ]                              If No, reason:  2.Ace Inhibitor/ARB: Yes [x   ]                                     No  [    ]                                     If No, reason:  3.Statin:   Yes [x   ]                  No  [   ]                  If No,  reason:  4.Ecasa:  Yes  [x   ]                  No   [   ]                  If No, reason:         Follow-up Information   Follow up with OWEN,CLARENCE H, MD. (06/14/2014 at 12:15 PM. Please obtain a chest x-ray at 11:45 AM at [facility]. [facility] is located in the same office complex.)    Specialty:  Cardiothoracic Surgery   Contact information:   [address] [place] [address] [phone]       Follow up with HARDING,DAVID W, MD. (2 weeks-please contact the office to m appointment with cardiologist if it has not already been arranged.)    Specialty:  Cardiology   Contact information:   [address] [place] [address] [phone]       Signed: Wayne E Gold 05/21/2014, 1:34 PM   

## 2014-05-21 NOTE — Progress Notes (Signed)
Subjective:  POD #1 CABG X 4, extubated  Objective:  Temp:  [96.6 F (35.9 C)-100 F (37.8 C)] 100 F (37.8 C) (05/22 0800) Pulse Rate:  [77-88] 77 (05/22 0800) Resp:  [12-27] 26 (05/22 0700) BP: (88-121)/(57-78) 105/57 mmHg (05/22 0800) SpO2:  [99 %-100 %] 100 % (05/22 0800) Arterial Line BP: (96-157)/(54-77) 125/57 mmHg (05/22 0800) FiO2 (%):  [40 %-50 %] 40 % (05/21 1509) Weight:  [182 lb 3.2 oz (82.645 kg)] 182 lb 3.2 oz (82.645 kg) (05/22 0500) Weight change: 5 lb 6.3 oz (2.445 kg)  Intake/Output from previous day: 05/21 0701 - 05/22 0700 In: 6250.7 [P.O.:240; I.V.:4050.7; Blood:330; NG/GT:30; IV Piggyback:1600] Out: 3244 [Urine:4500; Blood:1570; Chest Tube:450]  Intake/Output from this shift: Total I/O In: 90 [I.V.:40; IV Piggyback:50] Out: 80 [Urine:30; Chest Tube:50]  Physical Exam: General appearance: alert, no distress and Languafe barrier, unable to communicate Neck: no adenopathy, no carotid bruit, no JVD, supple, symmetrical, trachea midline and thyroid not enlarged, symmetric, no tenderness/mass/nodules Lungs: clear to auscultation bilaterally Heart: regular rate and rhythm, S1, S2 normal, no murmur, click, rub or gallop Extremities: extremities normal, atraumatic, no cyanosis or edema  Lab Results: Results for orders placed during the hospital encounter of 05/17/14 (from the past 48 hour(s))  HEPARIN LEVEL (UNFRACTIONATED)     Status: None   Collection Time    05/19/14  7:46 PM      Result Value Ref Range   Heparin Unfractionated 0.35  0.30 - 0.70 IU/mL   Comment:            IF HEPARIN RESULTS ARE BELOW     EXPECTED VALUES, AND PATIENT     DOSAGE HAS BEEN CONFIRMED,     SUGGEST FOLLOW UP TESTING     OF ANTITHROMBIN III LEVELS.  CBC     Status: Abnormal   Collection Time    05/20/14  3:05 AM      Result Value Ref Range   WBC 9.1  4.0 - 10.5 K/uL   RBC 4.60  4.22 - 5.81 MIL/uL   Hemoglobin 12.5 (*) 13.0 - 17.0 g/dL   HCT 37.3 (*) 39.0 - 52.0  %   MCV 81.1  78.0 - 100.0 fL   MCH 27.2  26.0 - 34.0 pg   MCHC 33.5  30.0 - 36.0 g/dL   RDW 13.6  11.5 - 15.5 %   Platelets 277  150 - 400 K/uL  HEPARIN LEVEL (UNFRACTIONATED)     Status: Abnormal   Collection Time    05/20/14  3:05 AM      Result Value Ref Range   Heparin Unfractionated <0.10 (*) 0.30 - 0.70 IU/mL   Comment:            IF HEPARIN RESULTS ARE BELOW     EXPECTED VALUES, AND PATIENT     DOSAGE HAS BEEN CONFIRMED,     SUGGEST FOLLOW UP TESTING     OF ANTITHROMBIN III LEVELS.     REPEATED TO VERIFY  BASIC METABOLIC PANEL     Status: Abnormal   Collection Time    05/20/14  3:05 AM      Result Value Ref Range   Sodium 141  137 - 147 mEq/L   Potassium 3.6 (*) 3.7 - 5.3 mEq/L   Chloride 105  96 - 112 mEq/L   CO2 23  19 - 32 mEq/L   Glucose, Bld 95  70 - 99 mg/dL   BUN 12  6 -  23 mg/dL   Creatinine, Ser 0.92  0.50 - 1.35 mg/dL   Calcium 8.9  8.4 - 10.5 mg/dL   GFR calc non Af Amer >90  >90 mL/min   GFR calc Af Amer >90  >90 mL/min   Comment: (NOTE)     The eGFR has been calculated using the CKD EPI equation.     This calculation has not been validated in all clinical situations.     eGFR's persistently <90 mL/min signify possible Chronic Kidney     Disease.  APTT     Status: None   Collection Time    05/20/14  3:05 AM      Result Value Ref Range   aPTT 32  24 - 37 seconds  POCT I-STAT 4, (NA,K, GLUC, HGB,HCT)     Status: Abnormal   Collection Time    05/20/14  8:19 AM      Result Value Ref Range   Sodium 142  137 - 147 mEq/L   Potassium 3.5 (*) 3.7 - 5.3 mEq/L   Glucose, Bld 99  70 - 99 mg/dL   HCT 34.0 (*) 39.0 - 52.0 %   Hemoglobin 11.6 (*) 13.0 - 17.0 g/dL  POCT I-STAT GLUCOSE     Status: Abnormal   Collection Time    05/20/14  9:26 AM      Result Value Ref Range   Operator id [id number]     Glucose, Bld 105 (*) 70 - 99 mg/dL  POCT I-STAT 4, (NA,K, GLUC, HGB,HCT)     Status: Abnormal   Collection Time    05/20/14  9:51 AM      Result Value Ref Range    Sodium 142  137 - 147 mEq/L   Potassium 3.5 (*) 3.7 - 5.3 mEq/L   Glucose, Bld 97  70 - 99 mg/dL   HCT 32.0 (*) 39.0 - 52.0 %   Hemoglobin 10.9 (*) 13.0 - 17.0 g/dL  POCT I-STAT 4, (NA,K, GLUC, HGB,HCT)     Status: Abnormal   Collection Time    05/20/14 10:15 AM      Result Value Ref Range   Sodium 138  137 - 147 mEq/L   Potassium 4.6  3.7 - 5.3 mEq/L   Glucose, Bld 91  70 - 99 mg/dL   HCT 25.0 (*) 39.0 - 52.0 %   Hemoglobin 8.5 (*) 13.0 - 17.0 g/dL  POCT I-STAT 3, ART BLOOD GAS (G3+)     Status: Abnormal   Collection Time    05/20/14 10:19 AM      Result Value Ref Range   pH, Arterial 7.383  7.350 - 7.450   pCO2 arterial 40.3  35.0 - 45.0 mmHg   pO2, Arterial 343.0 (*) 80.0 - 100.0 mmHg   Bicarbonate 24.0  20.0 - 24.0 mEq/L   TCO2 25  0 - 100 mmol/L   O2 Saturation 100.0     Acid-base deficit 1.0  0.0 - 2.0 mmol/L   Sample type ARTERIAL    HEMOGLOBIN AND HEMATOCRIT, BLOOD     Status: Abnormal   Collection Time    05/20/14 11:00 AM      Result Value Ref Range   Hemoglobin 9.2 (*) 13.0 - 17.0 g/dL   Comment: REPEATED TO VERIFY     DELTA CHECK NOTED     RESULT CALLED TO, READ BACK BY AND VERIFIED WITH:     LAWLESS,C RN _0  05.21.15 BY GRINSTEAD,C   HCT 26.7 (*) 39.0 - 52.0 %  Comment: REPEATED TO VERIFY     DELTA CHECK NOTED     RESULT CALLED TO, READ BACK BY AND VERIFIED WITH:     LAWLESS,C RN _0  05.21.15 BY GRINSTEAD,C  PLATELET COUNT     Status: None   Collection Time    05/20/14 11:00 AM      Result Value Ref Range   Platelets 179  150 - 400 K/uL   Comment: REPEATED TO VERIFY     DELTA CHECK NOTED     RESULT CALLED TO, READ BACK BY AND VERIFIED WITH:     LAWLESS,C RN _1  05.21.15 BY GRINSTEAD,C  POCT I-STAT 4, (NA,K, GLUC, HGB,HCT)     Status: Abnormal   Collection Time    05/20/14 11:04 AM      Result Value Ref Range   Sodium 136 (*) 137 - 147 mEq/L   Potassium 5.1  3.7 - 5.3 mEq/L   Glucose, Bld 104 (*) 70 - 99 mg/dL   HCT 27.0 (*) 39.0 - 52.0 %     Hemoglobin 9.2 (*) 13.0 - 17.0 g/dL  POCT I-STAT 4, (NA,K, GLUC, HGB,HCT)     Status: Abnormal   Collection Time    05/20/14 11:37 AM      Result Value Ref Range   Sodium 136 (*) 137 - 147 mEq/L   Potassium 4.7  3.7 - 5.3 mEq/L   Glucose, Bld 117 (*) 70 - 99 mg/dL   HCT 26.0 (*) 39.0 - 52.0 %   Hemoglobin 8.8 (*) 13.0 - 17.0 g/dL  CBC     Status: Abnormal   Collection Time    05/20/14  1:00 PM      Result Value Ref Range   WBC 12.5 (*) 4.0 - 10.5 K/uL   RBC 3.50 (*) 4.22 - 5.81 MIL/uL   Hemoglobin 9.9 (*) 13.0 - 17.0 g/dL   HCT 27.8 (*) 39.0 - 52.0 %   MCV 79.4  78.0 - 100.0 fL   MCH 28.3  26.0 - 34.0 pg   MCHC 35.6  30.0 - 36.0 g/dL   RDW 13.4  11.5 - 15.5 %   Platelets 136 (*) 150 - 400 K/uL   Comment: DELTA CHECK NOTED     SPECIMEN CHECKED FOR CLOTS     REPEATED TO VERIFY  PROTIME-INR     Status: Abnormal   Collection Time    05/20/14  1:00 PM      Result Value Ref Range   Prothrombin Time 18.5 (*) 11.6 - 15.2 seconds   INR 1.59 (*) 0.00 - 1.49  APTT     Status: None   Collection Time    05/20/14  1:00 PM      Result Value Ref Range   aPTT 35  24 - 37 seconds  POCT I-STAT 3, ART BLOOD GAS (G3+)     Status: Abnormal   Collection Time    05/20/14  1:01 PM      Result Value Ref Range   pH, Arterial 7.389  7.350 - 7.450   pCO2 arterial 34.1 (*) 35.0 - 45.0 mmHg   pO2, Arterial 70.0 (*) 80.0 - 100.0 mmHg   Bicarbonate 20.9  20.0 - 24.0 mEq/L   TCO2 22  0 - 100 mmol/L   O2 Saturation 95.0     Acid-base deficit 4.0 (*) 0.0 - 2.0 mmol/L   Patient temperature 96.0 F     Sample type ARTERIAL    POCT I-STAT 4, (NA,K, GLUC, HGB,HCT)  Status: Abnormal   Collection Time    05/20/14  1:05 PM      Result Value Ref Range   Sodium 142  137 - 147 mEq/L   Potassium 3.7  3.7 - 5.3 mEq/L   Glucose, Bld 106 (*) 70 - 99 mg/dL   HCT 30.0 (*) 39.0 - 52.0 %   Hemoglobin 10.2 (*) 13.0 - 17.0 g/dL  GLUCOSE, CAPILLARY     Status: Abnormal   Collection Time    05/20/14  2:04 PM       Result Value Ref Range   Glucose-Capillary 118 (*) 70 - 99 mg/dL  GLUCOSE, CAPILLARY     Status: Abnormal   Collection Time    05/20/14  3:12 PM      Result Value Ref Range   Glucose-Capillary 112 (*) 70 - 99 mg/dL  POCT I-STAT 3, ART BLOOD GAS (G3+)     Status: Abnormal   Collection Time    05/20/14  3:30 PM      Result Value Ref Range   pH, Arterial 7.316 (*) 7.350 - 7.450   pCO2 arterial 41.6  35.0 - 45.0 mmHg   pO2, Arterial 88.0  80.0 - 100.0 mmHg   Bicarbonate 21.1  20.0 - 24.0 mEq/L   TCO2 22  0 - 100 mmol/L   O2 Saturation 96.0     Acid-base deficit 5.0 (*) 0.0 - 2.0 mmol/L   Patient temperature 37.4 C     Collection site RADIAL, ALLEN'S TEST ACCEPTABLE     Drawn by Nurse     Sample type ARTERIAL    GLUCOSE, CAPILLARY     Status: Abnormal   Collection Time    05/20/14  4:06 PM      Result Value Ref Range   Glucose-Capillary 117 (*) 70 - 99 mg/dL  POCT I-STAT 3, ART BLOOD GAS (G3+)     Status: None   Collection Time    05/20/14  4:09 PM      Result Value Ref Range   pH, Arterial 7.359  7.350 - 7.450   pCO2 arterial 42.0  35.0 - 45.0 mmHg   pO2, Arterial 99.0  80.0 - 100.0 mmHg   Bicarbonate 23.5  20.0 - 24.0 mEq/L   TCO2 25  0 - 100 mmol/L   O2 Saturation 97.0     Acid-base deficit 2.0  0.0 - 2.0 mmol/L   Patient temperature 37.5 C     Collection site RADIAL, ALLEN'S TEST ACCEPTABLE     Drawn by Nurse     Sample type ARTERIAL    GLUCOSE, CAPILLARY     Status: Abnormal   Collection Time    05/20/14  5:22 PM      Result Value Ref Range   Glucose-Capillary 114 (*) 70 - 99 mg/dL  GLUCOSE, CAPILLARY     Status: None   Collection Time    05/20/14  6:18 PM      Result Value Ref Range   Glucose-Capillary 98  70 - 99 mg/dL  CBC     Status: Abnormal   Collection Time    05/20/14  6:40 PM      Result Value Ref Range   WBC 18.4 (*) 4.0 - 10.5 K/uL   RBC 3.62 (*) 4.22 - 5.81 MIL/uL   Hemoglobin 10.1 (*) 13.0 - 17.0 g/dL   HCT 28.9 (*) 39.0 - 52.0 %   MCV  79.8  78.0 - 100.0 fL   MCH 27.9  26.0 -  34.0 pg   MCHC 34.9  30.0 - 36.0 g/dL   RDW 13.8  11.5 - 15.5 %   Platelets 175  150 - 400 K/uL  MAGNESIUM     Status: Abnormal   Collection Time    05/20/14  6:40 PM      Result Value Ref Range   Magnesium 3.1 (*) 1.5 - 2.5 mg/dL  CREATININE, SERUM     Status: None   Collection Time    05/20/14  6:40 PM      Result Value Ref Range   Creatinine, Ser 0.78  0.50 - 1.35 mg/dL   GFR calc non Af Amer >90  >90 mL/min   GFR calc Af Amer >90  >90 mL/min   Comment: (NOTE)     The eGFR has been calculated using the CKD EPI equation.     This calculation has not been validated in all clinical situations.     eGFR's persistently <90 mL/min signify possible Chronic Kidney     Disease.  POCT I-STAT, CHEM 8     Status: Abnormal   Collection Time    05/20/14  6:41 PM      Result Value Ref Range   Sodium 143  137 - 147 mEq/L   Potassium 4.2  3.7 - 5.3 mEq/L   Chloride 108  96 - 112 mEq/L   BUN 5 (*) 6 - 23 mg/dL   Creatinine, Ser 0.80  0.50 - 1.35 mg/dL   Glucose, Bld 112 (*) 70 - 99 mg/dL   Calcium, Ion 1.17  1.12 - 1.23 mmol/L   TCO2 23  0 - 100 mmol/L   Hemoglobin 9.5 (*) 13.0 - 17.0 g/dL   HCT 28.0 (*) 39.0 - 52.0 %  GLUCOSE, CAPILLARY     Status: None   Collection Time    05/20/14  7:23 PM      Result Value Ref Range   Glucose-Capillary 98  70 - 99 mg/dL   Comment 1 Documented in Chart     Comment 2 Notify RN    GLUCOSE, CAPILLARY     Status: Abnormal   Collection Time    05/20/14 11:46 PM      Result Value Ref Range   Glucose-Capillary 108 (*) 70 - 99 mg/dL   Comment 1 Documented in Chart     Comment 2 Notify RN    GLUCOSE, CAPILLARY     Status: Abnormal   Collection Time    05/21/14  3:41 AM      Result Value Ref Range   Glucose-Capillary 110 (*) 70 - 99 mg/dL   Comment 1 Documented in Chart     Comment 2 Notify RN    CBC     Status: Abnormal   Collection Time    05/21/14  4:15 AM      Result Value Ref Range   WBC 13.5 (*)  4.0 - 10.5 K/uL   RBC 3.55 (*) 4.22 - 5.81 MIL/uL   Hemoglobin 10.0 (*) 13.0 - 17.0 g/dL   HCT 28.6 (*) 39.0 - 52.0 %   MCV 80.6  78.0 - 100.0 fL   MCH 28.2  26.0 - 34.0 pg   MCHC 35.0  30.0 - 36.0 g/dL   RDW 13.8  11.5 - 15.5 %   Platelets 153  150 - 400 K/uL  BASIC METABOLIC PANEL     Status: Abnormal   Collection Time    05/21/14  4:15 AM      Result  Value Ref Range   Sodium 138  137 - 147 mEq/L   Potassium 4.1  3.7 - 5.3 mEq/L   Chloride 104  96 - 112 mEq/L   CO2 20  19 - 32 mEq/L   Glucose, Bld 124 (*) 70 - 99 mg/dL   BUN 7  6 - 23 mg/dL   Creatinine, Ser 0.73  0.50 - 1.35 mg/dL   Calcium 8.0 (*) 8.4 - 10.5 mg/dL   GFR calc non Af Amer >90  >90 mL/min   GFR calc Af Amer >90  >90 mL/min   Comment: (NOTE)     The eGFR has been calculated using the CKD EPI equation.     This calculation has not been validated in all clinical situations.     eGFR's persistently <90 mL/min signify possible Chronic Kidney     Disease.  MAGNESIUM     Status: None   Collection Time    05/21/14  4:15 AM      Result Value Ref Range   Magnesium 2.5  1.5 - 2.5 mg/dL  POCT I-STAT 3, ART BLOOD GAS (G3+)     Status: None   Collection Time    05/21/14  4:18 AM      Result Value Ref Range   pH, Arterial 7.388  7.350 - 7.450   pCO2 arterial 36.8  35.0 - 45.0 mmHg   pO2, Arterial 93.0  80.0 - 100.0 mmHg   Bicarbonate 22.0  20.0 - 24.0 mEq/L   TCO2 23  0 - 100 mmol/L   O2 Saturation 97.0     Acid-base deficit 2.0  0.0 - 2.0 mmol/L   Patient temperature 99.6 F     Collection site ARTERIAL LINE     Drawn by Nurse     Sample type ARTERIAL    GLUCOSE, CAPILLARY     Status: Abnormal   Collection Time    05/21/14  7:30 AM      Result Value Ref Range   Glucose-Capillary 121 (*) 70 - 99 mg/dL   Comment 1 Notify RN      Imaging: Imaging results have been reviewed  Assessment/Plan:   1. Principal Problem: 2.   S/P CABG x 4 3. Active Problems: 4.   Essential hypertension, benign 5.   NSTEMI  (non-ST elevated myocardial infarction) 6.   Accelerated essential hypertension 7.   Abdominal aortic atherosclerosis 8.   Coronary artery disease 9.   Time Spent Directly with Patient:  20 minutes  Length of Stay:  LOS: 4 days   POD #1 CABG X 4, NSTEMI, 3VD with moderate LV dysfunction. Off drips. NSR. VSS. Still has SGC. Nl progression per TCTS.  Lorretta Harp 05/21/2014, 9:24 AM

## 2014-05-22 ENCOUNTER — Inpatient Hospital Stay (HOSPITAL_COMMUNITY): Payer: 59

## 2014-05-22 LAB — BASIC METABOLIC PANEL
BUN: 11 mg/dL (ref 6–23)
CALCIUM: 8.3 mg/dL — AB (ref 8.4–10.5)
CHLORIDE: 99 meq/L (ref 96–112)
CO2: 24 meq/L (ref 19–32)
Creatinine, Ser: 0.79 mg/dL (ref 0.50–1.35)
GFR calc Af Amer: >90 mL/min (ref >90)
GFR calc non Af Amer: >90 mL/min (ref >90)
GLUCOSE: 127 mg/dL — AB (ref 70–99)
Potassium: 3.9 mEq/L (ref 3.7–5.3)
SODIUM: 136 meq/L — AB (ref 137–147)

## 2014-05-22 LAB — GLUCOSE, CAPILLARY
GLUCOSE-CAPILLARY: 131 mg/dL — AB (ref 70–99)
GLUCOSE-CAPILLARY: 132 mg/dL — AB (ref 70–99)
Glucose-Capillary: 132 mg/dL — ABNORMAL HIGH (ref 70–99)
Glucose-Capillary: 103 mg/dL — ABNORMAL HIGH (ref 70–99)

## 2014-05-22 LAB — CBC
HCT: 31 % — ABNORMAL LOW (ref 39.0–52.0)
HEMOGLOBIN: 10.5 g/dL — AB (ref 13.0–17.0)
MCH: 27.6 pg (ref 26.0–34.0)
MCHC: 33.9 g/dL (ref 30.0–36.0)
MCV: 81.6 fL (ref 78.0–100.0)
Platelets: 221 10*3/uL (ref 150–400)
RBC: 3.8 MIL/uL — ABNORMAL LOW (ref 4.22–5.81)
RDW: 13.9 % (ref 11.5–15.5)
WBC: 21.4 10*3/uL — ABNORMAL HIGH (ref 4.0–10.5)

## 2014-05-22 MED ORDER — METOPROLOL TARTRATE 25 MG PO TABS
25.0000 mg | ORAL_TABLET | Freq: Two times a day (BID) | ORAL | Status: DC
  Administered 2014-05-22 – 2014-05-24 (×5): 25 mg via ORAL
  Filled 2014-05-22 (×7): qty 1

## 2014-05-22 NOTE — Progress Notes (Addendum)
      [address]       [place] [address]             [phone]      2 Days Post-Op Procedure(s) (LRB): CORONARY ARTERY BYPASS GRAFTING (CABG) x 4,  LIMA-MID LAD,  SVG-DIAGONAL,  SEQUENTIAL SVG -PDA, PLB #2 (N/A) INTRAOPERATIVE TRANSESOPHAGEAL ECHOCARDIOGRAM (N/A)  Subjective:  Patient is without complaints this morning.  He states feeling okay.  He is ambulating with minimal assistance.  Family at bedside to provide translation  Objective: Vital signs in last 24 hours: Temp:  [98.2 F (36.8 C)-100.4 F (38 C)] 98.7 F (37.1 C) (05/23 0545) Pulse Rate:  [79-87] 87 (05/23 0545) Cardiac Rhythm:  [-] Normal sinus rhythm (05/22 2133) Resp:  [20-30] 20 (05/23 0545) BP: (104-142)/(61-83) 128/72 mmHg (05/23 0545) SpO2:  [90 %-100 %] 90 % (05/23 0545) Arterial Line BP: (136-157)/(60-74) 157/74 mmHg (05/22 1100) Weight:  [179 lb 7.3 oz (81.4 kg)] 179 lb 7.3 oz (81.4 kg) (05/23 0545)  Hemodynamic parameters for last 24 hours: PAP: (25-39)/(8-25) 38/23 mmHg  Intake/Output from previous day: 05/22 0701 - 05/23 0700 In: 620 [P.O.:480; I.V.:40; IV Piggyback:100] Out: 1650 [Urine:1450; Chest Tube:200]  General appearance: alert, cooperative and no distress Heart: regular rate and rhythm Lungs: clear to auscultation bilaterally Abdomen: soft, non-tender; bowel sounds normal; no masses,  no organomegaly Extremities: edema trace Wound: clean and dry  Lab Results:  Recent Labs  05/21/14 1700 05/22/14 0320  WBC 22.2* 21.4*  HGB 11.3* 10.5*  HCT 33.2* 31.0*  PLT 219 221   BMET:  Recent Labs  05/21/14 0415 05/21/14 1700 05/22/14 0320  NA 138  --  136*  K 4.1  --  3.9  CL 104  --  99  CO2 20  --  24  GLUCOSE 124*  --  127*  BUN 7  --  11  CREATININE 0.73 0.87 0.79  CALCIUM 8.0*  --  8.3*    PT/INR:  Recent Labs  05/20/14 1300  LABPROT 18.5*  INR 1.59*   ABG    Component Value Date/Time   PHART 7.388 05/21/2014 0418   HCO3 22.0 05/21/2014  0418   TCO2 23 05/21/2014 0418   ACIDBASEDEF 2.0 05/21/2014 0418   O2SAT 97.0 05/21/2014 0418   CBG (last 3)   Recent Labs  05/21/14 1640 05/21/14 2039 05/22/14 0015  GLUCAP 135* 111* 131*    Assessment/Plan: S/P Procedure(s) (LRB): CORONARY ARTERY BYPASS GRAFTING (CABG) x 4,  LIMA-MID LAD,  SVG-DIAGONAL,  SEQUENTIAL SVG -PDA, PLB #2 (N/A) INTRAOPERATIVE TRANSESOPHAGEAL ECHOCARDIOGRAM (N/A)  1. CV- NSR, mildly tachy this morning- will increase Lopressor to 25 mg BID, continue Lisinopril 2. Pulm- off oxygen, no acute issues, encouraged use of IS 3. Renal- creatinine WNL, mildly hypervolemic- Lasix ordered 4. Expected Acute Blood Loss Anemia- Hgb at 10.5 5. CBGs controlled, patient is not a diabetic will d/c insulin and glucose checks 6. Dispo- patient looks great, will monitor today if rhythm remains stable will plan to d/c EPW tomorrow, likely home Monday   LOS: 5 days    Lowella Dandy 05/22/2014  I have seen and examined the patient and agree with the assessment and plan as outlined.  Watch WBC.  Anticipate possible d/c home on Monday.  Discussed progress w/ patient w/ nephew as an interpreter.  [name] 05/22/2014 11:38 AM

## 2014-05-22 NOTE — Progress Notes (Signed)
Subjective:  Feeling well. Language barrier ([place] ) but Nephew was interpreting  Objective:  Temp:  [98.2 F (36.8 C)-100.4 F (38 C)] 98.7 F (37.1 C) (05/23 0545) Pulse Rate:  [79-87] 81 (05/23 0957) Resp:  [20-29] 20 (05/23 0545) BP: (113-142)/(65-83) 121/79 mmHg (05/23 0957) SpO2:  [90 %-100 %] 97 % (05/23 0957) Arterial Line BP: (157)/(74) 157/74 mmHg (05/22 1100) Weight:  [179 lb 7.3 oz (81.4 kg)] 179 lb 7.3 oz (81.4 kg) (05/23 0545) Weight change: -2 lb 11.9 oz (-1.245 kg)  Intake/Output from previous day: 05/22 0701 - 05/23 0700 In: 620 [P.O.:480; I.V.:40; IV Piggyback:100] Out: 1650 [Urine:1450; Chest Tube:200]  Intake/Output from this shift: Total I/O In: 240 [P.O.:240] Out: 550 [Urine:550]  Physical Exam: General appearance: alert and no distress Neck: no adenopathy, no carotid bruit, no JVD, supple, symmetrical, trachea midline and thyroid not enlarged, symmetric, no tenderness/mass/nodules Lungs: clear to auscultation bilaterally Heart: regular rate and rhythm, S1, S2 normal, no murmur, click, rub or gallop Extremities: extremities normal, atraumatic, no cyanosis or edema  Lab Results: Results for orders placed during the hospital encounter of 05/17/14 (from the past 48 hour(s))  POCT I-STAT 4, (NA,K, GLUC, HGB,HCT)     Status: Abnormal   Collection Time    05/20/14 10:15 AM      Result Value Ref Range   Sodium 138  137 - 147 mEq/L   Potassium 4.6  3.7 - 5.3 mEq/L   Glucose, Bld 91  70 - 99 mg/dL   HCT 25.0 (*) 39.0 - 52.0 %   Hemoglobin 8.5 (*) 13.0 - 17.0 g/dL  POCT I-STAT 3, ART BLOOD GAS (G3+)     Status: Abnormal   Collection Time    05/20/14 10:19 AM      Result Value Ref Range   pH, Arterial 7.383  7.350 - 7.450   pCO2 arterial 40.3  35.0 - 45.0 mmHg   pO2, Arterial 343.0 (*) 80.0 - 100.0 mmHg   Bicarbonate 24.0  20.0 - 24.0 mEq/L   TCO2 25  0 - 100 mmol/L   O2 Saturation 100.0     Acid-base deficit 1.0  0.0 - 2.0 mmol/L   Sample type ARTERIAL    HEMOGLOBIN AND HEMATOCRIT, BLOOD     Status: Abnormal   Collection Time    05/20/14 11:00 AM      Result Value Ref Range   Hemoglobin 9.2 (*) 13.0 - 17.0 g/dL   Comment: REPEATED TO VERIFY     DELTA CHECK NOTED     RESULT CALLED TO, READ BACK BY AND VERIFIED WITH:     LAWLESS,C RN @1120  05.21.15 BY GRINSTEAD,C   HCT 26.7 (*) 39.0 - 52.0 %   Comment: REPEATED TO VERIFY     DELTA CHECK NOTED     RESULT CALLED TO, READ BACK BY AND VERIFIED WITH:     LAWLESS,C RN @1120  05.21.15 BY GRINSTEAD,C  PLATELET COUNT     Status: None   Collection Time    05/20/14 11:00 AM      Result Value Ref Range   Platelets 179  150 - 400 K/uL   Comment: REPEATED TO VERIFY     DELTA CHECK NOTED     RESULT CALLED TO, READ BACK BY AND VERIFIED WITH:     LAWLESS,C RN @1120  05.21.15 BY GRINSTEAD,C  POCT I-STAT 4, (NA,K, GLUC, HGB,HCT)     Status: Abnormal   Collection Time    05/20/14 11:04 AM  Result Value Ref Range   Sodium 136 (*) 137 - 147 mEq/L   Potassium 5.1  3.7 - 5.3 mEq/L   Glucose, Bld 104 (*) 70 - 99 mg/dL   HCT 27.0 (*) 39.0 - 52.0 %   Hemoglobin 9.2 (*) 13.0 - 17.0 g/dL  POCT I-STAT 4, (NA,K, GLUC, HGB,HCT)     Status: Abnormal   Collection Time    05/20/14 11:37 AM      Result Value Ref Range   Sodium 136 (*) 137 - 147 mEq/L   Potassium 4.7  3.7 - 5.3 mEq/L   Glucose, Bld 117 (*) 70 - 99 mg/dL   HCT 26.0 (*) 39.0 - 52.0 %   Hemoglobin 8.8 (*) 13.0 - 17.0 g/dL  CBC     Status: Abnormal   Collection Time    05/20/14  1:00 PM      Result Value Ref Range   WBC 12.5 (*) 4.0 - 10.5 K/uL   RBC 3.50 (*) 4.22 - 5.81 MIL/uL   Hemoglobin 9.9 (*) 13.0 - 17.0 g/dL   HCT 27.8 (*) 39.0 - 52.0 %   MCV 79.4  78.0 - 100.0 fL   MCH 28.3  26.0 - 34.0 pg   MCHC 35.6  30.0 - 36.0 g/dL   RDW 13.4  11.5 - 15.5 %   Platelets 136 (*) 150 - 400 K/uL   Comment: DELTA CHECK NOTED     SPECIMEN CHECKED FOR CLOTS     REPEATED TO VERIFY  PROTIME-INR     Status: Abnormal    Collection Time    05/20/14  1:00 PM      Result Value Ref Range   Prothrombin Time 18.5 (*) 11.6 - 15.2 seconds   INR 1.59 (*) 0.00 - 1.49  APTT     Status: None   Collection Time    05/20/14  1:00 PM      Result Value Ref Range   aPTT 35  24 - 37 seconds  POCT I-STAT 3, ART BLOOD GAS (G3+)     Status: Abnormal   Collection Time    05/20/14  1:01 PM      Result Value Ref Range   pH, Arterial 7.389  7.350 - 7.450   pCO2 arterial 34.1 (*) 35.0 - 45.0 mmHg   pO2, Arterial 70.0 (*) 80.0 - 100.0 mmHg   Bicarbonate 20.9  20.0 - 24.0 mEq/L   TCO2 22  0 - 100 mmol/L   O2 Saturation 95.0     Acid-base deficit 4.0 (*) 0.0 - 2.0 mmol/L   Patient temperature 96.0 F     Sample type ARTERIAL    POCT I-STAT 4, (NA,K, GLUC, HGB,HCT)     Status: Abnormal   Collection Time    05/20/14  1:05 PM      Result Value Ref Range   Sodium 142  137 - 147 mEq/L   Potassium 3.7  3.7 - 5.3 mEq/L   Glucose, Bld 106 (*) 70 - 99 mg/dL   HCT 30.0 (*) 39.0 - 52.0 %   Hemoglobin 10.2 (*) 13.0 - 17.0 g/dL  GLUCOSE, CAPILLARY     Status: Abnormal   Collection Time    05/20/14  2:04 PM      Result Value Ref Range   Glucose-Capillary 118 (*) 70 - 99 mg/dL  GLUCOSE, CAPILLARY     Status: Abnormal   Collection Time    05/20/14  3:12 PM      Result Value Ref  Range   Glucose-Capillary 112 (*) 70 - 99 mg/dL  POCT I-STAT 3, ART BLOOD GAS (G3+)     Status: Abnormal   Collection Time    05/20/14  3:30 PM      Result Value Ref Range   pH, Arterial 7.316 (*) 7.350 - 7.450   pCO2 arterial 41.6  35.0 - 45.0 mmHg   pO2, Arterial 88.0  80.0 - 100.0 mmHg   Bicarbonate 21.1  20.0 - 24.0 mEq/L   TCO2 22  0 - 100 mmol/L   O2 Saturation 96.0     Acid-base deficit 5.0 (*) 0.0 - 2.0 mmol/L   Patient temperature 37.4 C     Collection site RADIAL, ALLEN'S TEST ACCEPTABLE     Drawn by Nurse     Sample type ARTERIAL    GLUCOSE, CAPILLARY     Status: Abnormal   Collection Time    05/20/14  4:06 PM      Result Value Ref  Range   Glucose-Capillary 117 (*) 70 - 99 mg/dL  POCT I-STAT 3, ART BLOOD GAS (G3+)     Status: None   Collection Time    05/20/14  4:09 PM      Result Value Ref Range   pH, Arterial 7.359  7.350 - 7.450   pCO2 arterial 42.0  35.0 - 45.0 mmHg   pO2, Arterial 99.0  80.0 - 100.0 mmHg   Bicarbonate 23.5  20.0 - 24.0 mEq/L   TCO2 25  0 - 100 mmol/L   O2 Saturation 97.0     Acid-base deficit 2.0  0.0 - 2.0 mmol/L   Patient temperature 37.5 C     Collection site RADIAL, ALLEN'S TEST ACCEPTABLE     Drawn by Nurse     Sample type ARTERIAL    GLUCOSE, CAPILLARY     Status: Abnormal   Collection Time    05/20/14  5:22 PM      Result Value Ref Range   Glucose-Capillary 114 (*) 70 - 99 mg/dL  GLUCOSE, CAPILLARY     Status: None   Collection Time    05/20/14  6:18 PM      Result Value Ref Range   Glucose-Capillary 98  70 - 99 mg/dL  CBC     Status: Abnormal   Collection Time    05/20/14  6:40 PM      Result Value Ref Range   WBC 18.4 (*) 4.0 - 10.5 K/uL   RBC 3.62 (*) 4.22 - 5.81 MIL/uL   Hemoglobin 10.1 (*) 13.0 - 17.0 g/dL   HCT 28.9 (*) 39.0 - 52.0 %   MCV 79.8  78.0 - 100.0 fL   MCH 27.9  26.0 - 34.0 pg   MCHC 34.9  30.0 - 36.0 g/dL   RDW 13.8  11.5 - 15.5 %   Platelets 175  150 - 400 K/uL  MAGNESIUM     Status: Abnormal   Collection Time    05/20/14  6:40 PM      Result Value Ref Range   Magnesium 3.1 (*) 1.5 - 2.5 mg/dL  CREATININE, SERUM     Status: None   Collection Time    05/20/14  6:40 PM      Result Value Ref Range   Creatinine, Ser 0.78  0.50 - 1.35 mg/dL   GFR calc non Af Amer >90  >90 mL/min   GFR calc Af Amer >90  >90 mL/min   Comment: (NOTE)     The  eGFR has been calculated using the CKD EPI equation.     This calculation has not been validated in all clinical situations.     eGFR's persistently <90 mL/min signify possible Chronic Kidney     Disease.  POCT I-STAT, CHEM 8     Status: Abnormal   Collection Time    05/20/14  6:41 PM      Result Value Ref  Range   Sodium 143  137 - 147 mEq/L   Potassium 4.2  3.7 - 5.3 mEq/L   Chloride 108  96 - 112 mEq/L   BUN 5 (*) 6 - 23 mg/dL   Creatinine, Ser 0.80  0.50 - 1.35 mg/dL   Glucose, Bld 112 (*) 70 - 99 mg/dL   Calcium, Ion 1.17  1.12 - 1.23 mmol/L   TCO2 23  0 - 100 mmol/L   Hemoglobin 9.5 (*) 13.0 - 17.0 g/dL   HCT 28.0 (*) 39.0 - 52.0 %  GLUCOSE, CAPILLARY     Status: None   Collection Time    05/20/14  7:23 PM      Result Value Ref Range   Glucose-Capillary 98  70 - 99 mg/dL   Comment 1 Documented in Chart     Comment 2 Notify RN    GLUCOSE, CAPILLARY     Status: Abnormal   Collection Time    05/20/14 11:46 PM      Result Value Ref Range   Glucose-Capillary 108 (*) 70 - 99 mg/dL   Comment 1 Documented in Chart     Comment 2 Notify RN    GLUCOSE, CAPILLARY     Status: Abnormal   Collection Time    05/21/14  3:41 AM      Result Value Ref Range   Glucose-Capillary 110 (*) 70 - 99 mg/dL   Comment 1 Documented in Chart     Comment 2 Notify RN    CBC     Status: Abnormal   Collection Time    05/21/14  4:15 AM      Result Value Ref Range   WBC 13.5 (*) 4.0 - 10.5 K/uL   RBC 3.55 (*) 4.22 - 5.81 MIL/uL   Hemoglobin 10.0 (*) 13.0 - 17.0 g/dL   HCT 28.6 (*) 39.0 - 52.0 %   MCV 80.6  78.0 - 100.0 fL   MCH 28.2  26.0 - 34.0 pg   MCHC 35.0  30.0 - 36.0 g/dL   RDW 13.8  11.5 - 15.5 %   Platelets 153  150 - 400 K/uL  BASIC METABOLIC PANEL     Status: Abnormal   Collection Time    05/21/14  4:15 AM      Result Value Ref Range   Sodium 138  137 - 147 mEq/L   Potassium 4.1  3.7 - 5.3 mEq/L   Chloride 104  96 - 112 mEq/L   CO2 20  19 - 32 mEq/L   Glucose, Bld 124 (*) 70 - 99 mg/dL   BUN 7  6 - 23 mg/dL   Creatinine, Ser 0.73  0.50 - 1.35 mg/dL   Calcium 8.0 (*) 8.4 - 10.5 mg/dL   GFR calc non Af Amer >90  >90 mL/min   GFR calc Af Amer >90  >90 mL/min   Comment: (NOTE)     The eGFR has been calculated using the CKD EPI equation.     This calculation has not been validated in  all clinical situations.  eGFR's persistently <90 mL/min signify possible Chronic Kidney     Disease.  MAGNESIUM     Status: None   Collection Time    05/21/14  4:15 AM      Result Value Ref Range   Magnesium 2.5  1.5 - 2.5 mg/dL  POCT I-STAT 3, ART BLOOD GAS (G3+)     Status: None   Collection Time    05/21/14  4:18 AM      Result Value Ref Range   pH, Arterial 7.388  7.350 - 7.450   pCO2 arterial 36.8  35.0 - 45.0 mmHg   pO2, Arterial 93.0  80.0 - 100.0 mmHg   Bicarbonate 22.0  20.0 - 24.0 mEq/L   TCO2 23  0 - 100 mmol/L   O2 Saturation 97.0     Acid-base deficit 2.0  0.0 - 2.0 mmol/L   Patient temperature 99.6 F     Collection site ARTERIAL LINE     Drawn by Nurse     Sample type ARTERIAL    GLUCOSE, CAPILLARY     Status: Abnormal   Collection Time    05/21/14  7:30 AM      Result Value Ref Range   Glucose-Capillary 121 (*) 70 - 99 mg/dL   Comment 1 Notify RN    GLUCOSE, CAPILLARY     Status: Abnormal   Collection Time    05/21/14 12:30 PM      Result Value Ref Range   Glucose-Capillary 112 (*) 70 - 99 mg/dL   Comment 1 Notify RN    GLUCOSE, CAPILLARY     Status: Abnormal   Collection Time    05/21/14  4:40 PM      Result Value Ref Range   Glucose-Capillary 135 (*) 70 - 99 mg/dL   Comment 1 Notify RN     Comment 2 Documented in Chart    MAGNESIUM     Status: None   Collection Time    05/21/14  5:00 PM      Result Value Ref Range   Magnesium 2.2  1.5 - 2.5 mg/dL  CBC     Status: Abnormal   Collection Time    05/21/14  5:00 PM      Result Value Ref Range   WBC 22.2 (*) 4.0 - 10.5 K/uL   RBC 4.07 (*) 4.22 - 5.81 MIL/uL   Hemoglobin 11.3 (*) 13.0 - 17.0 g/dL   HCT 33.2 (*) 39.0 - 52.0 %   MCV 81.6  78.0 - 100.0 fL   MCH 27.8  26.0 - 34.0 pg   MCHC 34.0  30.0 - 36.0 g/dL   RDW 14.3  11.5 - 15.5 %   Platelets 219  150 - 400 K/uL   Comment: REPEATED TO VERIFY  CREATININE, SERUM     Status: None   Collection Time    05/21/14  5:00 PM      Result Value Ref  Range   Creatinine, Ser 0.87  0.50 - 1.35 mg/dL   GFR calc non Af Amer >90  >90 mL/min   GFR calc Af Amer >90  >90 mL/min   Comment: (NOTE)     The eGFR has been calculated using the CKD EPI equation.     This calculation has not been validated in all clinical situations.     eGFR's persistently <90 mL/min signify possible Chronic Kidney     Disease.  GLUCOSE, CAPILLARY     Status: Abnormal   Collection Time  05/21/14  8:39 PM      Result Value Ref Range   Glucose-Capillary 111 (*) 70 - 99 mg/dL  GLUCOSE, CAPILLARY     Status: Abnormal   Collection Time    05/22/14 12:15 AM      Result Value Ref Range   Glucose-Capillary 131 (*) 70 - 99 mg/dL  CBC     Status: Abnormal   Collection Time    05/22/14  3:20 AM      Result Value Ref Range   WBC 21.4 (*) 4.0 - 10.5 K/uL   RBC 3.80 (*) 4.22 - 5.81 MIL/uL   Hemoglobin 10.5 (*) 13.0 - 17.0 g/dL   HCT 31.0 (*) 39.0 - 52.0 %   MCV 81.6  78.0 - 100.0 fL   MCH 27.6  26.0 - 34.0 pg   MCHC 33.9  30.0 - 36.0 g/dL   RDW 13.9  11.5 - 15.5 %   Platelets 221  150 - 400 K/uL  BASIC METABOLIC PANEL     Status: Abnormal   Collection Time    05/22/14  3:20 AM      Result Value Ref Range   Sodium 136 (*) 137 - 147 mEq/L   Potassium 3.9  3.7 - 5.3 mEq/L   Chloride 99  96 - 112 mEq/L   CO2 24  19 - 32 mEq/L   Glucose, Bld 127 (*) 70 - 99 mg/dL   BUN 11  6 - 23 mg/dL   Creatinine, Ser 0.79  0.50 - 1.35 mg/dL   Calcium 8.3 (*) 8.4 - 10.5 mg/dL   GFR calc non Af Amer >90  >90 mL/min   GFR calc Af Amer >90  >90 mL/min   Comment: (NOTE)     The eGFR has been calculated using the CKD EPI equation.     This calculation has not been validated in all clinical situations.     eGFR's persistently <90 mL/min signify possible Chronic Kidney     Disease.    Imaging: Imaging results have been reviewed  Assessment/Plan:   1. Principal Problem: 2.   S/P CABG x 4 3. Active Problems: 4.   Essential hypertension, benign 5.   NSTEMI (non-ST elevated  myocardial infarction) 6.   Accelerated essential hypertension 7.   Abdominal aortic atherosclerosis 8.   Coronary artery disease 9.   Time Spent Directly with Patient:  20 minutes  Length of Stay:  LOS: 5 days   POD #2 CABG X4 in setting of NSTEMI. NSR. Looks great. VSS. Exam benign. CRH. Nl progression per TCTS. F/U with Dr. Ellyn Hack as OP after D/C. Will see again as needed.   Lorretta Harp 05/22/2014, 10:05 AM

## 2014-05-22 NOTE — Progress Notes (Signed)
CARDIAC REHAB PHASE I   PRE:  Rate/Rhythm: 75 SR  BP:  Supine: 124/80  Sitting:   Standing:    SaO2: 94%RA  MODE:  Ambulation: 350 ft   POST:  Rate/Rhythm: 78 SR  BP:  Supine:   Sitting: 103/70  Standing:    SaO2: 98%RA 1045-1135 Nephew and pt signed interpretation sheet and put on chart. Education completed emphasizing sternal precautions, diet , exercise, IS, and wound care. Pt did not want rolling walker for home use. Declined CRP 2. Wants to walk for ex on his own. Since HGA1C 5.7 did encourage watching carbs and sweets. Pt has been trying to eat less salt per nephew. Walked 350 ft on RA with rolling walker and asst x 1. To recliner after walk.   Luetta Nutting, RN BSN  05/22/2014 11:35 AM

## 2014-05-22 NOTE — Progress Notes (Signed)
Pt ambulated 400 ft with rolling walker. He tolerated well, without sob or difficulty. Pt has multiple family members and friends visiting. Communicating through visitors. Pt understands some English.

## 2014-05-22 NOTE — Progress Notes (Addendum)
Pt ambulated with front wheel walker; pt tolerated walk well; distance ambulated was 350 ft.  Park Breed, RN

## 2014-05-22 NOTE — Progress Notes (Signed)
DC EPW per MD orders and protocol; wires intact; bedrest for one hour; Q15 vitals for one hour; family at bedside; call bell within reach; will continue to monitor.  Park Breed, RN

## 2014-05-23 LAB — GLUCOSE, CAPILLARY: Glucose-Capillary: 106 mg/dL — ABNORMAL HIGH (ref 70–99)

## 2014-05-23 NOTE — Progress Notes (Signed)
Pt ambulated 400 ft in hall with rolling walker. He tolerated the walk well, without sob or complaints.

## 2014-05-23 NOTE — Progress Notes (Signed)
Asked patient if he wanted to walk a second time and he refused. States that he wants to walk later. Will continue to monitor closely. Lajuana Matte, RN

## 2014-05-23 NOTE — Progress Notes (Addendum)
      [address]       [place] [address]             [phone]      3 Days Post-Op Procedure(s) (LRB): CORONARY ARTERY BYPASS GRAFTING (CABG) x 4,  LIMA-MID LAD,  SVG-DIAGONAL,  SEQUENTIAL SVG -PDA, PLB #2 (N/A) INTRAOPERATIVE TRANSESOPHAGEAL ECHOCARDIOGRAM (N/A)  Subjective:  No new complaints this morning.  Objective: Vital signs in last 24 hours: Temp:  [97.9 F (36.6 C)-98.7 F (37.1 C)] 98.7 F (37.1 C) (05/24 0552) Pulse Rate:  [56-91] 91 (05/24 0552) Cardiac Rhythm:  [-] Normal sinus rhythm (05/23 2045) Resp:  [18-19] 18 (05/24 0552) BP: (104-146)/(58-99) 125/75 mmHg (05/24 0552) SpO2:  [91 %-97 %] 91 % (05/24 0552) Weight:  [172 lb 14.4 oz (78.427 kg)] 172 lb 14.4 oz (78.427 kg) (05/24 0552)  Intake/Output from previous day: 05/23 0701 - 05/24 0700 In: 960 [P.O.:960] Out: 1350 [Urine:1350]  General appearance: alert, cooperative and no distress Heart: regular rate and rhythm Lungs: clear to auscultation bilaterally Abdomen: soft, non-tender; bowel sounds normal; no masses,  no organomegaly Extremities: trace edema Wound: clean and dry  Lab Results:  Recent Labs  05/21/14 1700 05/22/14 0320  WBC 22.2* 21.4*  HGB 11.3* 10.5*  HCT 33.2* 31.0*  PLT 219 221   BMET:  Recent Labs  05/21/14 0415 05/21/14 1700 05/22/14 0320  NA 138  --  136*  K 4.1  --  3.9  CL 104  --  99  CO2 20  --  24  GLUCOSE 124*  --  127*  BUN 7  --  11  CREATININE 0.73 0.87 0.79  CALCIUM 8.0*  --  8.3*    PT/INR:  Recent Labs  05/20/14 1300  LABPROT 18.5*  INR 1.59*   ABG    Component Value Date/Time   PHART 7.388 05/21/2014 0418   HCO3 22.0 05/21/2014 0418   TCO2 23 05/21/2014 0418   ACIDBASEDEF 2.0 05/21/2014 0418   O2SAT 97.0 05/21/2014 0418   CBG (last 3)   Recent Labs  05/22/14 1114 05/22/14 1636 05/22/14 2228  GLUCAP 132* 103* 106*    Assessment/Plan: S/P Procedure(s) (LRB): CORONARY ARTERY BYPASS GRAFTING (CABG) x 4,   LIMA-MID LAD,  SVG-DIAGONAL,  SEQUENTIAL SVG -PDA, PLB #2 (N/A) INTRAOPERATIVE TRANSESOPHAGEAL ECHOCARDIOGRAM (N/A)  1. CV- NSR, remains tachy, good control of blood pressure- continue Lopressor 2. Pulm- off oxygen, encouraged use of IS 3. Renal- remains hypervolemic, weight is trending down continue diuresis 4. Dispo- patient looks good, will repeat CBC in to ensure leukocytosis is resolving, no issues arise will plan to d/c in AM   LOS: 6 days    Lowella Dandy 05/23/2014  I have seen and examined the patient and agree with the assessment and plan as outlined.  [name] 05/23/2014 10:01 AM

## 2014-05-24 LAB — CBC
HEMATOCRIT: 32.4 % — AB (ref 39.0–52.0)
HEMOGLOBIN: 10.4 g/dL — AB (ref 13.0–17.0)
MCH: 26.1 pg (ref 26.0–34.0)
MCHC: 32.1 g/dL (ref 30.0–36.0)
MCV: 81.4 fL (ref 78.0–100.0)
Platelets: 287 10*3/uL (ref 150–400)
RBC: 3.98 MIL/uL — AB (ref 4.22–5.81)
RDW: 13.7 % (ref 11.5–15.5)
WBC: 13.6 10*3/uL — AB (ref 4.0–10.5)

## 2014-05-24 MED ORDER — LISINOPRIL 10 MG PO TABS
10.0000 mg | ORAL_TABLET | Freq: Every day | ORAL | Status: DC
  Administered 2014-05-24: 10 mg via ORAL
  Filled 2014-05-24: qty 1

## 2014-05-24 MED ORDER — LISINOPRIL 10 MG PO TABS: 10.0000 mg | ORAL_TABLET | Freq: Every day | ORAL | Status: DC

## 2014-05-24 MED ORDER — METOPROLOL TARTRATE 25 MG PO TABS: 25.0000 mg | ORAL_TABLET | Freq: Two times a day (BID) | ORAL | Status: DC

## 2014-05-24 MED ORDER — OXYCODONE HCL 5 MG PO TABS: 5.0000 mg | ORAL_TABLET | ORAL | Status: DC | PRN

## 2014-05-24 MED ORDER — ATORVASTATIN CALCIUM 20 MG PO TABS: 20.0000 mg | ORAL_TABLET | Freq: Every day | ORAL | Status: DC

## 2014-05-24 NOTE — Progress Notes (Signed)
Removed CT sutures per MD order per hospital policy. Applied benzoin and steri strips to site. Patient tolerated well. Will continue to monitor. Emira Eubanks, RN 

## 2014-05-24 NOTE — Progress Notes (Addendum)
      [address]       [place] [address]             [phone]      4 Days Post-Op Procedure(s) (LRB): CORONARY ARTERY BYPASS GRAFTING (CABG) x 4,  LIMA-MID LAD,  SVG-DIAGONAL,  SEQUENTIAL SVG -PDA, PLB #2 (N/A) INTRAOPERATIVE TRANSESOPHAGEAL ECHOCARDIOGRAM (N/A)  Subjective:  Mr. Braswell feels good.  He has no complaints this morning.  He is ambulating without difficulty.  + BM  Objective: Vital signs in last 24 hours: Temp:  [98.2 F (36.8 C)-98.8 F (37.1 C)] 98.8 F (37.1 C) (05/25 0546) Pulse Rate:  [90-95] 95 (05/25 0546) Cardiac Rhythm:  [-] Normal sinus rhythm (05/24 2035) Resp:  [18] 18 (05/25 0546) BP: (117-139)/(74-84) 130/77 mmHg (05/25 0546) SpO2:  [97 %-98 %] 98 % (05/25 0546) Weight:  [170 lb 3.1 oz (77.2 kg)] 170 lb 3.1 oz (77.2 kg) (05/25 0546)  Intake/Output from previous day: 05/24 0701 - 05/25 0700 In: 410 [P.O.:410] Out: 900 [Urine:900]  General appearance: alert, cooperative and no distress Heart: regular rate and rhythm Lungs: clear to auscultation bilaterally Abdomen: soft, non-tender; bowel sounds normal; no masses,  no organomegaly Extremities: edema trace Wound: clean and dry  Lab Results:  Recent Labs  05/22/14 0320 05/24/14 0350  WBC 21.4* 13.6*  HGB 10.5* 10.4*  HCT 31.0* 32.4*  PLT 221 287   BMET:  Recent Labs  05/21/14 1700 05/22/14 0320  NA  --  136*  K  --  3.9  CL  --  99  CO2  --  24  GLUCOSE  --  127*  BUN  --  11  CREATININE 0.87 0.79  CALCIUM  --  8.3*    PT/INR: No results found for this basename: LABPROT, INR,  in the last 72 hours ABG    Component Value Date/Time   PHART 7.388 05/21/2014 0418   HCO3 22.0 05/21/2014 0418   TCO2 23 05/21/2014 0418   ACIDBASEDEF 2.0 05/21/2014 0418   O2SAT 97.0 05/21/2014 0418   CBG (last 3)   Recent Labs  05/22/14 1114 05/22/14 1636 05/22/14 2228  GLUCAP 132* 103* 106*    Assessment/Plan: S/P Procedure(s) (LRB): CORONARY ARTERY BYPASS GRAFTING  (CABG) x 4,  LIMA-MID LAD,  SVG-DIAGONAL,  SEQUENTIAL SVG -PDA, PLB #2 (N/A) INTRAOPERATIVE TRANSESOPHAGEAL ECHOCARDIOGRAM (N/A)  1. CV- NSR, blood pressure mildly elevated- continue Lopressor, Lisinopril 2. Pulm- no acute issues, continue IS 3. Renal- remains hypervolemic, weight trending down, not on Lasix 4. ID- Leukocytosis resolved, no fever present 5. Dispo- patient doing well, will d/c home today   LOS: 7 days    Lowella Dandy 05/24/2014  I have seen and examined the patient and agree with the assessment and plan as outlined.  [name] 05/24/2014 9:07 AM

## 2014-05-25 NOTE — Anesthesia Postprocedure Evaluation (Signed)
  Anesthesia Post-op Note  Patient: Roger Howe  Procedure(s) Performed: Procedure(s): CORONARY ARTERY BYPASS GRAFTING (CABG) x 4,  LIMA-MID LAD,  SVG-DIAGONAL,  SEQUENTIAL SVG -PDA, PLB #2 (N/A) INTRAOPERATIVE TRANSESOPHAGEAL ECHOCARDIOGRAM (N/A)  Patient discharged with no apparent anesthetic complications

## 2014-05-31 MED FILL — Sodium Bicarbonate IV Soln 8.4%: INTRAVENOUS | Qty: 50 | Status: AC

## 2014-05-31 MED FILL — Heparin Sodium (Porcine) Inj 1000 Unit/ML: INTRAMUSCULAR | Qty: 10 | Status: AC

## 2014-05-31 MED FILL — Electrolyte-R (PH 7.4) Solution: INTRAVENOUS | Qty: 4000 | Status: AC

## 2014-05-31 MED FILL — Sodium Chloride IV Soln 0.9%: INTRAVENOUS | Qty: 2000 | Status: AC

## 2014-05-31 MED FILL — Mannitol IV Soln 20%: INTRAVENOUS | Qty: 500 | Status: AC

## 2014-06-02 ENCOUNTER — Other Ambulatory Visit: Payer: Self-pay | Admitting: *Deleted

## 2014-06-02 DIAGNOSIS — G8918 Other acute postprocedural pain: Secondary | ICD-10-CM

## 2014-06-02 MED ORDER — OXYCODONE HCL 5 MG PO TABS: 5.0000 mg | ORAL_TABLET | ORAL | Status: DC | PRN

## 2014-06-03 ENCOUNTER — Telehealth: Payer: Self-pay | Admitting: Cardiology

## 2014-06-03 ENCOUNTER — Encounter: Payer: Self-pay | Admitting: Cardiology

## 2014-06-03 NOTE — Telephone Encounter (Signed)
Closed encounter °

## 2014-06-10 ENCOUNTER — Other Ambulatory Visit: Payer: Self-pay | Admitting: Cardiothoracic Surgery

## 2014-06-10 ENCOUNTER — Other Ambulatory Visit: Payer: Self-pay | Admitting: Thoracic Surgery (Cardiothoracic Vascular Surgery)

## 2014-06-10 DIAGNOSIS — I214 Non-ST elevation (NSTEMI) myocardial infarction: Secondary | ICD-10-CM

## 2014-06-14 ENCOUNTER — Ambulatory Visit (INDEPENDENT_AMBULATORY_CARE_PROVIDER_SITE_OTHER): Payer: Self-pay | Admitting: Thoracic Surgery (Cardiothoracic Vascular Surgery)

## 2014-06-14 ENCOUNTER — Encounter: Payer: Self-pay | Admitting: Thoracic Surgery (Cardiothoracic Vascular Surgery)

## 2014-06-14 ENCOUNTER — Ambulatory Visit (HOSPITAL_COMMUNITY)
Admission: RE | Admit: 2014-06-14 | Discharge: 2014-06-14 | Disposition: A | Discharge status: Home / Self Care | Payer: 59 | Source: Ambulatory Visit | Attending: Thoracic Surgery (Cardiothoracic Vascular Surgery) | Admitting: Thoracic Surgery (Cardiothoracic Vascular Surgery)

## 2014-06-14 VITALS — BP 128/89 | HR 88 | Resp 16 | Ht 67.0 in | Wt 164.0 lb

## 2014-06-14 DIAGNOSIS — I2510 Atherosclerotic heart disease of native coronary artery without angina pectoris: Secondary | ICD-10-CM

## 2014-06-14 DIAGNOSIS — J90 Pleural effusion, not elsewhere classified: Secondary | ICD-10-CM | POA: Insufficient documentation

## 2014-06-14 DIAGNOSIS — Z951 Presence of aortocoronary bypass graft: Secondary | ICD-10-CM

## 2014-06-14 DIAGNOSIS — I517 Cardiomegaly: Secondary | ICD-10-CM | POA: Insufficient documentation

## 2014-06-14 DIAGNOSIS — I214 Non-ST elevation (NSTEMI) myocardial infarction: Secondary | ICD-10-CM

## 2014-06-14 DIAGNOSIS — G8918 Other acute postprocedural pain: Secondary | ICD-10-CM

## 2014-06-14 MED ORDER — OXYCODONE HCL 5 MG PO TABS: 5.0000 mg | ORAL_TABLET | ORAL | Status: DC | PRN

## 2014-06-14 NOTE — Patient Instructions (Signed)
The patient should continue to avoid any heavy lifting or strenuous use of arms or shoulders for at least a total of three months from the time of surgery.  The patient is encouraged to enroll and participate in the outpatient cardiac rehab program beginning as soon as practical.

## 2014-06-14 NOTE — Progress Notes (Signed)
      [address]       [place] [address]             [phone]     CARDIOTHORACIC SURGERY OFFICE NOTE  Referring Provider is [name], MD PCP is No primary provider on file.   HPI:  Patient returns to the office today for routine followup status post coronary artery bypass grafting x4 on 05/20/2014 for severe three-vessel coronary artery disease status post acute non-ST segment elevation myocardial infarction.  His postoperative recovery in the hospital was entirely uncomplicated.  He returns to the office today with a family member and an interpreter. Since hospital discharge she has been staying locally with a friend. He reports that he has done fairly well. He still has soreness in his chest. This affects his ability to sleep at night. He denies any shortness of breath. His appetite is marginal. He is walking twice a day without any difficulty.   Current Outpatient Prescriptions  Medication Sig Dispense Refill  . aspirin 325 MG tablet Take 325 mg by mouth daily as needed (pain).      Marland Kitchen atorvastatin (LIPITOR) 20 MG tablet Take 1 tablet (20 mg total) by mouth daily at 6 PM.  30 tablet  3  . lisinopril (PRINIVIL,ZESTRIL) 10 MG tablet Take 1 tablet (10 mg total) by mouth daily.  30 tablet  3  . metoprolol tartrate (LOPRESSOR) 25 MG tablet Take 1 tablet (25 mg total) by mouth 2 (two) times daily.  60 tablet  3  . oxyCODONE (OXY IR/ROXICODONE) 5 MG immediate release tablet Take 1-2 tablets (5-10 mg total) by mouth every 4 (four) hours as needed for moderate pain.  40 tablet  0   No current facility-administered medications for this visit.      Physical Exam:   There were no vitals taken for this visit.  General:  Well-appearing  Chest:   Clear to auscultation  CV:   Regular rate and rhythm  Incisions:  Clean and dry healing nicely, sternum is stable  Abdomen:  Soft and nontender  Extremities:  Warm and well-perfused  Diagnostic Tests:  CHEST 2  VIEW  COMPARISON: 05/22/2014 and prior chest radiographs  FINDINGS:  Cardiomegaly and CABG changes again noted.  A small left pleural effusion is again noted.  There is no evidence of focal airspace disease, pulmonary edema,  suspicious pulmonary nodule/mass, right pleural effusion, or  pneumothorax. No acute bony abnormalities are identified.  IMPRESSION:  Cardiomegaly, CABG changes and small stable left pleural effusion.  No other significant change or abnormality identified.  Electronically Signed  By: Laveda Abbe M.D.  On: 06/14/2014 12:49    Impression:  Patient is progressing well 4 weeks status post coronary artery bypass grafting x4.  Plan:  I've encouraged patient to continue to gradually increase his physical activity as tolerated with his primary limitation remaining that he refrain from heavy lifting or strenuous use of his arms or shoulders for at least another 2 months. I think that by mid July he could go back to work with some physical restrictions, but it will be 3 months from the time of surgery before he may resume unrestricted activity.  We have not made any changes in his current medications although we have given him a refill prescription for oxycodone. All of his questions have been addressed.   Salvatore Decent. [name], MD 06/14/2014 1:03 PM

## 2014-06-16 ENCOUNTER — Ambulatory Visit (INDEPENDENT_AMBULATORY_CARE_PROVIDER_SITE_OTHER): Payer: 59 | Admitting: Cardiology

## 2014-06-16 VITALS — BP 112/72 | HR 83 | Ht 67.0 in | Wt 164.2 lb

## 2014-06-16 DIAGNOSIS — K5909 Other constipation: Secondary | ICD-10-CM

## 2014-06-16 DIAGNOSIS — I214 Non-ST elevation (NSTEMI) myocardial infarction: Secondary | ICD-10-CM

## 2014-06-16 DIAGNOSIS — I10 Essential (primary) hypertension: Secondary | ICD-10-CM

## 2014-06-16 DIAGNOSIS — I700 Atherosclerosis of aorta: Secondary | ICD-10-CM

## 2014-06-16 DIAGNOSIS — Z79899 Other long term (current) drug therapy: Secondary | ICD-10-CM

## 2014-06-16 DIAGNOSIS — E785 Hyperlipidemia, unspecified: Secondary | ICD-10-CM

## 2014-06-16 DIAGNOSIS — T50904A Poisoning by unspecified drugs, medicaments and biological substances, undetermined, initial encounter: Secondary | ICD-10-CM

## 2014-06-16 DIAGNOSIS — Z951 Presence of aortocoronary bypass graft: Secondary | ICD-10-CM

## 2014-06-16 DIAGNOSIS — I2510 Atherosclerotic heart disease of native coronary artery without angina pectoris: Secondary | ICD-10-CM

## 2014-06-16 DIAGNOSIS — K5903 Drug induced constipation: Secondary | ICD-10-CM

## 2014-06-16 NOTE — Patient Instructions (Addendum)
LAB WORK IN 3 MONTHS -- WILL MAIL IN SEPT 2015 YOUR LAB SLIP TO TAKE TO THE LAB TO HAVE LAB WORK    TRY TO LIMIT PAIN MEDICATION- OXYCODONE IT MY CAUSE CONSTIPATION.  MAY TRY TAKING PRUNE JUICE, OR DUCOLOAX  OR MIRALAX FOLLOW THE INSTRUCTION.  TRY TOSTAY WELL HYDRATE ( DRINK FLUIDS) IF NOT HUNGRY--TRY DRINKING A SUPPLEMENT LIKE BOOST OR ENSURE.   Your physician has requested that you have an echocardiogram. Echocardiography is a painless test that uses sound waves to create images of your heart. It provides your doctor with information about the size and shape of your heart and how well your heart's chambers and valves are working. This procedure takes approximately one hour. There are no restrictions for this procedure. PLEASE DO SEPT 2015   To Bn, Ng??i L?n (Constipation, Adult) To bn l khi m?t ng??i ?i ngoi t h?n 3 l?n m?t tu?n; g?p kh kh?n trong khi ?i ngoi ho?c c phn kh, c?ng ho?c l?n h?n bnh th??ng. Khi chng ta gi ?i, to bn ph? bi?n h?n. N?u b?n tm cch ch?a to bn b?ng thu?c gip b?n ?i ngoi (thu?c nhu?n trng), v?n ?? c th? tr? nn t?i t? h?n. S? d?ng thu?c nhu?n trng lu di c th? khi?n cho cc c? ru?t gi tr? nn y?u ?i. M?t ch? ?? ?n t ch?t x?, khng u?ng ?? n??c v vi?c dng m?t s? lo?i thu?c nh?t ??nh c th? khi?n cho to bn t?i t? h?n. NGUYN NHN  M?t s? lo?i thu?c nh?t ??nh, ch?ng h?n nh? thu?c ch?ng tr?m c?m, thu?c gi?m ?au, b? sung ch?t s?t, thu?c khng axit v thu?c l?i ti?u.  M?t s? b?nh nh?t ??nh nh? ti?u ???ng, h?i ch?ng ru?t kch thch (IBS), b?nh tuy?n gip ho?c tr?m c?m.  Khng u?ng ?? n??c.  Khng ?n ?? th?c ph?m giu ch?t x?.  C?ng th?ng ho?c ?i l?i.  Thi?u ho?t ??ng th? ch?t ho?c t?p th? d?c.  Khng ?i v? sinh khi c nhu c?u ?i ngoi.  B? qua nhu c?u ?i ngoi.  S? d?ng thu?c nhu?n trng qu nhi?u. TRI?U CH?NG  ?i ngoi t h?n 3 l?n m?t tu?n.  R?n ?? ??i ti?n.  C phn c?ng, kh ho?c l?n h?n bnh th??ng.  C?m th?y ??y b?ng  ho?c ch??ng b?ng.  ?au ? vng b?ng d??i.  Khng c?m th?y nh? nhm sau khi ?i ngoi. CH?N ?ON Chuyn gia ch?m Roy s?c kh?e s? xem b?nh s? c?a b?n v khm th?c th?. Ki?m tra thm c th? ???c th?c hi?n v?i ch?ng to bn n?ng. M?t s? ki?m tra c th? bao g?m:  X-quang c th?t bari ?? khm tr?c trng, ??i trng v ?i khi c? ru?t non c?a b?n.  N?i soi ??i trng sigma ?? khm ??i trng pha d??i c?a b?n.  N?i soi ??i trng ?? khm ton b? ??i trng c?a b?n. ?I?U TR? ?i?u tr? s? ph? thu?c vo m?c ?? nghim tr?ng c?a to bn v nh?ng g n gy ra. M?t s? ph??ng php ?i?u tr? b?ng ?n king bao g?m u?ng thm nhi?u n??c h?n v ?n nhi?u th?c ph?m giu ch?t x? h?n. Ph??ng php ?i?u tr? theo l?i s?ng c th? bao g?m t?p th? d?c th??ng xuyn. N?u cc ?? ngh? v? ch? ?? ?n u?ng v l?i s?ng ny khng c tc d?ng, chuyn gia ch?m [place] s?c kh?e c?a b?n c th? khuyn b?n nn dng thu?c nhu?n trng khng c?n k toa ??  gip b?n ?i ngoi. Thu?c c?n k toa c th? ???c k ??n n?u thu?c khng c?n k toa khng c tc d?ng. H??NG D?N CH?M [place] T?I NH  T?ng ch?t x? ?n king trong ch? ?? ?n u?ng c?a b?n, ch?ng h?n nh? tri cy, rau, ng? c?c v ??u. H?n ch? cc lo?i th?c ph?m c hm l??ng ch?t bo cao v ???ng ? qua x? l trong ch? ?? ?n u?ng c?a b?n, ch?ng h?n nh? khoai ty chin, hamburger, bnh quy, k?o v soda.  M?t ch?t b? sung ch?t x? c th? ???c thm vo ch? ?? ?n u?ng c?a b?n n?u b?n khng th? nh?n ?? ch?t x? t? cc lo?i th?c ph?m.  U?ng ?? n??c ?? gi? cho n??c ti?u trong ho?c vng nh?t.  T?p th? d?c th??ng xuyn ho?c theo ch? d?n c?a chuyn gia ch?m [place] s?c kh?e.  ?i v? sinh khi b?n c nhu c?u. Khng c? nh?n.  Ch? s? d?ng thu?c theo ch? d?n c?a chuyn gia ch?m [place] s?c kh?e. Khng dng cc lo?i thu?c khc ?? tr? to bn m khng ni chuy?n tr??c v?i chuyn gia ch?m [place] s?c kh?e. HY NGAY L?P T?C ?I KHM N?U:  Phn c?a b?n c mu ?? t??i.  To bn ko di h?n 4 ngy ho?c b?n b? n?ng h?n.  B?n b? ?au b?ng  ho?c ?au tr?c trng.  Phn c?a b?n m?ng, gi?ng nh? bt ch.  B?n b? gi?m cn khng gi?i thch ???c. ??M B?O B?N:  Hi?u cc h??ng d?n ny.  S? theo di tnh tr?ng c?a mnh.  S? yu c?u tr? gip ngay l?p t?c n?u b?n c?m th?y khng ?? ho?c tnh tr?ng tr?m tr?ng h?n. Document Released: 04/03/2011 Document Revised: 08/19/2013 ExitCare Patient Information 2014 [facility] [employer] [facility]   Your physician wants you to follow-up in 4 MONTHS DR HARDING. 30 min appointment  You will receive a reminder letter in the mail two months in advance. If you don't receive a letter, please call our office to schedule the follow-up appointment.

## 2014-06-18 ENCOUNTER — Encounter: Payer: Self-pay | Admitting: Cardiology

## 2014-06-18 DIAGNOSIS — E785 Hyperlipidemia, unspecified: Secondary | ICD-10-CM | POA: Insufficient documentation

## 2014-06-18 DIAGNOSIS — K5903 Drug induced constipation: Secondary | ICD-10-CM | POA: Insufficient documentation

## 2014-06-18 NOTE — Assessment & Plan Note (Signed)
On presentation, he actually did have accelerated hypertension, that is now well controlled.

## 2014-06-18 NOTE — Assessment & Plan Note (Signed)
We talked about the importance of using stool softeners, we can use either over-the-counter medicines like MiraLax or Maalox. Also since then been reluctant to use medications unless he has to I recommended prune juice and hydration. Also that he should back off on the use of a narcotic agent.

## 2014-06-18 NOTE — Assessment & Plan Note (Signed)
Essentially multivessel disease involving circumflex, ramus, LAD and RPDA. He did well post CABG, discharged rapidly with no arrhythmias, CHF or angina. He is now on beta blocker, ACE inhibitor and statin as well as aspirin.

## 2014-06-18 NOTE — Progress Notes (Signed)
Roger Howe: Roger Howe MRN: [id number]  DOB: 24-Jun-1956   DOV:06/18/2014 PCP: No primary provider on file.  Clinic Note: Chief Complaint  Patient presents with  . Follow-up    post hospital, CABG x4, pt of chest pain, SOB.    HPI: Roger Howe is a 58 y.o.  male with a PMH below who presents today for initial cardiology followup status post CABG x4 (05/20/2012) following a non-STEMI.  He presented on May 18 [facility] with burning substernal chest pain radiating to his back that began a week prior to admission. It increased in severity as did his dyspnea. Prior to this evaluation he only had a diagnosis of hypertension. He saw Dr. Cornelius Moraswen  in on the 15th and is seeing me now. Since discharge, he was staying with a friend locally. He's been doing relatively well and is back to exercising, still has significant chest soreness he also notes constipation. --  He asks about returning to work.  Dr. Cornelius Moraswen felt that he could return to work in mid July, but with limitations -- refraining from heavy lifting or strenuous exercise.  Full activity by ~3 months post-op.  Interval History: For cardiac standpoint he is doing relatively well. He has expected postoperative chest soreness but no dyspnea or chest tightness/pressure that is consistent with his angina symptoms. He is now walking at least a mile a day. He denies any PND orthopnea or edema. No rapid or irregular heartbeats, syncope/near significant medications or amaurosis fugax. No melena, hematochezia, hematuria, epistaxis. No orthostatic symptoms. He is gradually starting to build back his energy. He is still taking relatively frequent doses of oxycodone, and is noting pretty significant constipation. He has been using the incentive spirometry, but notes discomfort with deep inspiration.  Past Medical History  Diagnosis Date  . Essential hypertension   . NSTEMI (non-ST elevated myocardial infarction) 05/17/2014  . CAD, multiple vessel  05/18/2014  . 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 Abdominal aortic atherosclerosis     Seen on catheterization    Prior Cardiac Evaluation and Past Surgical History: Past Surgical History  Procedure Laterality Date  . Cardiac catheterization  05/17/2014    DR HARDING: 95% proximal LAD, 90% ramus intermedius, 95% mid circumflex after OM1, neither percent RPDA  . Coronary artery bypass graft N/A 05/20/2014    Procedure: CORONARY ARTERY BYPASS GRAFTING (CABG) x 4,  LIMA-MID LAD,  SVG-DIAGONAL,  SEQUENTIAL SVG -PDA, PLB #2;  Surgeon: Purcell Nailslarence H Owen, MD;  Location: [facility] OR;  Service: Open Heart Surgery;  Laterality: N/A;  . Intraoperative transesophageal echocardiogram N/A 05/20/2014    Procedure: INTRAOPERATIVE TRANSESOPHAGEAL ECHOCARDIOGRAM;  Surgeon: Purcell Nailslarence H Owen, MD;  [facility];  Service: Open Heart Surgery;  Laterality: N/A;    No Known Allergies  Current Outpatient Prescriptions  Medication Sig Dispense Refill  . aspirin 325 MG tablet Take 325 mg by mouth daily as needed (pain).      Marland Kitchen. atorvastatin (LIPITOR) 20 MG tablet Take 1 tablet (20 mg total) by mouth daily at 6 PM.  30 tablet  3  . lisinopril (PRINIVIL,ZESTRIL) 10 MG tablet Take 1 tablet (10 mg total) by mouth daily.  30 tablet  3  . metoprolol tartrate (LOPRESSOR) 25 MG tablet Take 1 tablet (25 mg total) by mouth 2 (two) times daily.  60 tablet  3  . oxyCODONE (OXY  IR/ROXICODONE) 5 MG immediate release tablet Take 1-2 tablets (5-10 mg total) by mouth every 4 (four) hours as needed for moderate pain.  40 tablet  0   No current facility-administered medications for this visit.    History   Social History Narrative   [place] - needs an interpreter. Married with 5 children and 4 grandchildren.   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    Never smoked.   ROS: A comprehensive Review  of Systems - Negative except symptoms noted in HPI -- notably, chest wall pain & constipation.  PHYSICAL EXAM BP 112/72  Pulse 83  Ht 5\' 7"  (1.702 m)  Wt 164 lb 3.2 oz (74.481 kg)  BMI 25.71 kg/m2 General appearance: alert, cooperative, appears stated age, no distress and healthy appearing  Neck: no adenopathy, no carotid bruit, no JVD, supple, symmetrical, trachea midline and thyroid not enlarged, symmetric, no tenderness/mass/nodules Lungs: clear to auscultation bilaterally, normal percussion bilaterally and Nonlabored, good air movement Heart: RRR, normal S1-S2. No M./R./G. Nondisplaced PMI. Normal chest wall tightness Abdomen: Soft, mildly tender. Somewhat diminished bowel sounds. No HSM Extremities: extremities normal, atraumatic, no cyanosis or edema Pulses: 2+ and symmetric Neurologic: Alert and oriented X 3, normal strength and tone. Normal symmetric reflexes. Normal coordination and gait   Adult ECG Report  Rate: 83 ;  Rhythm: normal sinus rhythm; major septal ST and T wave inversions, cannot rule out ischemia. Suspect this is some evolutionary changes. No change since postop   Recent Labs:  Lipids checked in the hospital. Total cholesterol 184, HDL 27, LDL 125 and triglycerides 675.  ASSESSMENT / PLAN: Coronary artery disease, occlusive Essentially multivessel disease involving circumflex, ramus, LAD and RPDA. He did well post CABG, discharged rapidly with no arrhythmias, CHF or angina. He is now on beta blocker, ACE inhibitor and statin as well as aspirin.  NSTEMI (non-ST elevated myocardial infarction) No further angina symptoms. No residual effect of ischemic cardiomyopathy.  His EF was mild moderately reduced myelin patient. We should repeat check an echo prior to him being seen back in followup in roughly 3 months. This week to determine how much improvement in EF he has achieved.  Essential hypertension On presentation, he actually did have accelerated hypertension,  that is now well controlled.  Abdominal aortic atherosclerosis On statin and other CRF reducing agents; this was seen on cardiac catheterization as well as CT angiography in the hospital.  S/P CABG x 4 Still having quite a bit of postoperative chest wall pain. Think that's really been taking narcotics which has been causing constipation.  Dyslipidemia, goal LDL below 70 Currently on statin. His LDL preoperatively was 125 when not on a statin. Hopefully improve by his followup visit.   Drug-induced constipation We talked about the importance of using stool softeners, we can use either over-the-counter medicines like MiraLax or Maalox. Also since then been reluctant to use medications unless he has to I recommended prune juice and hydration. Also that he should back off on the use of a narcotic agent.    Orders Placed This Encounter  Procedures  . Lipid panel  . Comprehensive metabolic panel  . EKG 12-Lead  . 2D Echocardiogram without contrast   No orders of the defined types were placed in this encounter.    Followup:  3-4 months  DAVID W. [name], M.D., M.S. Interventional Cardiology [facility]

## 2014-06-18 NOTE — Assessment & Plan Note (Signed)
Still having quite a bit of postoperative chest wall pain. Think that's really been taking narcotics which has been causing constipation.

## 2014-06-18 NOTE — Assessment & Plan Note (Signed)
No further angina symptoms. No residual effect of ischemic cardiomyopathy.  His EF was mild moderately reduced myelin patient. We should repeat check an echo prior to him being seen back in followup in roughly 3 months. This week to determine how much improvement in EF he has achieved.

## 2014-06-18 NOTE — Assessment & Plan Note (Signed)
Currently on statin. His LDL preoperatively was 125 when not on a statin. Hopefully improve by his followup visit.

## 2014-06-18 NOTE — Assessment & Plan Note (Addendum)
On statin and other CRF reducing agents; this was seen on cardiac catheterization as well as CT angiography in the hospital.

## 2014-06-25 NOTE — OR Nursing (Signed)
Addendum to scope page and wound class.  

## 2014-08-13 ENCOUNTER — Telehealth: Payer: Self-pay | Admitting: *Deleted

## 2014-08-13 DIAGNOSIS — Z79899 Other long term (current) drug therapy: Secondary | ICD-10-CM

## 2014-08-13 DIAGNOSIS — E785 Hyperlipidemia, unspecified: Secondary | ICD-10-CM

## 2014-08-13 NOTE — Telephone Encounter (Signed)
Mailed letter and labslip CMP, LIPID

## 2014-08-13 NOTE — Telephone Encounter (Signed)
Message copied by Tobin Chad on Fri Aug 13, 2014 12:27 PM ------      Message from: Tobin Chad      Created: Wed Jun 16, 2014  4:59 PM       LABS IN SEPT 2015---CMP LIPID      MAIL IN AUG 2015 ------

## 2014-08-25 ENCOUNTER — Telehealth: Payer: Self-pay | Admitting: Cardiology

## 2014-08-25 LAB — COMPREHENSIVE METABOLIC PANEL
ALK PHOS: 60 U/L (ref 39–117)
ALT: 16 U/L (ref 0–53)
AST: 22 U/L (ref 0–37)
Albumin: 4.4 g/dL (ref 3.5–5.2)
BILIRUBIN TOTAL: 0.8 mg/dL (ref 0.2–1.2)
BUN: 7 mg/dL (ref 6–23)
CO2: 28 mEq/L (ref 19–32)
CREATININE: 0.88 mg/dL (ref 0.50–1.35)
Calcium: 9.4 mg/dL (ref 8.4–10.5)
Chloride: 106 mEq/L (ref 96–112)
Glucose, Bld: 82 mg/dL (ref 70–99)
Potassium: 4 mEq/L (ref 3.5–5.3)
Sodium: 141 mEq/L (ref 135–145)
TOTAL PROTEIN: 7.9 g/dL (ref 6.0–8.3)

## 2014-08-25 LAB — LIPID PANEL
CHOL/HDL RATIO: 4.5 ratio
Cholesterol: 126 mg/dL (ref 0–200)
HDL: 28 mg/dL — ABNORMAL LOW (ref >39)
LDL Cholesterol: 72 mg/dL (ref 0–99)
Triglycerides: 130 mg/dL (ref <150)
VLDL: 26 mg/dL (ref 0–40)

## 2014-08-25 NOTE — Telephone Encounter (Signed)
Verlon Au wanted to know if labs(cmp,lipid) could be drawn today or wait until Sept as the letter stated that the patient received from the office. RN informed Farley Ly to draw labs now.

## 2014-09-02 ENCOUNTER — Encounter: Payer: Self-pay | Admitting: *Deleted

## 2014-09-14 ENCOUNTER — Ambulatory Visit (HOSPITAL_COMMUNITY)
Admission: RE | Admit: 2014-09-14 | Discharge: 2014-09-14 | Disposition: A | Discharge status: Home / Self Care | Payer: Medicaid Other | Source: Ambulatory Visit | Attending: Internal Medicine | Admitting: Internal Medicine

## 2014-09-14 ENCOUNTER — Encounter: Payer: Self-pay | Admitting: Cardiology

## 2014-09-14 DIAGNOSIS — I214 Non-ST elevation (NSTEMI) myocardial infarction: Secondary | ICD-10-CM

## 2014-09-14 DIAGNOSIS — I517 Cardiomegaly: Secondary | ICD-10-CM

## 2014-09-14 DIAGNOSIS — E785 Hyperlipidemia, unspecified: Secondary | ICD-10-CM | POA: Insufficient documentation

## 2014-09-14 DIAGNOSIS — I10 Essential (primary) hypertension: Secondary | ICD-10-CM | POA: Insufficient documentation

## 2014-09-14 DIAGNOSIS — Z951 Presence of aortocoronary bypass graft: Secondary | ICD-10-CM

## 2014-09-14 DIAGNOSIS — I219 Acute myocardial infarction, unspecified: Secondary | ICD-10-CM | POA: Diagnosis not present

## 2014-09-14 HISTORY — PX: TRANSTHORACIC ECHOCARDIOGRAM: SHX275

## 2014-09-14 NOTE — Progress Notes (Signed)
2D Echo Performed 09/14/2014    Jezebel Pollet, RCS  

## 2014-09-14 NOTE — Progress Notes (Signed)
Quick Note:  Echo results: Good news: Essentially normal echocardiogram and normal pump function and normal valve function. Mild thickening of the ventricle walls - suggests long-standing high blood pressure. EF: 55-60%. No regional wall motion abnormalities.No sign of significant damage from his heart attack.  [name], MD  ______

## 2014-09-15 ENCOUNTER — Encounter: Payer: Self-pay | Admitting: *Deleted

## 2014-09-15 ENCOUNTER — Telehealth: Payer: Self-pay | Admitting: *Deleted

## 2014-09-15 NOTE — Telephone Encounter (Signed)
Mailed a letter with results.  

## 2014-09-15 NOTE — Telephone Encounter (Signed)
Message copied by Tobin Chad on Wed Sep 15, 2014  9:17 AM ------      Message from: [name]      Created: Tue Sep 14, 2014 11:37 PM       Echo results:      Good news: Essentially normal echocardiogram and normal pump function and normal valve function.  Mild thickening of the ventricle walls - suggests long-standing high blood pressure.      EF: 55-60%.      No regional wall motion abnormalities.No sign of significant damage from his heart attack.            [name], MD       ------

## 2014-10-26 ENCOUNTER — Encounter: Payer: Self-pay | Admitting: Cardiology

## 2014-11-12 IMAGING — CR DG CHEST 2V
2 series · 2 of 2 positions shown · non-contrast
Comparison: 05/22/2014 and prior chest radiographs

CLINICAL DATA: 57-year-old male with chest pain.  History of CABG.

EXAM:
CHEST  2 VIEW

[w chest lat]
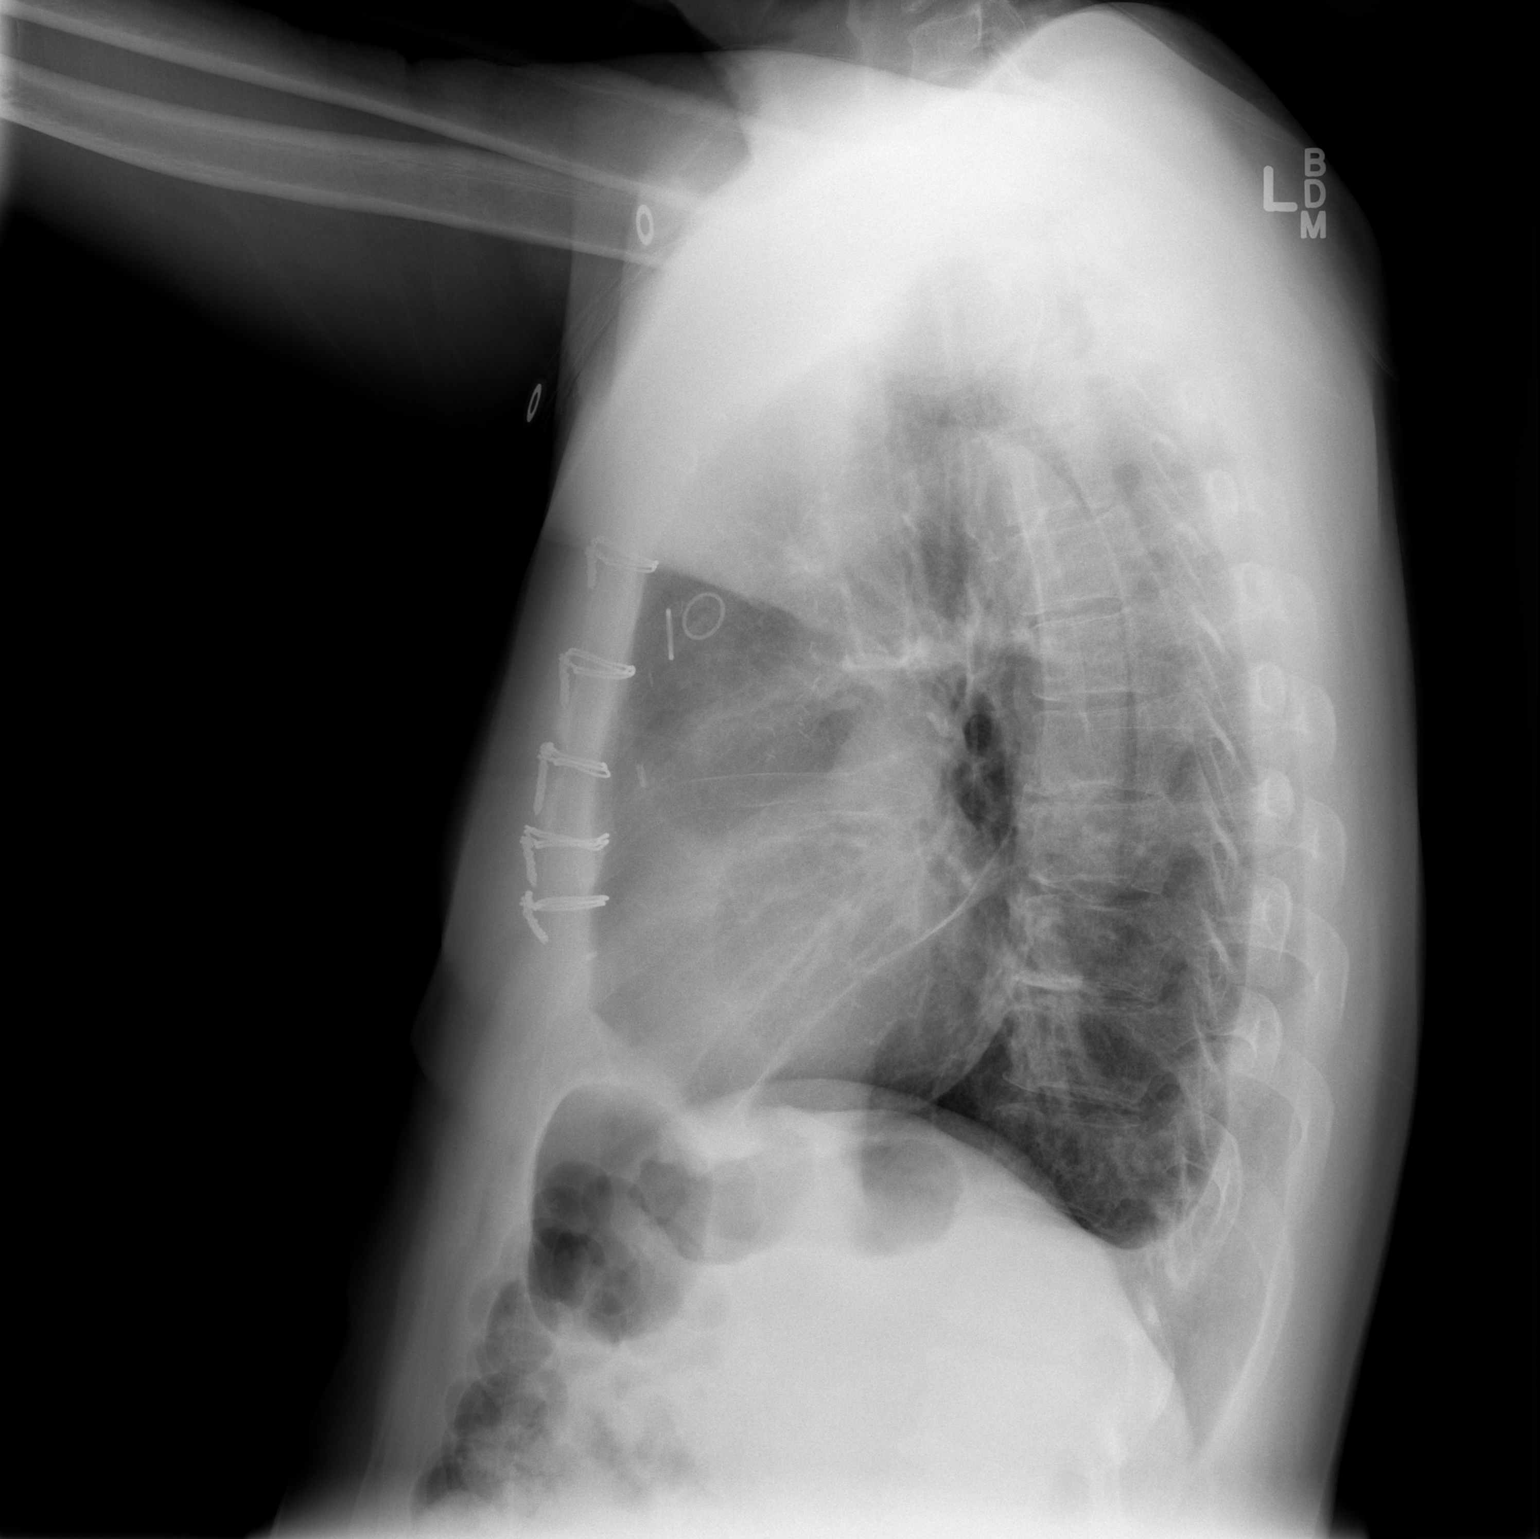

[w chest pa]
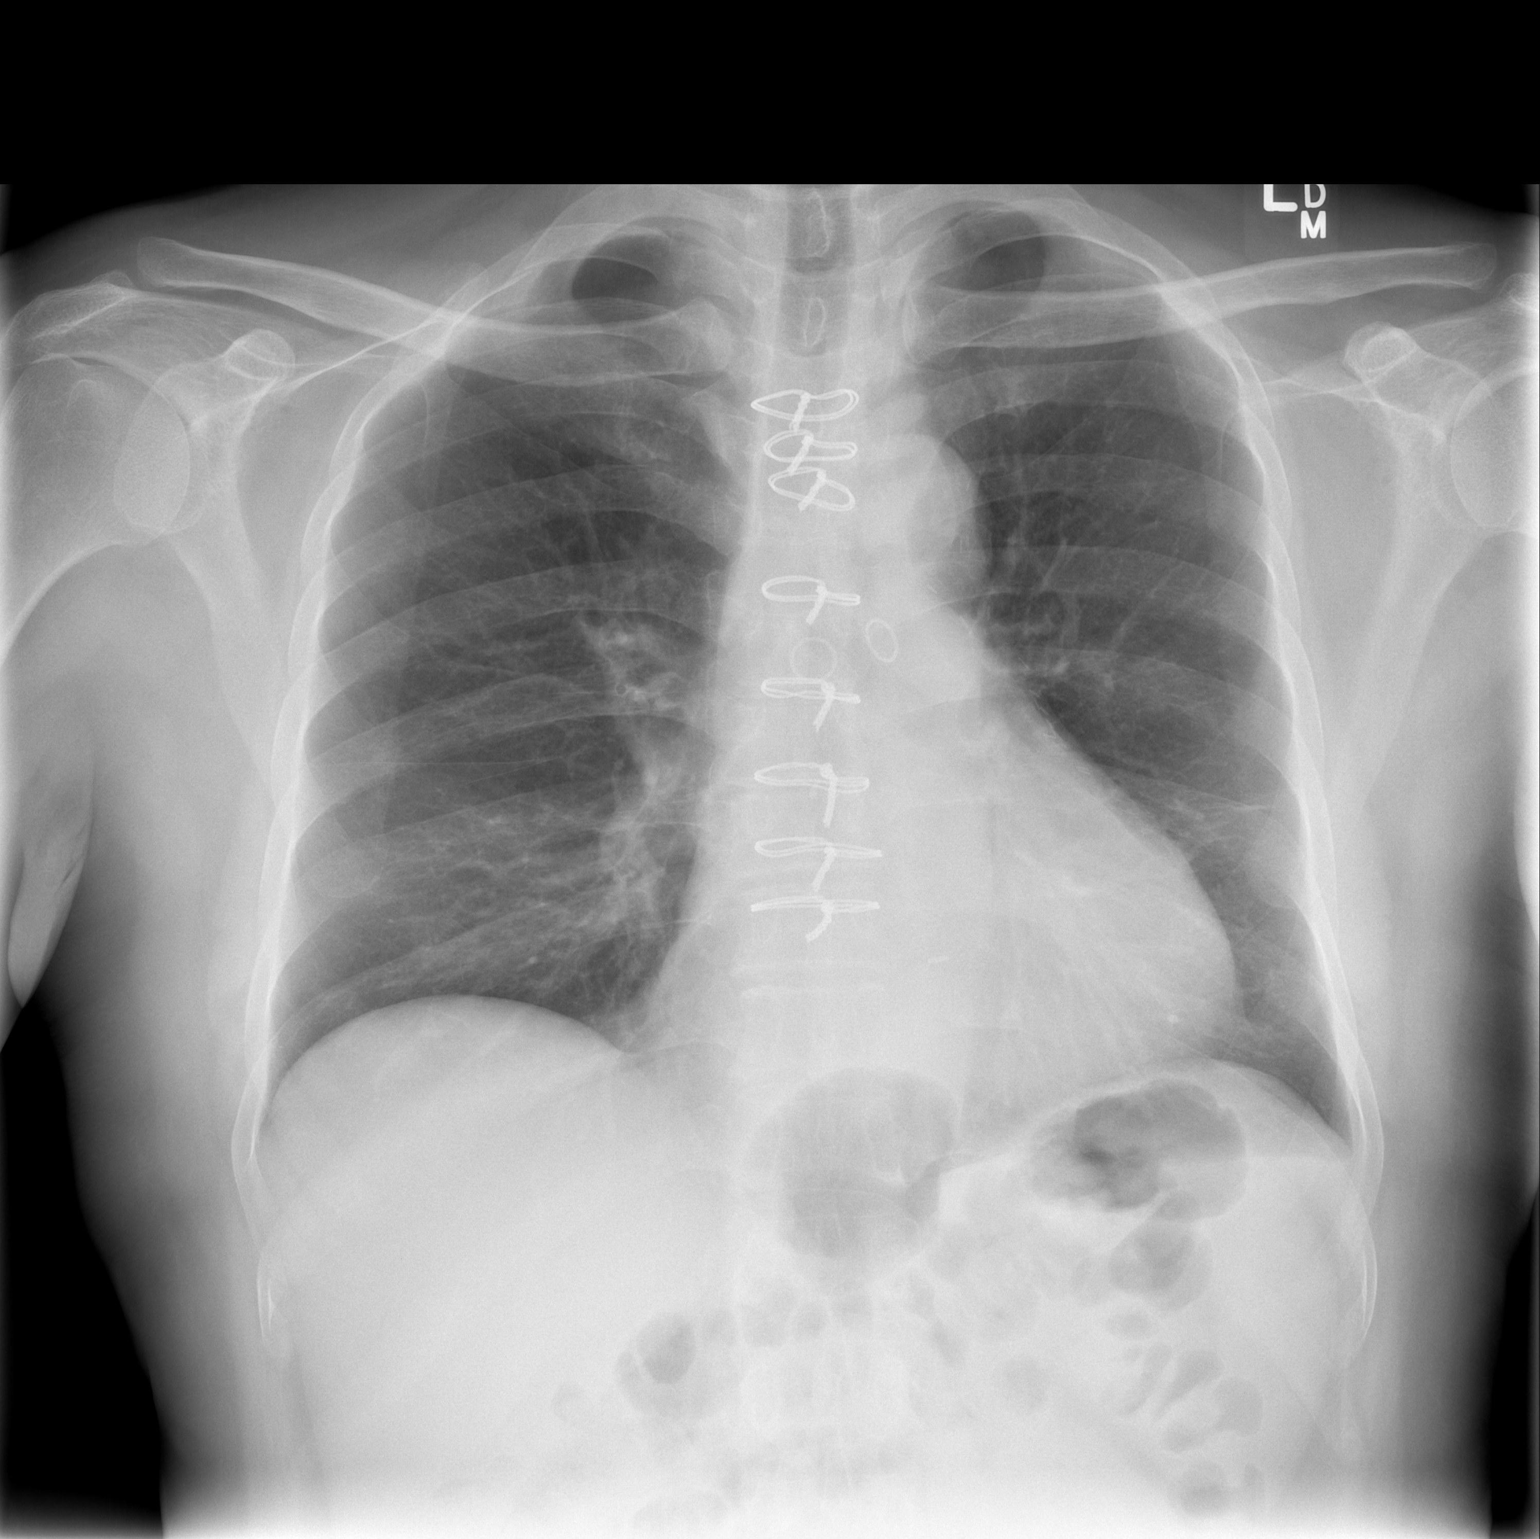

[2 of 2 positions shown; findings below may reference images not displayed]

FINDINGS: Cardiomegaly and CABG changes again noted.

A small left pleural effusion is again noted.

There is no evidence of focal airspace disease, pulmonary edema,
suspicious pulmonary nodule/mass, right pleural effusion, or
pneumothorax. No acute bony abnormalities are identified.
IMPRESSION: Cardiomegaly, CABG changes and small stable left pleural effusion.

No other significant change or abnormality identified.

## 2014-11-17 ENCOUNTER — Other Ambulatory Visit: Payer: Self-pay | Admitting: Physician Assistant

## 2014-12-09 ENCOUNTER — Encounter (HOSPITAL_COMMUNITY): Payer: Self-pay | Admitting: Cardiology

## 2015-05-16 ENCOUNTER — Encounter: Payer: Self-pay | Admitting: Thoracic Surgery (Cardiothoracic Vascular Surgery)

## 2015-05-16 ENCOUNTER — Ambulatory Visit (INDEPENDENT_AMBULATORY_CARE_PROVIDER_SITE_OTHER): Payer: Medicaid Other | Admitting: Thoracic Surgery (Cardiothoracic Vascular Surgery)

## 2015-05-16 VITALS — BP 193/120 | HR 100 | Resp 19 | Ht 67.0 in | Wt 192.0 lb

## 2015-05-16 DIAGNOSIS — I2510 Atherosclerotic heart disease of native coronary artery without angina pectoris: Secondary | ICD-10-CM | POA: Diagnosis not present

## 2015-05-16 DIAGNOSIS — Z951 Presence of aortocoronary bypass graft: Secondary | ICD-10-CM

## 2015-05-16 MED ORDER — ATORVASTATIN CALCIUM 40 MG PO TABS: 40.0000 mg | ORAL_TABLET | Freq: Every day | ORAL | Status: DC

## 2015-05-16 MED ORDER — METOPROLOL TARTRATE 25 MG PO TABS: 25.0000 mg | ORAL_TABLET | Freq: Two times a day (BID) | ORAL | Status: DC

## 2015-05-16 MED ORDER — LISINOPRIL 10 MG PO TABS: 10.0000 mg | ORAL_TABLET | Freq: Every day | ORAL | Status: DC

## 2015-05-16 NOTE — Patient Instructions (Signed)
Schedule follow up appointment with Dr [name] as soon as possible  Resume taking aspirin every day (325 mg)  Resume taking blood pressure medications including metoprolol and lisinopril as prescribed  Begin taking Lipitor as prescribed

## 2015-05-16 NOTE — Progress Notes (Signed)
[address]       [place] [address]             [phone]     CARDIOTHORACIC SURGERY OFFICE NOTE  Referring Provider is [name], MD PCP is No PCP Per Patient   HPI:  Patient returns to the office today for routine followup status post coronary artery bypass grafting x4 on 05/20/2014 for severe three-vessel coronary artery disease status post acute non-ST segment elevation myocardial infarction. His postoperative recovery in the hospital was entirely uncomplicated and he was last seen here in our office on 06/14/2014 at which time he was doing quite well.  Since then he was seen in follow-up by Dr. [name] on 06/16/2014, and a follow-up echocardiogram was performed 09/14/2014 that revealed considerable improvement in his left ventricular systolic function with ejection fraction estimated 55-60%. Unfortunately, since then the patient was lost to all medical follow up. He stopped taking all of his blood pressure medications. He returns to the office for follow-up today with an interpreter present. He states that he stopped taking his medications because he could not afford to have them filled. Since then he has apparently qualified for Medicaid. He has not been seen in follow-up by Dr. [name] or any primary care physician over the past 6 months. The patient reports that he feels well. He has been afraid to go back to work because of the work conditions that he had to experience previously. He denies any exertional shortness of breath or chest discomfort. He has mild residual soreness in the middle of his chest related to his surgical incision that only affects him when he tries to take a deep breath. Overall he reports feeling quite well. His blood pressure has been very elevated since he stopped taking all blood pressure medications.   Current Outpatient Prescriptions  Medication Sig Dispense Refill  . aspirin 325 MG tablet Take 325 mg by mouth daily as needed  (pain).    Marland Kitchen atorvastatin (LIPITOR) 40 MG tablet Take 1 tablet (40 mg total) by mouth daily. 30 tablet 3  . lisinopril (PRINIVIL,ZESTRIL) 10 MG tablet Take 1 tablet (10 mg total) by mouth daily. 30 tablet 3  . metoprolol tartrate (LOPRESSOR) 25 MG tablet Take 1 tablet (25 mg total) by mouth 2 (two) times daily. 60 tablet 3   No current facility-administered medications for this visit.      Physical Exam:   BP 193/120 mmHg  Pulse 100  Resp 19  Ht 5\' 7"  (1.702 m)  Wt 192 lb (87.091 kg)  BMI 30.06 kg/m2  SpO2 98%  General:  Well-appearing  Chest:   clear  CV:   Regular rate and rhythm without murmur  Incisions:  Completely healed, sternum is stable  Abdomen:  Soft and nontender  Extremities:  Warm and well-perfused  Diagnostic Tests:  Transthoracic Echocardiography  Patient:  Delmo, Matty MR #:    [id number] Study Date: 09/14/2014 Gender:   M Age:    58 Height:   170.2 cm Weight:   74.4 kg BSA:    1.89 m^2 Pt. Status: Room:  ATTENDING  Zoila Shutter MD ORDERING   Bryan Lemma REFERRING  Bryan Lemma SONOGRAPHER Clearence Ped, RCS PERFORMING  [facility], Outpatient  cc:  ------------------------------------------------------------------- LV EF: 55% -  60%  ------------------------------------------------------------------- Indications:   410.91 MI-unspecified.  ------------------------------------------------------------------- History:  PMH:  Coronary artery disease. PMH:  Myocardial infarction. Risk factors: Hypertension. Dyslipidemia.  ------------------------------------------------------------------- Study Conclusions  -  Left ventricle: The cavity size was normal. There was mild concentric hypertrophy. Systolic function was normal. The estimated ejection fraction was in the range of 55% to 60%. Wall motion was normal; there were no regional wall motion abnormalities. Left ventricular diastolic function  parameters were normal. - Aortic valve: There was no regurgitation. - Aortic root: The aortic root was normal in size. - Mitral valve: Structurally normal valve. There was no regurgitation. - Right ventricle: Systolic function was normal. - Right atrium: The atrium was mildly dilated. - Tricuspid valve: There was no regurgitation. - Pulmonary arteries: Systolic pressure was within the normal range. - Inferior vena cava: The vessel was normal in size. The respirophasic diameter changes were in the normal range (= 50%), consistent with normal central venous pressure. - Pericardium, extracardiac: There was no pericardial effusion.  ------------------------------------------------------------------- Labs, prior tests, procedures, and surgery: Coronary artery bypass grafting.  Transthoracic echocardiography. M-mode, complete 2D, spectral Doppler, and color Doppler. Birthdate: Patient birthdate: 1956-02-08. Age: Patient is 59 yr old. Sex: Gender: male. BMI: 25.7 kg/m^2. Blood pressure:   112/72 Patient status: Outpatient. Study date: Study date: 09/14/2014. Study time: 02:39 PM. Location: Echo laboratory.  -------------------------------------------------------------------  ------------------------------------------------------------------- Left ventricle: The cavity size was normal. There was mild concentric hypertrophy. Systolic function was normal. The estimated ejection fraction was in the range of 55% to 60%. Wall motion was normal; there were no regional wall motion abnormalities. The transmitral flow pattern was normal. The deceleration time of the early transmitral flow velocity was normal. The pulmonary vein flow pattern was normal. The tissue Doppler parameters were normal. Left ventricular diastolic function parameters were normal.  ------------------------------------------------------------------- Aortic valve:  Trileaflet; mildly thickened  leaflets. Mobility was not restricted. Doppler: Transvalvular velocity was within the normal range. There was no stenosis. There was no regurgitation.  ------------------------------------------------------------------- Aorta: Aortic root: The aortic root was normal in size.  ------------------------------------------------------------------- Mitral valve:  Structurally normal valve.  Mobility was not restricted. Doppler: Transvalvular velocity was within the normal range. There was no evidence for stenosis. There was no regurgitation.  ------------------------------------------------------------------- Left atrium: LA volume/ BSA = 21.5 ml/m2. The atrium was at the upper limits of normal in size.  ------------------------------------------------------------------- Right ventricle: The cavity size was normal. Wall thickness was normal. Systolic function was normal.  ------------------------------------------------------------------- Pulmonic valve:  Doppler: Transvalvular velocity was within the normal range. There was no evidence for stenosis.  ------------------------------------------------------------------- Tricuspid valve:  Structurally normal valve.  Doppler: Transvalvular velocity was within the normal range. There was no regurgitation.  ------------------------------------------------------------------- Pulmonary artery:  The main pulmonary artery was normal-sized. Systolic pressure was within the normal range.  ------------------------------------------------------------------- Right atrium: The atrium was mildly dilated.  ------------------------------------------------------------------- Pericardium: There was no pericardial effusion.  ------------------------------------------------------------------- Systemic veins: Inferior vena cava: The vessel was normal in size. The respirophasic diameter changes were in the normal range (=  50%), consistent with normal central venous pressure. Diameter: 14 mm.  ------------------------------------------------------------------- Measurements  IVC                     Value    Reference ID                     14  mm   ---------  Left ventricle               Value    Reference LV ID, ED, PLAX chordal           47.5 mm   43 -  52 LV ID, ES, PLAX chordal           34.8 mm   23 - 38 LV fx shortening, PLAX chordal   (L)   27  %   >=29 LV PW thickness, ED             12.3 mm   --------- IVS/LV PW ratio, ED             1.07     <=1.3 Stroke volume, 2D              65  ml   --------- Stroke volume/bsa, 2D            34  ml/m^2 --------- LV e&', lateral               12.3 cm/s  --------- LV E/e&', lateral              5.26     --------- LV e&', medial                5.26 cm/s  --------- LV E/e&', medial               12.3     --------- LV e&', average               8.78 cm/s  --------- LV E/e&', average              7.37     ---------  Ventricular septum             Value    Reference IVS thickness, ED              13.1 mm   ---------  LVOT                    Value    Reference LVOT ID, S                 21  mm   --------- LVOT area                  3.46 cm^2  --------- LVOT peak velocity, S            86.5 cm/s  --------- LVOT mean velocity, S            56.1 cm/s  --------- LVOT VTI, S                 18.9 cm   ---------  Aorta                    Value    Reference Aortic root ID, ED             34  mm    ---------  Left atrium                 Value    Reference LA ID, A-P, ES               40  mm   --------- LA ID/bsa, A-P               2.12 cm/m^2 <=2.2  Mitral valve                Value    Reference Mitral E-wave peak velocity         64.7 cm/s  --------- Mitral A-wave peak velocity         53.8 cm/s  ---------  Mitral deceleration time      (H)   331  ms   150 - 230 Mitral E/A ratio, peak           1.2     ---------  Pulmonary arteries             Value    Reference PA pressure, S, DP             15  mm Hg <=30  Tricuspid valve               Value    Reference Tricuspid regurg peak velocity       176  cm/s  --------- Tricuspid peak RV-RA gradient        12  mm Hg --------- Tricuspid maximal regurg velocity,     176  cm/s  --------- PISA  Systemic veins               Value    Reference Estimated CVP                3   mm Hg ---------  Right ventricle               Value    Reference RV pressure, S, DP             15  mm Hg <=30 RV s&', lateral, S              9.54 cm/s  ---------  Legend: (L) and (H) mark values outside specified reference range.  ------------------------------------------------------------------- Prepared and Electronically Authenticated by  Tobias Alexander, M.D. 2015-09-15T18:58:26   Impression:  The patient is clinically doing well proximally one year following coronary artery bypass grafting. However, he stopped taking all blood pressure medications 6 months ago and his blood pressure is quite elevated in our office today. He has not had any symptoms to suggest recurrence of symptomatic coronary artery disease. He has been lost to follow-up by his  cardiologist and he has never been able to establish care with a primary care physician.  Plan:  I have instructed the patient to resume taking aspirin 325 mg daily. I have reviewed his prescriptions for metoprolol and lisinopril today. I've also given the patient a prescription for Lipitor. We will call Dr. Elissa Hefty office to make sure the patient has a follow-up appointment scheduled at some point in the near future. We will also try to assist the patient with finding a primary care physician. In the future he will call and return to see [place] here as needed.  I spent in excess of 15 minutes during the conduct of this office consultation and >50% of this time involved direct face-to-face encounter with the patient for counseling and/or coordination of their care.   Salvatore Decent. [name], MD 05/16/2015 1:36 PM

## 2015-05-17 ENCOUNTER — Telehealth: Payer: Self-pay | Admitting: Cardiology

## 2015-05-17 ENCOUNTER — Other Ambulatory Visit: Payer: Self-pay | Admitting: *Deleted

## 2015-05-17 NOTE — Telephone Encounter (Signed)
Returned call to [place] with Dr.Owen's office.Dr.Owen saw patient 05/16/15 B/P elevated 193/120.Patient had stopped medication.Dr.Owen refilled medications and wants pt seen asap.Patient's nephew called appointment offered tomorrow 05/18/15 with Norma Fredrickson NP.Stated will not be able to bring patient already has a appointment.Schedules are full.Spoke to [place] in scheduling she is working on appointment.

## 2015-05-17 NOTE — Telephone Encounter (Signed)
Returned call to nephew appointment scheduled with [name] PA 05/24/15 at 9:00 am at [facility] office.Stated patient's B/P better this morning.Advised to keep appointment with Lorin Picket.Directions given to [facility] office.Stated he would like appointment card mailed to patient.Appointment card mailed to patient's home.

## 2015-05-17 NOTE — Telephone Encounter (Signed)
Roger Howe called in stating that Dr. [name] wants the pt to be seen as soon as possible Dr. [name] because the pt was suppose to f/u 4 months after his last appt in June ,which never happened. Please call her back  Thanks

## 2015-05-18 ENCOUNTER — Ambulatory Visit: Payer: Medicaid Other | Admitting: Nurse Practitioner

## 2015-05-18 NOTE — Telephone Encounter (Signed)
Sounds good.   I will look forward to Scott's sagely words of wisdom  [facility]

## 2015-05-24 ENCOUNTER — Encounter: Payer: Self-pay | Admitting: Physician Assistant

## 2015-05-24 ENCOUNTER — Ambulatory Visit (INDEPENDENT_AMBULATORY_CARE_PROVIDER_SITE_OTHER): Payer: Medicaid Other | Admitting: Physician Assistant

## 2015-05-24 VITALS — BP 148/102 | HR 60 | Ht 67.0 in | Wt 200.0 lb

## 2015-05-24 DIAGNOSIS — E785 Hyperlipidemia, unspecified: Secondary | ICD-10-CM

## 2015-05-24 DIAGNOSIS — R0683 Snoring: Secondary | ICD-10-CM

## 2015-05-24 DIAGNOSIS — I2510 Atherosclerotic heart disease of native coronary artery without angina pectoris: Secondary | ICD-10-CM

## 2015-05-24 DIAGNOSIS — I700 Atherosclerosis of aorta: Secondary | ICD-10-CM

## 2015-05-24 DIAGNOSIS — I10 Essential (primary) hypertension: Secondary | ICD-10-CM

## 2015-05-24 LAB — BASIC METABOLIC PANEL
BUN: 21 mg/dL (ref 6–23)
CO2: 29 meq/L (ref 19–32)
Calcium: 9 mg/dL (ref 8.4–10.5)
Chloride: 104 mEq/L (ref 96–112)
Creatinine, Ser: 0.92 mg/dL (ref 0.40–1.50)
GFR: 88.99 mL/min (ref >60.00)
GLUCOSE: 85 mg/dL (ref 70–99)
Potassium: 3.5 mEq/L (ref 3.5–5.1)
Sodium: 139 mEq/L (ref 135–145)

## 2015-05-24 MED ORDER — HYDROCHLOROTHIAZIDE 12.5 MG PO CAPS: 12.5000 mg | ORAL_CAPSULE | Freq: Every day | ORAL | Status: DC

## 2015-05-24 MED ORDER — ASPIRIN EC 81 MG PO TBEC: 81.0000 mg | DELAYED_RELEASE_TABLET | Freq: Every day | ORAL | Status: DC

## 2015-05-24 NOTE — Patient Instructions (Signed)
Medication Instructions:  1.  TAKE ASPIRIN 325 MG ONLY ONCE A DAY; FINISH YOUR BOTTLE OF ASPIRIN 325 MG   2. ONCE YOUR FINISH ASPIRIN 325 MG YOU WILL START ON ASPIRIN 81 MG 1 TABLET DAILY  3. START HCTZ 12.5 MG DAILY  Labwork: 1. TODAY; BMET  2. 1 WEEK BMET  3. 6-8 WEEKS FOR FASTING LIPID AND LIVER PANEL  Testing/Procedures: Your physician has recommended that you have a SPLIT NIGHT sleep study. This test records several body functions during sleep, including: brain activity, eye movement, oxygen and carbon dioxide blood levels, heart rate and rhythm, breathing rate and rhythm, the flow of air through your mouth and nose, snoring, body muscle movements, and chest and belly movement.  Follow-Up: 1. SCOTT WEAVER, PAC 2-3 WEEKS  2. DR. [name] 3 MONTHS  Any Other Special Instructions Will Be Listed Below (If Applicable).

## 2015-05-24 NOTE — Progress Notes (Signed)
Cardiology Office Note   Date:  05/24/2015   ID:  Roger, Howe [birth year], MRN [id number]  PCP:  No PCP Per Patient  Cardiologist:  Dr. Bryan Lemma     Chief Complaint  Patient presents with  . Follow-up    Hypertension  . Coronary Artery Disease     History of Present Illness: Roger Howe is a 59 y.o. male with a hx of CAD status post CABG in 5/15, HTN, HL. Patient presented in 5/15 with a non-STEMI. LHC demonstrated 3 vessel disease. Echocardiogram demonstrated an EF of 40-45% with diffuse hypokinesis. He underwent CABG 4 with Dr. [name] (LIMA-LAD, SVG-PDA, SVG-RPL2, SVG-DX). Last seen by Dr. [name] 05/2014.  Echocardiogram in 9/15 demonstrated improved LV function with an EF of 55-60%. He recently saw Dr. [name] 05/16/15. The patient was off all of his medications due to financial reasons. Blood pressure was 193/120. His medications were resumed and he returns today for urgent follow-up.  He is seen today with the help of an interpreter.  He has continued to have incisional pain since his CABG last year.  He denies recurrent angina.  He is walking 2 miles a day.  He denies significant dyspnea.  He denies orthopnea, PND, edema.  He denies syncope.  He has some residual pain around the incision in his R leg as well (SVG harvest). He denies symptoms of claudication.  He has occasional HAs.  He denies significant visual changes.     Studies/Reports Reviewed Today:  Echo 09/14/14 - Mildconcentric hypertrophy. EF 55% to 60%. Wallmotion was normal - Aortic valve: There was no regurgitation. - Aortic root: The aortic root was normal in size. - Mitral valve: Structurally normal valve. There was noregurgitation. - Right ventricle: Systolic function was normal. - Right atrium: The atrium was mildly dilated. - Tricuspid valve: There was no regurgitation. - Pulmonary arteries: Systolic pressure was within the normalrange.  Carotid US 05/19/14 Bilateral ICA 1-39%  LHC 05/18/14 EF  50-55% LM:  Ok LAD:  Proximal 95-99%, left-left and right-left collaterals, mid 50-60% LCx:  AV groove 95% RI: 50% ostial, 80-90% proximal RCA: Ostial RPDA 95%, RPL 20%  Abdominal CT 5/15 IMPRESSION: 1. No evidence of aortic dissection or aneurysm. 2. Coronary artery atherosclerosis. 3. Advanced infrarenal abdominal aortic and pelvic atherosclerosis.  Ulcerative plaque, with possible focal dissection involving the left common iliac artery.   Past Medical History  Diagnosis Date  . Essential hypertension   . NSTEMI (non-ST elevated myocardial infarction) 05/17/2014    Post CABG Echo 09/19/2014: Normal EF 55-60% with mild Conc LVH. No regional WMA, essentially normal  . 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 Abdominal aortic atherosclerosis     Seen on catheterization    Past Surgical History  Procedure Laterality Date  . Cardiac catheterization  05/17/2014    DR HARDING: 95% proximal LAD, 90% ramus intermedius, 95% mid circumflex after OM1, neither percent RPDA  . Coronary artery bypass graft N/A 05/20/2014    Procedure: CORONARY ARTERY BYPASS GRAFTING (CABG) x 4,  LIMA-MID LAD,  SVG-DIAGONAL,  SEQUENTIAL SVG -PDA, PLB #2;  Surgeon: [name], MD;  Location: [facility] OR;  Service: Open Heart Surgery;  Laterality: N/A;  . Intraoperative transesophageal echocardiogram N/A 05/20/2014    Procedure: INTRAOPERATIVE TRANSESOPHAGEAL ECHOCARDIOGRAM;  Surgeon: [name], MD;  Location: [facility] OR;  Service: Open Heart Surgery;  Laterality: N/A;  . Transthoracic echocardiogram  09/14/2014    Normal EF 55-60% with mild Conc LVH. No regional WMA, essentially normal  . Left heart catheterization with coronary angiogram N/A 05/18/2014    Procedure: LEFT HEART CATHETERIZATION WITH CORONARY ANGIOGRAM;  Surgeon: [name], MD;  Location: [facility] CATH LAB;  Service: Cardiovascular;  Laterality:  N/A;     Current Outpatient Prescriptions  Medication Sig Dispense Refill  . atorvastatin (LIPITOR) 40 MG tablet Take 1 tablet (40 mg total) by mouth daily. 30 tablet 3  . lisinopril (PRINIVIL,ZESTRIL) 10 MG tablet Take 1 tablet (10 mg total) by mouth daily. 30 tablet 3  . metoprolol tartrate (LOPRESSOR) 25 MG tablet Take 1 tablet (25 mg total) by mouth 2 (two) times daily. 60 tablet 3  . aspirin EC 81 MG tablet Take 1 tablet (81 mg total) by mouth daily. 90 tablet 3  . hydrochlorothiazide (MICROZIDE) 12.5 MG capsule Take 1 capsule (12.5 mg total) by mouth daily. 30 capsule 11   No current facility-administered medications for this visit.    Allergies:   Review of patient's allergies indicates no known allergies.    Social History:  The patient  reports that he has never smoked. He has never used smokeless tobacco. He reports that he does not drink alcohol or use illicit drugs.   Family History:  The patient's family history includes Heart disease in his brother. There is no history of Heart attack or Stroke.    ROS:   Please see the history of present illness.   Review of Systems  Cardiovascular: Positive for chest pain.  Respiratory: Positive for snoring.   All other systems reviewed and are negative.     PHYSICAL EXAM: VS:  BP 148/102 mmHg  Pulse 60  Ht  (1.702 m)  Wt 200 lb (90.719 kg)  BMI 31.32 kg/m2    Wt Readings from Last 3 Encounters:  05/24/15 200 lb (90.719 kg)  05/16/15 192 lb (87.091 kg)  06/16/14 164 lb 3.2 oz (74.481 kg)     GEN: Well nourished, well developed, in no acute distress HEENT: normal Neck: no JVD,  no masses Cardiac:  Normal S1/S2, RRR; no murmur ,  no rubs or gallops, no edema  Chest:  Median sternotomy incision with some keloid formation Respiratory:  clear to auscultation bilaterally, no wheezing, rhonchi or rales. GI: soft, nontender, nondistended, + BS MS: no deformity or atrophy Skin: warm and dry  Neuro:  CNs II-XII intact,  Strength and sensation are intact Psych: Normal affect   EKG:  EKG is ordered today.  It demonstrates:   NSR, HR 60, normal axis, IVCD, PRWP, NSSTTW changes, no change from prior tracing.     Recent Labs: 08/25/2014: ALT 16; BUN 7; Creatinine 0.88; Potassium 4.0; Sodium 141    Lipid Panel    Component Value Date/Time   CHOL 126 08/25/2014 0856   TRIG 130 08/25/2014 0856   HDL 28* 08/25/2014 0856   CHOLHDL 4.5 08/25/2014 0856   VLDL 26 08/25/2014 0856   LDLCALC 72 08/25/2014 0856      ASSESSMENT AND PLAN:  Essential hypertension:  BP is better but needs better control.    -  Continue current dose of Metoprolol and Lisinopril.  -  Add HCTZ 12.5 mg QD.  If tolerated, can eventually change to Lisinopril/HCTZ.  -  Check BMET today and repeat BMET 1 week.  Coronary artery disease involving native coronary artery of native  heart without angina pectoris:  No angina.  He has some residual incisional pain.  This may be from his keloid.  Continue ASA, statin, beta-blocker, ACE inhibitor.  He can change ASA to 81 mg QD.    Hyperlipidemia:  Continue statin.  Check Lipids and LFTs in 6 weeks. .   Abdominal aortic atherosclerosis:  Continue ASA, statin.  He has some incisional pain in his R leg but no claudication symptoms.    Snoring:  With uncontrolled BP and snoring symptoms, we should rule out significant OSA.  I will set up a sleep test.    Current medicines are reviewed at length with the patient today.  Concerns regarding medicines are as outlined above.  The following changes have been made:    Start HCTZ 12.5 mg QD  Decrease ASA 81 mg QD   Labs/ tests ordered today include:  Orders Placed This Encounter  Procedures  . Basic metabolic panel  . Basic metabolic panel  . Lipid panel  . Hepatic function panel  . EKG 12-Lead    Disposition:   FU with me in 2-3 weeks and Dr. Bryan Lemma in 3 mos.    Signed, Brynda Rim, MHS 05/24/2015 10:11 AM    [facility] [address] [place], [place], [place]  [address] Phone: [phone]; Fax: [phone]

## 2015-05-31 ENCOUNTER — Other Ambulatory Visit (INDEPENDENT_AMBULATORY_CARE_PROVIDER_SITE_OTHER): Payer: Medicaid Other | Admitting: *Deleted

## 2015-05-31 DIAGNOSIS — I10 Essential (primary) hypertension: Secondary | ICD-10-CM | POA: Diagnosis not present

## 2015-05-31 LAB — BASIC METABOLIC PANEL
BUN: 21 mg/dL (ref 6–23)
CO2: 27 mEq/L (ref 19–32)
Calcium: 9.2 mg/dL (ref 8.4–10.5)
Chloride: 103 mEq/L (ref 96–112)
Creatinine, Ser: 1.08 mg/dL (ref 0.40–1.50)
GFR: 73.95 mL/min (ref >60.00)
Glucose, Bld: 95 mg/dL (ref 70–99)
Potassium: 3.8 mEq/L (ref 3.5–5.1)
SODIUM: 137 meq/L (ref 135–145)

## 2015-05-31 NOTE — Addendum Note (Signed)
Addended by: Tonita Phoenix on: 05/31/2015 07:49 AM   Modules accepted: Orders

## 2015-06-03 ENCOUNTER — Encounter: Payer: Self-pay | Admitting: *Deleted

## 2015-06-16 ENCOUNTER — Ambulatory Visit: Payer: Medicaid Other | Admitting: Cardiology

## 2015-06-21 NOTE — Progress Notes (Signed)
Cardiology Office Note   Date:  06/22/2015   ID:  Roger Howe, Roger Howe 08-29-1956, MRN [id number]  PCP:  No PCP Per Patient  Cardiologist:  Dr. Bryan Lemma     Chief Complaint  Patient presents with  . Follow-up    Hypertension     History of Present Illness: Roger Howe is a 59 y.o. male with a hx of CAD status post CABG in 5/15, HTN, HL. Patient presented in 5/15 with a non-STEMI. LHC demonstrated 3 vessel disease. Echocardiogram demonstrated an EF of 40-45% with diffuse hypokinesis. He underwent CABG 4 with Dr. [name] (LIMA-LAD, SVG-PDA, SVG-RPL2, SVG-DX). Last seen by Dr. [name] 05/2014.  Echocardiogram in 9/15 demonstrated improved LV function with an EF of 55-60%. He recently saw Dr. [name] 05/16/15. The patient was off all of his medications due to financial reasons. Blood pressure was 193/120. His medications were resumed.  I saw him last month.  I adjusted his blood pressure medicines. He returns for follow-up. History is obtained today with the assistance of an interpreter. He is also here today with his son. He tells me that he has been short of breath with walking. He was able to go an hour without stopping previously. He now has to stop after 30 minutes. Of note, the heat has been worse over the last couple of weeks. He does feel tired when the temperature is hotter. He denies a smoking history. He denies wheezing or cough. He denies orthopnea, PND, edema. He denies syncope. He has chronic chest wall symptoms since his prior surgery. There has been no significant change.   Studies/Reports Reviewed Today:  Echo 09/14/14 - Mildconcentric hypertrophy. EF 55% to 60%. Wallmotion was normal - Aortic valve: There was no regurgitation. - Aortic root: The aortic root was normal in size. - Mitral valve: Structurally normal valve. There was noregurgitation. - Right ventricle: Systolic function was normal. - Right atrium: The atrium was mildly dilated. - Tricuspid valve: There was no  regurgitation. - Pulmonary arteries: Systolic pressure was within the normalrange.  Carotid US 05/19/14 Bilateral ICA 1-39%  LHC 05/18/14 EF 50-55% LM:  Ok LAD:  Proximal 95-99%, left-left and right-left collaterals, mid 50-60% LCx:  AV groove 95% RI: 50% ostial, 80-90% proximal RCA: Ostial RPDA 95%, RPL 20%  Abdominal CT 5/15 IMPRESSION: 1. No evidence of aortic dissection or aneurysm. 2. Coronary artery atherosclerosis. 3. Advanced infrarenal abdominal aortic and pelvic atherosclerosis.  Ulcerative plaque, with possible focal dissection involving the left common iliac artery.   Past Medical History  Diagnosis Date  . Essential hypertension   . NSTEMI (non-ST elevated myocardial infarction) 05/17/2014    Post CABG Echo 09/19/2014: Normal EF 55-60% with mild Conc LVH. No regional WMA, essentially normal  . 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 Abdominal aortic atherosclerosis     Seen on catheterization    Past Surgical History  Procedure Laterality Date  . Cardiac catheterization  05/17/2014    DR HARDING: 95% proximal LAD, 90% ramus intermedius, 95% mid circumflex after OM1, neither percent RPDA  . Coronary artery bypass graft N/A 05/20/2014    Procedure: CORONARY ARTERY BYPASS GRAFTING (CABG) x 4,  LIMA-MID LAD,  SVG-DIAGONAL,  SEQUENTIAL SVG -PDA, PLB #2;  Surgeon: [name], MD;  Location: [facility] OR;  Service: Open Heart Surgery;  Laterality: N/A;  .  Intraoperative transesophageal echocardiogram N/A 05/20/2014    Procedure: INTRAOPERATIVE TRANSESOPHAGEAL ECHOCARDIOGRAM;  Surgeon: [name], MD;  Location: [facility] OR;  Service: Open Heart Surgery;  Laterality: N/A;  . Transthoracic echocardiogram  09/14/2014    Normal EF 55-60% with mild Conc LVH. No regional WMA, essentially normal  . Left heart catheterization with coronary angiogram N/A 05/18/2014    Procedure:  LEFT HEART CATHETERIZATION WITH CORONARY ANGIOGRAM;  Surgeon: [name], MD;  Location: [facility] CATH LAB;  Service: Cardiovascular;  Laterality: N/A;     Current Outpatient Prescriptions  Medication Sig Dispense Refill  . aspirin 325 MG EC tablet Take 325 mg by mouth daily.    Marland Kitchen atorvastatin (LIPITOR) 40 MG tablet Take 1 tablet (40 mg total) by mouth daily. 30 tablet 3  . hydrochlorothiazide (MICROZIDE) 12.5 MG capsule Take 1 capsule (12.5 mg total) by mouth daily. 30 capsule 11  . lisinopril (PRINIVIL,ZESTRIL) 10 MG tablet Take 1 tablet (10 mg total) by mouth daily. 30 tablet 3  . metoprolol tartrate (LOPRESSOR) 25 MG tablet Take 1 tablet (25 mg total) by mouth 2 (two) times daily. 60 tablet 3  . aspirin EC 81 MG tablet Take 1 tablet (81 mg total) by mouth daily. (Patient not taking: Reported on 06/22/2015) 90 tablet 3   No current facility-administered medications for this visit.    Allergies:   Review of patient's allergies indicates no known allergies.    Social History:  The patient  reports that he has never smoked. He has never used smokeless tobacco. He reports that he does not drink alcohol or use illicit drugs.   Family History:  The patient's family history includes Heart disease in his brother. There is no history of Heart attack or Stroke.    ROS:   Please see the history of present illness.   Review of Systems  Constitution: Positive for decreased appetite, diaphoresis and malaise/fatigue.  Cardiovascular: Positive for chest pain and dyspnea on exertion.  Respiratory: Positive for snoring.   Musculoskeletal: Positive for joint pain.  Neurological: Positive for loss of balance.  All other systems reviewed and are negative.     PHYSICAL EXAM: VS:  BP 140/90 mmHg  Pulse 75  Ht  (1.702 m)  Wt 201 lb (91.173 kg)  BMI 31.47 kg/m2  SpO2 98%    Wt Readings from Last 3 Encounters:  06/22/15 201 lb (91.173 kg)  05/24/15 200 lb (90.719 kg)  05/16/15 192 lb  (87.091 kg)     GEN: Well nourished, well developed, in no acute distress HEENT: normal Neck: no JVD,  no masses Cardiac:  Normal S1/S2, RRR; no murmur ,  no rubs or gallops, no edema   Respiratory:  Decreased breath sounds bilaterally, no wheezing, rhonchi or rales. GI: soft, nontender, nondistended, + BS MS: no deformity or atrophy Skin: warm and dry  Neuro:  CNs II-XII intact, Strength and sensation are intact Psych: Normal affect   EKG:  EKG is not ordered today.  It demonstrates:   N/a  Recent Labs: 08/25/2014: ALT 16 05/31/2015: BUN 21; Creatinine, Ser 1.08; Potassium 3.8; Sodium 137    Lipid Panel    Component Value Date/Time   CHOL 126 08/25/2014 0856   TRIG 130 08/25/2014 0856   HDL 28* 08/25/2014 0856   CHOLHDL 4.5 08/25/2014 0856   VLDL 26 08/25/2014 0856   LDLCALC 72 08/25/2014 0856      ASSESSMENT AND PLAN:  Essential hypertension:  Controlled. Continue current therapy. Check follow-up  BMET.  Coronary artery disease involving native coronary artery of native heart without angina pectoris:  No angina.  He has some residual incisional pain.  This may be from his keloid.  Continue ASA, statin, beta-blocker, ACE inhibitor.     Hyperlipidemia:  Continue statin.     Abdominal aortic atherosclerosis:  Continue ASA, statin.  He has some incisional pain in his R leg but no claudication symptoms.    Snoring:  A sleep test is pending.      Shortness of breath: I suspect that this is related to fatigue from the heat. Lung exam is unremarkable. He does not look volume overloaded. I will obtain a BMET, BNP today. If BNP abnormal, obtain chest x-ray and echocardiogram.   Current medicines are reviewed at length with the patient today.  Concerns regarding medicines are as outlined above.  The following changes have been made:   Modified Medications   No medications on file   Discontinued Medications   No medications on file   New Prescriptions   No medications on  file    Labs/ tests ordered today include:  Orders Placed This Encounter  Procedures  . Basic Metabolic Panel (BMET)  . B Nat Peptide    Disposition:   FU  Bryan Lemma as planned.    Signed, Brynda Rim, MHS 06/22/2015 3:40 PM    [facility] [address] [place], [place], [place]  [address] Phone: [phone]; Fax: [phone]

## 2015-06-22 ENCOUNTER — Encounter: Payer: Self-pay | Admitting: Physician Assistant

## 2015-06-22 ENCOUNTER — Ambulatory Visit (INDEPENDENT_AMBULATORY_CARE_PROVIDER_SITE_OTHER): Payer: Medicaid Other | Admitting: Physician Assistant

## 2015-06-22 VITALS — BP 140/90 | HR 75 | Ht 67.0 in | Wt 201.0 lb

## 2015-06-22 DIAGNOSIS — R0683 Snoring: Secondary | ICD-10-CM

## 2015-06-22 DIAGNOSIS — I10 Essential (primary) hypertension: Secondary | ICD-10-CM | POA: Diagnosis not present

## 2015-06-22 DIAGNOSIS — I700 Atherosclerosis of aorta: Secondary | ICD-10-CM | POA: Diagnosis not present

## 2015-06-22 DIAGNOSIS — I2510 Atherosclerotic heart disease of native coronary artery without angina pectoris: Secondary | ICD-10-CM

## 2015-06-22 DIAGNOSIS — R0602 Shortness of breath: Secondary | ICD-10-CM | POA: Diagnosis not present

## 2015-06-22 DIAGNOSIS — E785 Hyperlipidemia, unspecified: Secondary | ICD-10-CM

## 2015-06-22 LAB — BASIC METABOLIC PANEL
BUN: 16 mg/dL (ref 6–23)
CALCIUM: 9.3 mg/dL (ref 8.4–10.5)
CHLORIDE: 103 meq/L (ref 96–112)
CO2: 27 meq/L (ref 19–32)
Creatinine, Ser: 0.96 mg/dL (ref 0.40–1.50)
GFR: 84.7 mL/min (ref >60.00)
Glucose, Bld: 86 mg/dL (ref 70–99)
Potassium: 3.6 mEq/L (ref 3.5–5.1)
SODIUM: 138 meq/L (ref 135–145)

## 2015-06-22 LAB — BRAIN NATRIURETIC PEPTIDE: Pro B Natriuretic peptide (BNP): 50 pg/mL (ref 0.0–100.0)

## 2015-06-22 NOTE — Patient Instructions (Signed)
Medication Instructions:  1. CONTINUE ON YOUR CURRENT MEDICATIONS  2. WHEN YOU ARE GETTING LOW ON LISINOPRIL AND HCTZ YOU WILL NEED TO CALL THE OFFICE [phone] TO CHANGE TO LISINOPRIL/HCTZ 10/12.5 MG  DAILY  Labwork: TODAY BMET, BNP  Testing/Procedures: NONE  Follow-Up: KEEP YOUR APPR WITH DR. HARDING AS PLANNED   Any Other Special Instructions Will Be Listed Below (If Applicable).

## 2015-06-27 ENCOUNTER — Telehealth: Payer: Self-pay | Admitting: *Deleted

## 2015-06-27 ENCOUNTER — Encounter: Payer: Self-pay | Admitting: *Deleted

## 2015-06-27 NOTE — Telephone Encounter (Signed)
Man answered today though did not understand English and hung up on me.

## 2015-07-14 ENCOUNTER — Telehealth: Payer: Self-pay | Admitting: Cardiology

## 2015-07-14 MED ORDER — LISINOPRIL-HYDROCHLOROTHIAZIDE 10-12.5 MG PO TABS: 1.0000 | ORAL_TABLET | Freq: Every day | ORAL | Status: DC

## 2015-07-14 NOTE — Telephone Encounter (Signed)
°  1. Which medications need to be refilled? Lisinopril, HCTZ  2. Which pharmacy is medication to be sent to?[facility]   3. Do they need a 30 day or 90 day supply? Did not specify   4. Would they like a call back once the medication has been sent to the pharmacy? Yes   * HIS NEPHEW CALLED IN WANTING TO KNOW IF DR. HARDING WANTED TO COMBINE THE TWO MEDICATIONS AS ONE *

## 2015-07-14 NOTE — Telephone Encounter (Signed)
E- sent new prescription of lisinopril -Hctz 10 /12.5 mg  #90 day supply with 3 refills  Discontinue separate medications.  Nephew is aware.

## 2015-07-19 ENCOUNTER — Other Ambulatory Visit (INDEPENDENT_AMBULATORY_CARE_PROVIDER_SITE_OTHER): Payer: Medicaid Other

## 2015-07-19 DIAGNOSIS — E785 Hyperlipidemia, unspecified: Secondary | ICD-10-CM | POA: Diagnosis not present

## 2015-07-19 LAB — LIPID PANEL
CHOL/HDL RATIO: 5
Cholesterol: 214 mg/dL — ABNORMAL HIGH (ref 0–200)
HDL: 47.3 mg/dL (ref >39.00)
NONHDL: 166.7
TRIGLYCERIDES: 299 mg/dL — AB (ref 0.0–149.0)
VLDL: 59.8 mg/dL — ABNORMAL HIGH (ref 0.0–40.0)

## 2015-07-19 LAB — HEPATIC FUNCTION PANEL
ALT: 21 U/L (ref 0–53)
AST: 18 U/L (ref 0–37)
Albumin: 4.2 g/dL (ref 3.5–5.2)
Alkaline Phosphatase: 62 U/L (ref 39–117)
Bilirubin, Direct: 0.1 mg/dL (ref 0.0–0.3)
Total Bilirubin: 0.6 mg/dL (ref 0.2–1.2)
Total Protein: 7.6 g/dL (ref 6.0–8.3)

## 2015-07-19 LAB — LDL CHOLESTEROL, DIRECT: LDL DIRECT: 118 mg/dL

## 2015-07-27 ENCOUNTER — Telehealth: Payer: Self-pay | Admitting: *Deleted

## 2015-07-27 NOTE — Telephone Encounter (Signed)
I lvm w/brother Moeun that I tried to call nephew [name] but his phone is busy x 4. I asked for him to please ask nephew tcb so that I may go over results for pt; DPR on file.

## 2015-07-28 ENCOUNTER — Encounter: Payer: Self-pay | Admitting: *Deleted

## 2015-08-03 ENCOUNTER — Ambulatory Visit (HOSPITAL_BASED_OUTPATIENT_CLINIC_OR_DEPARTMENT_OTHER): Payer: Medicaid Other | Attending: Physician Assistant | Admitting: Radiology

## 2015-08-03 DIAGNOSIS — G4761 Periodic limb movement disorder: Secondary | ICD-10-CM | POA: Insufficient documentation

## 2015-08-03 DIAGNOSIS — G4719 Other hypersomnia: Secondary | ICD-10-CM | POA: Insufficient documentation

## 2015-08-03 DIAGNOSIS — I493 Ventricular premature depolarization: Secondary | ICD-10-CM | POA: Insufficient documentation

## 2015-08-03 DIAGNOSIS — I2510 Atherosclerotic heart disease of native coronary artery without angina pectoris: Secondary | ICD-10-CM | POA: Diagnosis not present

## 2015-08-03 DIAGNOSIS — R0683 Snoring: Secondary | ICD-10-CM | POA: Insufficient documentation

## 2015-08-26 ENCOUNTER — Ambulatory Visit (INDEPENDENT_AMBULATORY_CARE_PROVIDER_SITE_OTHER): Payer: Medicaid Other | Admitting: Cardiology

## 2015-08-26 ENCOUNTER — Encounter: Payer: Self-pay | Admitting: Cardiology

## 2015-08-26 VITALS — BP 158/106 | HR 85 | Ht 67.0 in | Wt 200.0 lb

## 2015-08-26 DIAGNOSIS — I10 Essential (primary) hypertension: Secondary | ICD-10-CM

## 2015-08-26 DIAGNOSIS — I700 Atherosclerosis of aorta: Secondary | ICD-10-CM

## 2015-08-26 DIAGNOSIS — Z951 Presence of aortocoronary bypass graft: Secondary | ICD-10-CM

## 2015-08-26 DIAGNOSIS — Z79899 Other long term (current) drug therapy: Secondary | ICD-10-CM | POA: Diagnosis not present

## 2015-08-26 DIAGNOSIS — I2510 Atherosclerotic heart disease of native coronary artery without angina pectoris: Secondary | ICD-10-CM

## 2015-08-26 DIAGNOSIS — I214 Non-ST elevation (NSTEMI) myocardial infarction: Secondary | ICD-10-CM

## 2015-08-26 DIAGNOSIS — E785 Hyperlipidemia, unspecified: Secondary | ICD-10-CM

## 2015-08-26 MED ORDER — CARVEDILOL 3.125 MG PO TABS: 3.1250 mg | ORAL_TABLET | Freq: Two times a day (BID) | ORAL | Status: DC

## 2015-08-26 MED ORDER — ATORVASTATIN CALCIUM 40 MG PO TABS: 40.0000 mg | ORAL_TABLET | Freq: Every day | ORAL | Status: DC

## 2015-08-26 NOTE — Progress Notes (Signed)
PATIENTPer Howe MRN: [id number] DOB: April 06, 1956 PCP: No PCP Roger Patient  Clinic Note: Chief Complaint  Patient presents with  . Follow-up    Patient has had chest pain/pressure.  . Coronary Artery Disease  . Hypertension  . Hyperlipidemia  History performed with the assistance of [name] (Financial controller)  HPI: Roger Howe is a 59 y.o. male with a PMHx of NSTEMI -->CAD status post CABG in 5/15, HTN, HL.  below who presents essentially re-establishing care with me.   I last saw him in June 2015 (shortly following his CABG) & he was essentially lost to f/u (due to lack of medical insurance -- had essentially been off of all meds).  He did have an echo done in Sept 2015 that showed improved EF & overall function.   5/15 with a non-STEMI.   LHC demonstrated 3 vessel disease.   Echocardiogram demonstrated an EF of 40-45% with diffuse hypokinesis.   He underwent CABG 4 with Dr. [name] (LIMA-LAD, SVG-PDA, SVG-RPL2, SVG-DX).   Last seen by Dr. [name] 05/2014.   Echocardiogram in 9/15 demonstrated improved LV function with an EF of 55-60%. (There was mild concentric hypertrophy. No residual WMA -- diastolic indices read as normal, but not likely given LVH & HTN)  He recently saw Dr. [name] 05/16/15. The patient was off all of his medications due to financial reasons. Blood pressure was 193/120. His medications were resumed. He has subsequently seen [name], PA for 2 visits - 5/24 & 6/22 --> still notes having incisional pain from the sternotomy (a burning Sx that he notes at the end of the day after hard work - is reproducible on exam).  He also notes SVG harvest site incisional pain - mostly his R lower leg aches @ rest, better with walking).  Blood pressure medications were adjusted on. He did note some exertional dyspnea but is able to walk about 30 min as apposed to ~1 hr without stopping.  Obviously worsened heat. Otherwise was asymptomatic from cardiac standpoint.  He  now apparently has Medicaid coverage and is able to get back on his medications.  Interval History: the patient is a very difficult to get a full short from. He seems to get across the impression that he definitely feels a lot better than he had felt prior to his MI and CABG. He certainly feels better as far as exertional dyspnea and fatigue than he did shortly after his CABG and MI. He is obviously active and may have a slight amount of exercise intolerance but that may have been more related to hypertension is poorly controlled. Currently on current medications, he is not noticing much the dyspnea on exertion.  Outline through the list of medications with him and it still seems that he has not been on Lipitor since last October and just had some blood work done showing poorly controlled lipids. He also has not been on his beta blocker either. He is simply taking aspirin lisinopril and HCTZ.   He still describes what sounds like incisional pain along the left side of his chest that is reproducible on exam. He describes a burning along his rib cage on, it's worse when he is high and when he is exerting himself. Otherwise he really denies exertional chest tightness or pressure. He does have some mild exertional dyspnea as described not worse than before. No PND, orthopnea or edema. No rapid/irregular heartbeat/palpitations. No CP/near syncope or TIA /amaurosis fugax symptoms.   Past Medical History  Diagnosis Date  .  Essential hypertension   . NSTEMI (non-ST elevated myocardial infarction) 05/17/2014    Post CABG Echo 09/19/2014: Normal EF 55-60% with mild Conc LVH. No regional WMA, essentially normal  . 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 Abdominal aortic atherosclerosis     Seen on catheterization    Prior Cardiac Evaluation and Past Surgical History: Past Surgical History  Procedure  Laterality Date  . Cardiac catheterization  05/17/2014    DR [name]: 95% proximal LAD, 90% ramus intermedius, 95% mid circumflex after OM1, neither percent RPDA  . Coronary artery bypass graft N/A 05/20/2014    Procedure: CORONARY ARTERY BYPASS GRAFTING (CABG) x 4,  LIMA-MID LAD,  SVG-DIAGONAL,  SEQUENTIAL SVG -PDA, PLB #2;  Surgeon: [name], MD;  Location: [facility] OR;  Service: Open Heart Surgery;  Laterality: N/A;  . Intraoperative transesophageal echocardiogram N/A 05/20/2014    Procedure: INTRAOPERATIVE TRANSESOPHAGEAL ECHOCARDIOGRAM;  Surgeon: [name], MD;  Location: [facility] OR;  Service: Open Heart Surgery;  Laterality: N/A;  . Transthoracic echocardiogram  09/14/2014    Normal EF 55-60% with mild Conc LVH. No regional WMA, essentially normal  . Left heart catheterization with coronary angiogram N/A 05/18/2014    Procedure: LEFT HEART CATHETERIZATION WITH CORONARY ANGIOGRAM;  Surgeon: [name], MD;  Location[facility] CATH LAB;  Service: Cardiovascular;  Laterality: N/A;    No Known Allergies  Current Outpatient Prescriptions  Medication Sig Dispense Refill  . aspirin EC 81 MG tablet Take 1 tablet (81 mg total) by mouth daily. 90 tablet 3  . hydrochlorothiazide (MICROZIDE) 12.5 MG capsule Take 12.5 mg by mouth daily.  11  . lisinopril-hydrochlorothiazide (ZESTORETIC) 10-12.5 MG Roger tablet Take 1 tablet by mouth daily. 90 tablet 3  . atorvastatin (LIPITOR) 40 MG tablet Take 1 tablet (40 mg total) by mouth daily. 90 tablet 3  . carvedilol (COREG) 3.125 MG tablet Take 1 tablet (3.125 mg total) by mouth 2 (two) times daily. 180 tablet 3   No current facility-administered medications for this visit.    Social History   Social History Narrative   [place] native - needs an interpreter. Married with 5 children and 4 grandchildren.   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    Never smoked.    family history includes Heart  disease in his brother. There is no history of Heart attack or Stroke.  ROS: A comprehensive Review of Systems - was performed Constitution: Positive for decreased appetite, diaphoresis and malaise/fatigue.  Cardiovascular: Positive for chest pain and dyspnea on exertion.  Respiratory: Positive for snoring.  Musculoskeletal: Positive for joint pain.  Neurological: Positive for loss of balance.  All other systems reviewed and are negative.   Wt Readings from Last 3 Encounters:  08/26/15 200 lb (90.719 kg)  08/03/15 201 lb (91.173 kg)  06/22/15 201 lb (91.173 kg)    PHYSICAL EXAM BP 158/106 mmHg  Pulse 85  Ht 5\' 7"  (1.702 m)  Wt 200 lb (90.719 kg)  BMI 31.32 kg/m2 General appearance: alert, cooperative, appears stated age, no distress and healthy appearing  Neck: no adenopathy, no carotid bruit, no JVD, supple, symmetrical, trachea midline and thyroid not enlarged, symmetric, no tenderness/mass/nodules Lungs: clear to auscultation bilaterally, normal percussion bilaterally and Nonlabored, good air movement Heart: RRR,  normal S1-S2. No M./R./G. Nondisplaced PMI. Normal,  Reproducible chest pain along the left rib cage -- notes that this is the "burning" sensation that he feels Abdomen: Soft, mildly tender. Somewhat diminished bowel sounds. No HSM Extremities: extremities normal, atraumatic, no cyanosis or edema Pulses: 2+ and symmetric Neurologic: Alert and oriented X 3, normal strength and tone. Normal symmetric reflexes. Normal coordination and gait   Adult ECG Report --n/a  Recent Labs:  Lab Results  Component Value Date   CHOL 214* 07/19/2015   HDL 47.30 07/19/2015   LDLCALC 72 08/25/2014   LDLDIRECT 118.0 07/19/2015   TRIG 299.0* 07/19/2015   CHOLHDL 5 07/19/2015    ASSESSMENT / PLAN: Very difficult clinic visits home to use an interpreter. I don't get a sensitive use overly symptomatic. He is not having any symptoms that he states is anything like his MI related  anginal pain. He totally denies that. He is not having that with rest or exertion. He is noticing is probably more related to incisional pains. As far as exertional dyspnea he is still very active, and still hypertensive. Despite likely diastolic dysfunction on despite improved EF making him susceptible to elevated pressures. He simply needs better blood pressure control and overall reestablishing adequate cardiac care.  Problem List Items Addressed This Visit    Abdominal aortic atherosclerosis (Chronic)    Needs to restart statin. Otherwise aggressive cardiac risk factor management. No sign of aneurysm.      Relevant Medications   hydrochlorothiazide (MICROZIDE) 12.5 MG capsule   carvedilol (COREG) 3.125 MG tablet   atorvastatin (LIPITOR) 40 MG tablet   Other Relevant Orders   Lipid panel   Comprehensive metabolic panel   Coronary artery disease, occlusive (Chronic)    Multivessel disease requiring CABG. Does not seem to be having anginal symptoms but may be having some diastolic heart failure symptoms. Restart beta blocker and statin. Continue ACE inhibitor/HCTZ.      Relevant Medications   hydrochlorothiazide (MICROZIDE) 12.5 MG capsule   carvedilol (COREG) 3.125 MG tablet   atorvastatin (LIPITOR) 40 MG tablet   Other Relevant Orders   Lipid panel   Comprehensive metabolic panel   Dyslipidemia, goal LDL below 70 (Chronic)    LDL actually seemed to be pretty good last year, now not controlled as of July labs. Triglycerides are also elevated. As the triglycerides are not dramatically elevated, we will simply restart statin and 40 mg daily. Recheck labs in 3 months, we can check the chemistry panel at the time of lipids/LFTs.      Relevant Medications   hydrochlorothiazide (MICROZIDE) 12.5 MG capsule   carvedilol (COREG) 3.125 MG tablet   atorvastatin (LIPITOR) 40 MG tablet   Other Relevant Orders   Lipid panel   Comprehensive metabolic panel   Essential hypertension (Chronic)      Poorly controlled. He had history of accelerated hypertension when he presented with his MI. He was supposed to be on metoprolol but that has not been continued. He is on ACE inhibitor plus HCTZ.    Add carvedilol to 3.125 mg twice a day with hopes to up titrate.  As he is on ACE inhibitor and HCTZ vitamins recently restarted, he will need to have chemistries to evaluate renal function and potassium.      Relevant Medications   hydrochlorothiazide (MICROZIDE) 12.5 MG capsule   carvedilol (COREG) 3.125 MG tablet   atorvastatin (LIPITOR) 40 MG tablet   Other Relevant Orders   Lipid panel   Comprehensive metabolic  panel   NSTEMI (non-ST elevated myocardial infarction) (Chronic)    He does not mention symptoms of his MRI type anginal pain. Please get back on his current medications. He is taking ACE inhibitor and HCTZ  Will restart beta blocker and statin.      Relevant Medications   hydrochlorothiazide (MICROZIDE) 12.5 MG capsule   carvedilol (COREG) 3.125 MG tablet   atorvastatin (LIPITOR) 40 MG tablet   S/P CABG x 4   Relevant Orders   Lipid panel   Comprehensive metabolic panel    Other Visit Diagnoses    Drug therapy    -  Primary    Relevant Orders    Lipid panel    Comprehensive metabolic panel       Meds ordered this encounter  Medications  . hydrochlorothiazide (MICROZIDE) 12.5 MG capsule    Sig: Take 12.5 mg by mouth daily.    Refill:  11  . carvedilol (COREG) 3.125 MG tablet    Sig: Take 1 tablet (3.125 mg total) by mouth 2 (two) times daily.    Dispense:  180 tablet    Refill:  3  . atorvastatin (LIPITOR) 40 MG tablet    Sig: Take 1 tablet (40 mg total) by mouth daily.    Dispense:  90 tablet    Refill:  3   START TAKING CARVEDILOL 3.125 MG ONE TABLET TWICE A DAY  RESTART TAKING ATORVASTATIN DAILY  PLEASE HAVE LABS CHECKED IN 3 -4 MONTHS , WILL MAIL YOU LAB SLIP TO YOU.  Your physician recommends that you schedule a follow-up appointment in  3  MONTHS WITH KRISTIN (BLOOD PRESSURE CHECK)   Your physician wants you to follow-up in  JAN /FEB 2017 WITH DR Shantara Goosby- 30 MIN APPOINT MENT - NEED INTERRUPTER   Lakasha Mcfall W. [name], M.D., M.S. Interventional Cardiolgy [facility]

## 2015-08-26 NOTE — Sleep Study (Signed)
NAME: Roger Howe DATE OF BIRTH:  01-14-1956 MEDICAL RECORD NUMBER [id number]  LOCATION: [facility]  PHYSICIAN: Falisha Osment A  DATE OF STUDY: 08/03/2015  SLEEP STUDY TYPE: Nocturnal Polysomnogram               REFERRING PHYSICIAN: Beatrice Lecher, PA-C   Patient Name: Roger Howe, Roger Howe Study Date: 08/03/2015 Gender: Male D.O.B: 12-15-1956 Age (years): 70 Referring Provider: [name] Height (inches): 67 Interpreting Physician: Nicki Guadalajara MD, ABSM Weight (lbs): 201 RPSGT: Shelah Lewandowsky BMI: 31 MRN: [id number] Neck Size: 16.50 CLINICAL INFORMATION Sleep Study Type: NPSG  Indication for sleep study: Excessive Daytime Sleepiness; snoring  Epworth Sleepiness Score: 0  SLEEP STUDY TECHNIQUE As per the AASM Manual for the Scoring of Sleep and Associated Events v2.3 (April 2016) with a hypopnea requiring 4% desaturations.  The channels recorded and monitored were frontal, central and occipital EEG, electrooculogram (EOG), submentalis EMG (chin), nasal and oral airflow, thoracic and abdominal wall motion, anterior tibialis EMG, snore microphone, electrocardiogram, and pulse oximetry.  MEDICATIONS Patient's medications include: aspirin EC 81 MG tablet 81 mg, Daily  Note: PT STATES HE HAS NOT STARTED ON 81 MG YET, ONCE HE FINISHES THE 325 MG BOTTLE HE WILL START 81 MG (Written 06/22/2015 1436)  atorvastatin (LIPITOR) 40 MG tablet 40 mg, Daily  carvedilol (COREG) 3.125 MG tablet 3.125 mg, 2 times daily  hydrochlorothiazide (MICROZIDE) 12.5 MG capsule 12.5 mg, Daily  Note: Received from: External Pharmacy (Written 08/26/2015 0955)  lisinopril   Medications self-administered by patient during sleep study : No sleep medicine administered.  SLEEP ARCHITECTURE The study was initiated at 11:18:45 PM and ended at 5:07:29 AM.  Sleep onset time was 56.2 minutes and the sleep efficiency was 61.7%. The total sleep time was 215.0 minutes.  Stage REM latency was  153.0 minutes.  Wake after sleep onset (WASO): 77.5 minutes  The patient spent 10.00% of the night in stage N1 sleep, 63.95% in stage N2 sleep, 6.74% in stage N3 and 19.30% in REM.  Alpha intrusion was absent.  Supine sleep was 0.00%.  RESPIRATORY PARAMETERS The overall apnea/hypopnea index (AHI) was 0.0 per hour. There were 0 total apneas, including 0 obstructive, 0 central and 0 mixed apneas. There were 0 hypopneas and 8 RERAs.  The AHI during Stage REM sleep was 0.0 per hour.  AHI while supine was N/A per hour.  The mean oxygen saturation was 95.66%. The minimum SpO2 during sleep was 92.00%.  Moderate snoring was noted during this study.  CARDIAC DATA The 2 lead EKG demonstrated sinus rhythm. The mean heart rate was 76.83 beats per minute. Other EKG findings include: PVCs.  LEG MOVEMENT DATA The total PLMS were 108 with a resulting PLMS index of 30.14. Associated arousal with leg movement index was 3.9 .  IMPRESSIONS No significant obstructive sleep apnea occurred during this study (AHI = 0.0/h), however, there was absence of supine sleep. No significant central sleep apnea occurred during this study (CAI = 0.0/h). Reduced sleep efficiency at 61.7% and prolonged latency to REM sleep. The patient had minimal or no oxygen desaturation during the study (Min O2 = 92.00%). The arousal index was increased. The patient snored with Moderate snoring volume. EKG findings include PVCs. Moderate periodic limb movements of sleep with an PLMS index of 30.14. No significant associated arousals.  DIAGNOSIS Periodic Limb movement disorder of sleep. Moderate snoring.  RECOMMENDATIONS No indication for CPAP therapy. Efforts should be made to optimize nasal and oropharyngeal patency; consider  alternatives for the treatmnet of snoring. Avoid alcohol, sedatives and other CNS depressants that may worsen sleep apnea and disrupt normal sleep architecture. If patient is symptomatic with  restless legs, consider a trial of requip with an PLMS index of 30.14. Sleep hygiene should be reviewed to assess factors that may improve sleep quality. Weight management and regular exercise should be initiated or continued if appropriate.   Lennette Bihari Diplomate, American Board of Sleep Medicine  ELECTRONICALLY SIGNED ON:  08/26/2015, 3:59 PM [facility] PH: [phone]   FX: [phone] ACCREDITED BY THE AMERICAN ACADEMY OF SLEEP MEDICINE

## 2015-08-26 NOTE — Patient Instructions (Signed)
START TAKING CARVEDILOL 3.125 MG ONE TABLET TWICE A DAY  RESTART TAKING ATORVASTATIN DAILY  PLEASE HAVE LABS CHECKED IN 3 -4 MONTHS , WILL MAIL YOU LAB SLIP TO YOU.  Your physician recommends that you schedule a follow-up appointment in  3 MONTHS WITH KRISTIN (BLOOD PRESSURE CHECK)   Your physician wants you to follow-up in  JAN /FEB 2017 WITH DR HARDING- 30 MIN APPOINT MENT - NEED INTERRUPTER You will receive a reminder letter in the mail two months in advance. If you don't receive a letter, please call our office to schedule the follow-up appointment.

## 2015-08-28 ENCOUNTER — Encounter: Payer: Self-pay | Admitting: Cardiology

## 2015-08-28 NOTE — Assessment & Plan Note (Signed)
Poorly controlled. He had history of accelerated hypertension when he presented with his MI. He was supposed to be on metoprolol but that has not been continued. He is on ACE inhibitor plus HCTZ.    Add carvedilol to 3.125 mg twice a day with hopes to up titrate.  As he is on ACE inhibitor and HCTZ vitamins recently restarted, he will need to have chemistries to evaluate renal function and potassium.

## 2015-08-28 NOTE — Assessment & Plan Note (Signed)
LDL actually seemed to be pretty good last year, now not controlled as of July labs. Triglycerides are also elevated. As the triglycerides are not dramatically elevated, we will simply restart statin and 40 mg daily. Recheck labs in 3 months, we can check the chemistry panel at the time of lipids/LFTs.

## 2015-08-28 NOTE — Assessment & Plan Note (Addendum)
He does not mention symptoms of his MRI type anginal pain. Please get back on his current medications. He is taking ACE inhibitor and HCTZ  Will restart beta blocker and statin.

## 2015-08-28 NOTE — Assessment & Plan Note (Signed)
Multivessel disease requiring CABG. Does not seem to be having anginal symptoms but may be having some diastolic heart failure symptoms. Restart beta blocker and statin. Continue ACE inhibitor/HCTZ.

## 2015-08-28 NOTE — Assessment & Plan Note (Signed)
Needs to restart statin. Otherwise aggressive cardiac risk factor management. No sign of aneurysm.

## 2015-08-30 ENCOUNTER — Telehealth: Payer: Self-pay | Admitting: Physician Assistant

## 2015-08-30 NOTE — Telephone Encounter (Signed)
Please notify patient: Sleep study negative for sleep apnea. There was periodic limb movement noted.  Sleep study recommendations: "No indication for CPAP therapy. Efforts should be made to optimize nasal and oropharyngeal patency; consider alternatives for the treatmnet of snoring.  >> FU with PCP to discuss and send copy of sleep study report. Avoid alcohol, sedatives and other CNS depressants that may worsen sleep apnea and disrupt normal sleep architecture. If patient is symptomatic with restless legs, consider a trial of requip >> FU with PCP to review symptoms and determine if this drug is appropriate. Sleep hygiene should be reviewed to assess factors that may improve sleep quality. >> FU with PCP as noted above. Weight management and regular exercise should be initiated or continued"  [name], PA-C   08/30/2015 4:58 PM

## 2015-08-30 NOTE — Telephone Encounter (Signed)
DPR s/w nephew [name]. He has been notified of sleep study results for pt and will let pt know the results as well as recommended to f/u w/PCP about possible restless leg.

## 2015-09-01 ENCOUNTER — Encounter: Payer: Self-pay | Admitting: Cardiovascular Disease

## 2016-05-30 ENCOUNTER — Other Ambulatory Visit: Payer: Self-pay | Admitting: Cardiology

## 2016-05-30 ENCOUNTER — Other Ambulatory Visit: Payer: Self-pay | Admitting: Physician Assistant

## 2016-05-30 NOTE — Telephone Encounter (Signed)
Rx(s) sent to pharmacy electronically.  

## 2016-07-30 ENCOUNTER — Other Ambulatory Visit: Payer: Self-pay | Admitting: Cardiology

## 2016-08-03 ENCOUNTER — Other Ambulatory Visit: Payer: Self-pay | Admitting: Cardiology

## 2016-08-14 ENCOUNTER — Other Ambulatory Visit: Payer: Self-pay | Admitting: Cardiology

## 2016-08-14 NOTE — Telephone Encounter (Signed)
For more refills, please contact the office and schedule a follow appointment [phone].

## 2016-08-15 ENCOUNTER — Other Ambulatory Visit: Payer: Self-pay | Admitting: Cardiology

## 2016-08-15 NOTE — Telephone Encounter (Signed)
Medication Detail    Disp Refills Start End   carvedilol (COREG) 3.125 MG tablet 30 tablet 0 08/14/2016    Sig - Route: Take 1 tablet (3.125 mg total) by mouth 2 (two) times daily. For more refills, please contact the office and schedule a follow appointment [phone]. - Oral   Class: Print   Pharmacy   [id number] - [place], [place] - [address]

## 2016-08-24 ENCOUNTER — Other Ambulatory Visit: Payer: Self-pay | Admitting: Cardiology

## 2016-08-27 NOTE — Telephone Encounter (Signed)
Rx request sent to pharmacy.  

## 2016-09-02 ENCOUNTER — Other Ambulatory Visit: Payer: Self-pay | Admitting: Cardiology

## 2016-09-04 ENCOUNTER — Other Ambulatory Visit: Payer: Self-pay | Admitting: Cardiology

## 2016-09-04 NOTE — Telephone Encounter (Signed)
Hctz, carvedilol already refilled

## 2016-09-04 NOTE — Telephone Encounter (Signed)
Rx(s) sent to pharmacy electronically.  

## 2016-09-06 ENCOUNTER — Telehealth: Payer: Self-pay | Admitting: Cardiology

## 2016-09-06 NOTE — Telephone Encounter (Signed)
Tried to leave a message but the voicemail has not been set up yet.

## 2016-09-24 ENCOUNTER — Other Ambulatory Visit: Payer: Self-pay | Admitting: Cardiology

## 2016-09-24 NOTE — Telephone Encounter (Signed)
Rx request sent to pharmacy.  

## 2016-09-25 ENCOUNTER — Other Ambulatory Visit: Payer: Self-pay | Admitting: Cardiology

## 2016-10-26 ENCOUNTER — Other Ambulatory Visit: Payer: Self-pay | Admitting: Cardiology

## 2016-11-06 ENCOUNTER — Encounter: Payer: Self-pay | Admitting: Cardiology

## 2016-11-08 ENCOUNTER — Other Ambulatory Visit: Payer: Self-pay | Admitting: Cardiology

## 2016-11-09 ENCOUNTER — Encounter: Payer: Self-pay | Admitting: *Deleted

## 2016-11-09 ENCOUNTER — Ambulatory Visit: Payer: Medicaid Other | Admitting: Cardiology

## 2016-11-09 NOTE — Progress Notes (Deleted)
PCP: No PCP Per Patient  Clinic Note: No chief complaint on file.   HPI: Roger Howe is a 60 y.o. male with a PMH below who presents today for delayed annual follow-up of his CAD. In May 2015 he suffered a non-ST elevation MI and an appointment for CABG.  He was seen in June 2015 following CABG and then not again until August 2016. He did have a follow-up echo in September 2015 showing improved EF.  5/15 with a non-STEMI.  ? LHC demonstrated 3 vessel disease.  ? Echocardiogram demonstrated an EF of 40-45% with diffuse hypokinesis.  ? He underwent CABG 4 with Dr. Cornelius Moraswen (LIMA-LAD, SVG-PDA, SVG-RPL2, SVG-DX).   Last seen by Dr. Herbie BaltimoreHarding 05/2014.   Echocardiogram in 9/15 demonstrated improved LV function with an EF of 55-60%. (There was mild concentric hypertrophy. No residual WMA -- diastolic indices read as normal, but not likely given LVH & HTN)  Roger Howe was last seen on 08/26/2015. It was noted that when he had seen Dr. Cornelius Moraswen from CT surgery, he was off all of his medications in May 2016. Subsequent saw Tereso NewcomerScott Weaver, [place] in May and June 2016 and had medications restarted. He noted chest wall discomfort from sternal wires as well as vein graft harvest site pain.  Recent Hospitalizations: None  Studies Reviewed: None  Interval History: ***   No chest pain or shortness of breath with rest or exertion. No PND, orthopnea or edema. No palpitations, lightheadedness, dizziness, weakness or syncope/near syncope. No TIA/amaurosis fugax symptoms. No melena, hematochezia, hematuria, or epstaxis. No claudication.  No flowsheet data found.    ROS: A comprehensive was performed. ROS Constitution: Positive for decreased appetite, diaphoresis and malaise/fatigue.  Cardiovascular: Positive for chest pain and dyspnea on exertion.  Respiratory: Positive for snoring.  Musculoskeletal: Positive for joint pain.  Neurological: Positive for loss of balance.   Past Medical History:  Diagnosis  Date  . Abdominal aortic atherosclerosis (HCC)    Seen on catheterization  . 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 Essential hypertension   . NSTEMI (non-ST elevated myocardial infarction) (HCC) 05/17/2014   Post CABG Echo 09/19/2014: Normal EF 55-60% with mild Conc LVH. No regional WMA, essentially normal  . 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 CARDIAC CATHETERIZATION  05/17/2014   DR HARDING: 95% proximal LAD, 90% ramus intermedius, 95% mid circumflex after OM1, neither percent RPDA  . CORONARY ARTERY BYPASS GRAFT N/A 05/20/2014   Procedure: CORONARY ARTERY BYPASS GRAFTING (CABG) x 4,  LIMA-MID LAD,  SVG-DIAGONAL,  SEQUENTIAL SVG -PDA, PLB #2;  Surgeon: Purcell Nailslarence H Owen, MD;  Location: [facility] OR;  Service: Open Heart Surgery;  Laterality: N/A;  . INTRAOPERATIVE TRANSESOPHAGEAL ECHOCARDIOGRAM N/A 05/20/2014   Procedure: INTRAOPERATIVE TRANSESOPHAGEAL ECHOCARDIOGRAM;  Surgeon: Purcell Nailslarence H Owen, MD;  [facility];  Service: Open Heart Surgery;  Laterality: N/A;  . LEFT HEART CATHETERIZATION WITH CORONARY ANGIOGRAM N/A 05/18/2014   Procedure: LEFT HEART CATHETERIZATION WITH CORONARY ANGIOGRAM;  Surgeon: Marykay Lexavid W Harding, MD;  [facility];  Service: Cardiovascular;  Laterality: N/A;  . TRANSTHORACIC ECHOCARDIOGRAM  09/14/2014   Normal EF 55-60% with mild Conc LVH. No regional WMA, essentially normal   *** med No Known Allergies  Social History   Social History  . Marital status: Married    Spouse name: N/A  .  Number of children: N/A  . Years of education: N/A   Social History Main Topics  . Smoking status: Never Smoker  . Smokeless tobacco: Never Used  . Alcohol use No  . Drug use: No  . Sexual activity: Not on file   Other Topics Concern  . Not on file   Social History Narrative   [place] - needs an interpreter. Married with 5 children and  4 grandchildren.   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    Never smoked.    Family History  Problem Relation Age of Onset  . Heart disease Brother     lives in [place]  . Heart attack Neg Hx   . Stroke Neg Hx      Wt Readings from Last 3 Encounters:  08/26/15 90.7 kg (200 lb)  08/03/15 91.2 kg (201 lb)  06/22/15 91.2 kg (201 lb)    PHYSICAL EXAM There were no vitals taken for this visit. General appearance: alert, cooperative, appears stated age, no distress and healthy appearing  Neck: no adenopathy, no carotid bruit, no JVD, supple, symmetrical, trachea midline and thyroid not enlarged, symmetric, no tenderness/mass/nodules Lungs: clear to auscultation bilaterally, normal percussion bilaterally and Nonlabored, good air movement Heart: RRR, normal S1-S2. No M./R./G. Nondisplaced PMI. Normal,  Reproducible chest pain along the left rib cage -- notes that this is the "burning" sensation that he feels Abdomen: Soft, mildly tender. Somewhat diminished bowel sounds. No HSM Extremities: extremities normal, atraumatic, no cyanosis or edema Pulses: 2+ and symmetric Neurologic: Alert and oriented X 3, normal strength and tone. Normal symmetric reflexes. Normal coordination and gait    Adult ECG Report  Rate: *** ;  Rhythm: {rhythm:17366};   Narrative Interpretation: ***   Other studies Reviewed: Additional studies/ records that were reviewed today include:  Recent Labs:  ***     ASSESSMENT / PLAN: Problem List Items Addressed This Visit    NSTEMI (non-ST elevated myocardial infarction) (HCC) - Primary (Chronic)   Essential hypertension (Chronic)   Abdominal aortic atherosclerosis (HCC) (Chronic)   Coronary artery disease, occlusive (Chronic)   Dyslipidemia, goal LDL below 70 (Chronic)   S/P CABG x 4      Current medicines are reviewed at length with the patient today. (+/- concerns) *** The following changes have been  made: ***  There are no Patient Instructions on file for this visit.  Studies Ordered:   No orders of the defined types were placed in this encounter.     Bryan Lemma, M.D., M.S. Interventional Cardiologist   Pager # [phone] Phone # [phone] [address] [place], [place] [address]

## 2017-03-09 ENCOUNTER — Other Ambulatory Visit: Payer: Self-pay | Admitting: Cardiology

## 2019-08-09 ENCOUNTER — Inpatient Hospital Stay (HOSPITAL_COMMUNITY)
Admission: EM | Admit: 2019-08-09 | Discharge: 2019-08-12 | DRG: 177 | Disposition: A | Discharge status: Home Health | Payer: Medicare Other | Attending: Internal Medicine | Admitting: Internal Medicine

## 2019-08-09 ENCOUNTER — Encounter (HOSPITAL_COMMUNITY): Payer: Self-pay

## 2019-08-09 ENCOUNTER — Emergency Department (HOSPITAL_COMMUNITY): Payer: Medicare Other

## 2019-08-09 ENCOUNTER — Other Ambulatory Visit: Payer: Self-pay

## 2019-08-09 DIAGNOSIS — Z951 Presence of aortocoronary bypass graft: Secondary | ICD-10-CM | POA: Diagnosis not present

## 2019-08-09 DIAGNOSIS — I2510 Atherosclerotic heart disease of native coronary artery without angina pectoris: Secondary | ICD-10-CM

## 2019-08-09 DIAGNOSIS — R0602 Shortness of breath: Secondary | ICD-10-CM

## 2019-08-09 DIAGNOSIS — I10 Essential (primary) hypertension: Secondary | ICD-10-CM | POA: Diagnosis present

## 2019-08-09 DIAGNOSIS — J1289 Other viral pneumonia: Secondary | ICD-10-CM | POA: Diagnosis present

## 2019-08-09 DIAGNOSIS — U071 COVID-19: Secondary | ICD-10-CM | POA: Diagnosis present

## 2019-08-09 DIAGNOSIS — E876 Hypokalemia: Secondary | ICD-10-CM

## 2019-08-09 DIAGNOSIS — J988 Other specified respiratory disorders: Secondary | ICD-10-CM | POA: Diagnosis not present

## 2019-08-09 LAB — COMPREHENSIVE METABOLIC PANEL
ALT: 21 U/L (ref 0–44)
AST: 30 U/L (ref 15–41)
Albumin: 3.1 g/dL — ABNORMAL LOW (ref 3.5–5.0)
Alkaline Phosphatase: 51 U/L (ref 38–126)
Anion gap: 10 (ref 5–15)
BUN: 12 mg/dL (ref 8–23)
CO2: 22 mmol/L (ref 22–32)
Calcium: 8.5 mg/dL — ABNORMAL LOW (ref 8.9–10.3)
Chloride: 104 mmol/L (ref 98–111)
Creatinine, Ser: 0.76 mg/dL (ref 0.61–1.24)
GFR calc Af Amer: >60 mL/min (ref >60)
GFR calc non Af Amer: >60 mL/min (ref >60)
Glucose, Bld: 106 mg/dL — ABNORMAL HIGH (ref 70–99)
Potassium: 3.3 mmol/L — ABNORMAL LOW (ref 3.5–5.1)
Sodium: 136 mmol/L (ref 135–145)
Total Bilirubin: 0.9 mg/dL (ref 0.3–1.2)
Total Protein: 7.5 g/dL (ref 6.5–8.1)

## 2019-08-09 LAB — CBC WITH DIFFERENTIAL/PLATELET
Abs Immature Granulocytes: 0.04 10*3/uL (ref 0.00–0.07)
Basophils Absolute: 0 10*3/uL (ref 0.0–0.1)
Basophils Relative: 0 %
Eosinophils Absolute: 0.1 10*3/uL (ref 0.0–0.5)
Eosinophils Relative: 1 %
HCT: 37.9 % — ABNORMAL LOW (ref 39.0–52.0)
Hemoglobin: 12.7 g/dL — ABNORMAL LOW (ref 13.0–17.0)
Immature Granulocytes: 0 %
Lymphocytes Relative: 19 %
Lymphs Abs: 1.7 10*3/uL (ref 0.7–4.0)
MCH: 27.6 pg (ref 26.0–34.0)
MCHC: 33.5 g/dL (ref 30.0–36.0)
MCV: 82.4 fL (ref 80.0–100.0)
Monocytes Absolute: 1.4 10*3/uL — ABNORMAL HIGH (ref 0.1–1.0)
Monocytes Relative: 15 %
Neutro Abs: 5.9 10*3/uL (ref 1.7–7.7)
Neutrophils Relative %: 65 %
Platelets: 284 10*3/uL (ref 150–400)
RBC: 4.6 MIL/uL (ref 4.22–5.81)
RDW: 13.2 % (ref 11.5–15.5)
WBC: 9.2 10*3/uL (ref 4.0–10.5)
nRBC: 0 % (ref 0.0–0.2)

## 2019-08-09 LAB — LACTATE DEHYDROGENASE: LDH: 232 U/L — ABNORMAL HIGH (ref 98–192)

## 2019-08-09 LAB — LACTIC ACID, PLASMA: Lactic Acid, Venous: 1.1 mmol/L (ref 0.5–1.9)

## 2019-08-09 LAB — C-REACTIVE PROTEIN: CRP: 11.4 mg/dL — ABNORMAL HIGH (ref <1.0)

## 2019-08-09 LAB — SEDIMENTATION RATE: Sed Rate: 73 mm/hr — ABNORMAL HIGH (ref 0–16)

## 2019-08-09 LAB — PROCALCITONIN: Procalcitonin: <0.1 ng/mL

## 2019-08-09 LAB — D-DIMER, QUANTITATIVE: D-Dimer, Quant: 2.08 ug/mL-FEU — ABNORMAL HIGH (ref 0.00–0.50)

## 2019-08-09 LAB — TROPONIN I (HIGH SENSITIVITY): Troponin I (High Sensitivity): 6 ng/L (ref <18)

## 2019-08-09 LAB — ABO/RH: ABO/RH(D): B POS

## 2019-08-09 LAB — SARS CORONAVIRUS 2 BY RT PCR (HOSPITAL ORDER, PERFORMED IN ~~LOC~~ HOSPITAL LAB): SARS Coronavirus 2: POSITIVE — AB

## 2019-08-09 LAB — FERRITIN: Ferritin: 360 ng/mL — ABNORMAL HIGH (ref 24–336)

## 2019-08-09 LAB — TRIGLYCERIDES: Triglycerides: 97 mg/dL (ref <150)

## 2019-08-09 MED ORDER — POTASSIUM CHLORIDE CRYS ER 20 MEQ PO TBCR
40.0000 meq | EXTENDED_RELEASE_TABLET | Freq: Once | ORAL | Status: AC
  Administered 2019-08-09: 17:00:00 40 meq via ORAL
  Filled 2019-08-09: qty 2

## 2019-08-09 MED ORDER — BENZONATATE 100 MG PO CAPS
200.0000 mg | ORAL_CAPSULE | Freq: Three times a day (TID) | ORAL | Status: DC
  Administered 2019-08-09 – 2019-08-12 (×9): 200 mg via ORAL
  Filled 2019-08-09 (×10): qty 2

## 2019-08-09 MED ORDER — METHYLPREDNISOLONE SODIUM SUCC 40 MG IJ SOLR
40.0000 mg | Freq: Two times a day (BID) | INTRAMUSCULAR | Status: DC
  Administered 2019-08-09 – 2019-08-12 (×6): 40 mg via INTRAVENOUS
  Filled 2019-08-09 (×6): qty 1

## 2019-08-09 MED ORDER — DEXAMETHASONE SODIUM PHOSPHATE 10 MG/ML IJ SOLN: 6.0000 mg | Freq: Once | INTRAMUSCULAR | Status: DC

## 2019-08-09 MED ORDER — ACETAMINOPHEN 325 MG PO TABS: 650.0000 mg | ORAL_TABLET | Freq: Four times a day (QID) | ORAL | Status: DC | PRN

## 2019-08-09 MED ORDER — SODIUM CHLORIDE 0.9 % IV BOLUS
500.0000 mL | Freq: Once | INTRAVENOUS | Status: AC
  Administered 2019-08-09: 500 mL via INTRAVENOUS

## 2019-08-09 MED ORDER — ONDANSETRON HCL 4 MG/2ML IJ SOLN: 4.0000 mg | Freq: Four times a day (QID) | INTRAMUSCULAR | Status: DC | PRN

## 2019-08-09 MED ORDER — ALBUTEROL SULFATE HFA 108 (90 BASE) MCG/ACT IN AERS
1.0000 | INHALATION_SPRAY | RESPIRATORY_TRACT | Status: DC | PRN
  Filled 2019-08-09: qty 6.7

## 2019-08-09 MED ORDER — ENOXAPARIN SODIUM 40 MG/0.4ML ~~LOC~~ SOLN
40.0000 mg | SUBCUTANEOUS | Status: DC
  Administered 2019-08-09 – 2019-08-11 (×3): 40 mg via SUBCUTANEOUS
  Filled 2019-08-09 (×4): qty 0.4

## 2019-08-09 MED ORDER — HYDROCOD POLST-CPM POLST ER 10-8 MG/5ML PO SUER
5.0000 mL | Freq: Two times a day (BID) | ORAL | Status: DC | PRN
  Administered 2019-08-09 – 2019-08-10 (×2): 5 mL via ORAL
  Filled 2019-08-09 (×2): qty 5

## 2019-08-09 NOTE — ED Notes (Signed)
Admitting provider at bedside.

## 2019-08-09 NOTE — ED Notes (Signed)
ED TO INPATIENT HANDOFF REPORT  Name/Age/Gender Roger Howe 63 y.o. male  Code Status   Home/SNF/Other Home  Chief Complaint Possible Covid  Level of Care/Admitting Diagnosis ED Disposition    ED Disposition Condition Comment   Admit  The patient appears reasonably stabilized for admission considering the current resources, flow, and capabilities available in the ED at this time, and I doubt any other [facility] requiring further screening and/or treatment in the ED prior to admission is  present.       Medical History No past medical history on file.  Allergies Not on File  IV Location/Drains/Wounds Patient Lines/Drains/Airways Status   Active Line/Drains/Airways    Name:   Placement date:   Placement time:   Site:   Days:   Peripheral IV 08/09/19 Right Antecubital   08/09/19    0942    Antecubital   less than 1          Labs/Imaging Results for orders placed or performed during the hospital encounter of 08/09/19 (from the past 48 hour(s))  Comprehensive metabolic panel     Status: Abnormal   Collection Time: 08/09/19  8:37 AM  Result Value Ref Range   Sodium 136 135 - 145 mmol/L   Potassium 3.3 (L) 3.5 - 5.1 mmol/L   Chloride 104 98 - 111 mmol/L   CO2 22 22 - 32 mmol/L   Glucose, Bld 106 (H) 70 - 99 mg/dL   BUN 12 8 - 23 mg/dL   Creatinine, Ser 0.76 0.61 - 1.24 mg/dL   Calcium 8.5 (L) 8.9 - 10.3 mg/dL   Total Protein 7.5 6.5 - 8.1 g/dL   Albumin 3.1 (L) 3.5 - 5.0 g/dL   AST 30 15 - 41 U/L   ALT 21 0 - 44 U/L   Alkaline Phosphatase 51 38 - 126 U/L   Total Bilirubin 0.9 0.3 - 1.2 mg/dL   GFR calc non Af Amer >60 >60 mL/min   GFR calc Af Amer >60 >60 mL/min   Anion gap 10 5 - 15    Comment: Performed at [facility], [place] [address] [place], [place] [address]  Troponin I (High Sensitivity)     Status: None   Collection Time: 08/09/19  8:37 AM  Result Value Ref Range   Troponin I (High Sensitivity) 6 <18 ng/L    Comment: (NOTE) Elevated  high sensitivity troponin I (hsTnI) values and significant  changes across serial measurements may suggest ACS but many other  chronic and acute conditions are known to elevate hsTnI results.  Refer to the "Links" section for chest pain algorithms and additional  guidance. Performed at [facility], [place] [address] [place], [place] [address]   CBC with Differential     Status: Abnormal   Collection Time: 08/09/19  8:37 AM  Result Value Ref Range   WBC 9.2 4.0 - 10.5 K/uL   RBC 4.60 4.22 - 5.81 MIL/uL   Hemoglobin 12.7 (L) 13.0 - 17.0 g/dL   HCT 37.9 (L) 39.0 - 52.0 %   MCV 82.4 80.0 - 100.0 fL   MCH 27.6 26.0 - 34.0 pg   MCHC 33.5 30.0 - 36.0 g/dL   RDW 13.2 11.5 - 15.5 %   Platelets 284 150 - 400 K/uL   nRBC 0.0 0.0 - 0.2 %   Neutrophils Relative % 65 %   Neutro Abs 5.9 1.7 - 7.7 K/uL   Lymphocytes Relative 19 %   Lymphs Abs 1.7 0.7 -  4.0 K/uL   Monocytes Relative 15 %   Monocytes Absolute 1.4 (H) 0.1 - 1.0 K/uL   Eosinophils Relative 1 %   Eosinophils Absolute 0.1 0.0 - 0.5 K/uL   Basophils Relative 0 %   Basophils Absolute 0.0 0.0 - 0.1 K/uL   Immature Granulocytes 0 %   Abs Immature Granulocytes 0.04 0.00 - 0.07 K/uL    Comment: Performed at [facility], [address] [place], [place] [address]  Lactic acid, plasma     Status: None   Collection Time: 08/09/19  8:37 AM  Result Value Ref Range   Lactic Acid, Venous 1.1 0.5 - 1.9 mmol/L    Comment: Performed at [facility], [address] [place], [place] [address]  SARS Coronavirus 2 [facility](Hospital order, Performed in [facility] hospital lab) Nasopharyngeal Nasopharyngeal Swab     Status: Abnormal   Collection Time: 08/09/19  8:38 AM   Specimen: Nasopharyngeal Swab  Result Value Ref Range   SARS Coronavirus 2 POSITIVE (A) NEGATIVE    Comment: RESULT CALLED TO, READ BACK BY AND VERIFIED WITH: K.Ceaser Ebeling,RN [phone] @1102  BY V.WILKINS (NOTE) If result is  NEGATIVE SARS-CoV-2 target nucleic acids are NOT DETECTED. The SARS-CoV-2 RNA is generally detectable in upper and lower  respiratory specimens during the acute phase of infection. The lowest  concentration of SARS-CoV-2 viral copies this assay can detect is 250  copies / mL. A negative result does not preclude SARS-CoV-2 infection  and should not be used as the sole basis for treatment or other  patient management decisions.  A negative result may occur with  improper specimen collection / handling, submission of specimen other  than nasopharyngeal swab, presence of viral mutation(s) within the  areas targeted by this assay, and inadequate number of viral copies  (<250 copies / mL). A negative result must be combined with clinical  observations, patient history, and epidemiological information. If result is POSITIVE SARS-CoV-2 target nucleic acids are DETECTED.  The SARS-CoV-2 RNA is generally detectable in upper and lower  respiratory specimens during the acute phase of infection.  Positive  results are indicative of active infection with SARS-CoV-2.  Clinical  correlation with patient history and other diagnostic information is  necessary to determine patient infection status.  Positive results do  not rule out bacterial infection or co-infection with other viruses. If result is PRESUMPTIVE POSTIVE SARS-CoV-2 nucleic acids MAY BE PRESENT.   A presumptive positive result was obtained on the submitted specimen  and confirmed on repeat testing.  While 2019 novel coronavirus  (SARS-CoV-2) nucleic acids may be present in the submitted sample  additional confirmatory testing may be necessary for epidemiological  and / or clinical management purposes  to differentiate between  SARS-CoV-2 and other Sarbecovirus currently known to infect humans.  If clinically indicated additional testing with an alternate test  methodology [id number])  is advised. The SARS-CoV-2 RNA is generally  detectable  in upper and lower respiratory specimens during the acute  phase of infection. The expected result is Negative. Fact Sheet for Patients:  BoilerBrush.com.cyhttps://www.fda.gov/media/136312/download Fact Sheet for Healthcare Providers: https://pope.com/https://www.fda.gov/media/136313/download This test is not yet approved or cleared by the [place] FDA and has been authorized for detection and/or diagnosis of SARS-CoV-2 by FDA under an Emergency Use Authorization (EUA).  This EUA will remain in effect (meaning this test can be used) for the duration of the COVID-19 declaration under Section 564(b)(1) of the Act, 21 U.S.C. section 360bbb-3(b)(1), unless the authorization is  terminated or revoked sooner. Performed at [facility], [address], [place], [place] [address]   Culture, blood (routine x 2)     Status: None (Preliminary result)   Collection Time: 08/09/19  8:43 AM   Specimen: BLOOD  Result Value Ref Range   Specimen Description BLOOD LEFT ANTECUBITAL    Special Requests      BOTTLES DRAWN AEROBIC AND ANAEROBIC Blood Culture adequate volume Performed at [facility], [address], [place], [place] [address]    Culture PENDING    Report Status PENDING    Dg Chest Portable 1 View  Result Date: 08/09/2019 CLINICAL DATA:  Dyspnea EXAM: PORTABLE CHEST 1 VIEW COMPARISON:  06/14/2014 chest radiograph. FINDINGS: Intact sternotomy wires. Stable cardiomediastinal silhouette with mild cardiomegaly. No pneumothorax. No pleural effusion. Extensive patchy opacity throughout the periphery of both lungs, most prominent in the lower left lung. IMPRESSION: Extensive patchy opacity throughout the periphery of both lungs, most prominent in the lower left lung. Findings are worrisome for atypical pneumonia, including viral pneumonia. Electronically Signed   By: Delbert Phenix M.D.   On: 08/09/2019 09:40    Pending Labs Unresulted Labs (From admission, onward)    Start     Ordered   08/09/19 0838   Culture, blood (routine x 2)  BLOOD CULTURE X 2,   STAT    Question:  Patient immune status  Answer:  Normal   08/09/19 0838   08/09/19 0838  Urine culture  ONCE - STAT,   STAT    Question:  Patient immune status  Answer:  Normal   08/09/19 0838   08/09/19 0837  Lactic acid, plasma  Now then every 2 hours,   STAT     08/09/19 0838   08/09/19 0837  Urinalysis, Routine w reflex microscopic  ONCE - STAT,   STAT     08/09/19 0838          Vitals/Pain Today's Vitals   08/09/19 1045 08/09/19 1100 08/09/19 1115 08/09/19 1130  BP:  120/72  127/75  Pulse: 75 75 73 74  Resp: (!) 30 (!) 26 (!) 35 (!) 34  Temp:      TempSrc:      SpO2: 97% 95% 98% 97%  Weight:      Height:      PainSc:        Isolation Precautions Airborne and Contact precautions  Medications Medications  sodium chloride 0.9 % bolus 500 mL (0 mLs Intravenous Stopped 08/09/19 1102)    Mobility walks

## 2019-08-09 NOTE — ED Notes (Signed)
Attempted report x1. 

## 2019-08-09 NOTE — Plan of Care (Signed)
Initiated POC 

## 2019-08-09 NOTE — ED Notes (Addendum)
Interpreter used Nhat [id number]  Pt reports coughing, decreased appetite x2 weeks. Pt feels SHOB.  Denies pain at this time.  Denies recent fevers, decent recent sick contacts.  Pt also c/o "bitter taste that prevents me from swallowing."  Reports Hx of High BP.  Pt denies any allergies.

## 2019-08-09 NOTE — ED Triage Notes (Signed)
Pt BIBA from home, c/o [facility] x2 weeks. Pt reports speaking [place].   VS  HR 80 SpO2 93% on RA- 98% on 3L  126/90 Tympanic- 97.9

## 2019-08-09 NOTE — ED Notes (Signed)
ED TO INPATIENT HANDOFF REPORT  Name/Age/Gender Roger Howe 63 y.o. male  Code Status   Home/SNF/Other Home  Chief Complaint Possible Covid  Level of Care/Admitting Diagnosis ED Disposition    ED Disposition Condition Comment   Admit  Hospital Area: [facility] [[id number]]  Level of Care: Telemetry [5]  Covid Evaluation: Confirmed COVID Positive  Diagnosis: COVID-19 [[id number]]  Admitting Physician: Albertine Grates [[id number]]  Attending Physician: Albertine Grates [[id number]]  Estimated length of stay: past midnight tomorrow  Certification:: I certify this patient will need inpatient services for at least 2 midnights  PT Class (Do Not Modify): Inpatient [101]  PT Acc Code (Do Not Modify): Private [1]       Medical History No past medical history on file.  Allergies Not on File  IV Location/Drains/Wounds Patient Lines/Drains/Airways Status   Active Line/Drains/Airways    Name:   Placement date:   Placement time:   Site:   Days:   Peripheral IV 08/09/19 Right Antecubital   08/09/19    0942    Antecubital   less than 1          Labs/Imaging Results for orders placed or performed during the hospital encounter of 08/09/19 (from the past 48 hour(s))  Comprehensive metabolic panel     Status: Abnormal   Collection Time: 08/09/19  8:37 AM  Result Value Ref Range   Sodium 136 135 - 145 mmol/L   Potassium 3.3 (L) 3.5 - 5.1 mmol/L   Chloride 104 98 - 111 mmol/L   CO2 22 22 - 32 mmol/L   Glucose, Bld 106 (H) 70 - 99 mg/dL   BUN 12 8 - 23 mg/dL   Creatinine, Ser 9.03 0.61 - 1.24 mg/dL   Calcium 8.5 (L) 8.9 - 10.3 mg/dL   Total Protein 7.5 6.5 - 8.1 g/dL   Albumin 3.1 (L) 3.5 - 5.0 g/dL   AST 30 15 - 41 U/L   ALT 21 0 - 44 U/L   Alkaline Phosphatase 51 38 - 126 U/L   Total Bilirubin 0.9 0.3 - 1.2 mg/dL   GFR calc non Af Amer >60 >60 mL/min   GFR calc Af Amer >60 >60 mL/min   Anion gap 10 5 - 15    Comment: Performed at [facility], [address], [place], [place] [address]  Troponin I (High Sensitivity)     Status: None   Collection Time: 08/09/19  8:37 AM  Result Value Ref Range   Troponin I (High Sensitivity) 6 <18 ng/L    Comment: (NOTE) Elevated high sensitivity troponin I (hsTnI) values and significant  changes across serial measurements may suggest ACS but many other  chronic and acute conditions are known to elevate hsTnI results.  Refer to the "Links" section for chest pain algorithms and additional  guidance. Performed at [facility], [address], [place], [place] [address]   CBC with Differential     Status: Abnormal   Collection Time: 08/09/19  8:37 AM  Result Value Ref Range   WBC 9.2 4.0 - 10.5 K/uL   RBC 4.60 4.22 - 5.81 MIL/uL   Hemoglobin 12.7 (L) 13.0 - 17.0 g/dL   HCT 16.6 (L) 06.0 - 04.5 %   MCV 82.4 80.0 - 100.0 fL   MCH 27.6 26.0 - 34.0 pg   MCHC 33.5 30.0 - 36.0 g/dL   RDW 99.7 74.1 - 42.3 %   Platelets 284 150 - 400 K/uL  nRBC 0.0 0.0 - 0.2 %   Neutrophils Relative % 65 %   Neutro Abs 5.9 1.7 - 7.7 K/uL   Lymphocytes Relative 19 %   Lymphs Abs 1.7 0.7 - 4.0 K/uL   Monocytes Relative 15 %   Monocytes Absolute 1.4 (H) 0.1 - 1.0 K/uL   Eosinophils Relative 1 %   Eosinophils Absolute 0.1 0.0 - 0.5 K/uL   Basophils Relative 0 %   Basophils Absolute 0.0 0.0 - 0.1 K/uL   Immature Granulocytes 0 %   Abs Immature Granulocytes 0.04 0.00 - 0.07 K/uL    Comment: Performed at [facility], [address], [place], [place] [address]  Lactic acid, plasma     Status: None   Collection Time: 08/09/19  8:37 AM  Result Value Ref Range   Lactic Acid, Venous 1.1 0.5 - 1.9 mmol/L    Comment: Performed at [facility], [address], [place], [place] [address]  SARS Coronavirus 2 [facility] order, Performed in [facility] hospital lab) Nasopharyngeal Nasopharyngeal Swab     Status: Abnormal   Collection Time: 08/09/19  8:38 AM   Specimen:  Nasopharyngeal Swab  Result Value Ref Range   SARS Coronavirus 2 POSITIVE (A) NEGATIVE    Comment: RESULT CALLED TO, READ BACK BY AND VERIFIED WITH: K.Fancy Dunkley,RN [id number]  BY V.WILKINS (NOTE) If result is NEGATIVE SARS-CoV-2 target nucleic acids are NOT DETECTED. The SARS-CoV-2 RNA is generally detectable in upper and lower  respiratory specimens during the acute phase of infection. The lowest  concentration of SARS-CoV-2 viral copies this assay can detect is 250  copies / mL. A negative result does not preclude SARS-CoV-2 infection  and should not be used as the sole basis for treatment or other  patient management decisions.  A negative result may occur with  improper specimen collection / handling, submission of specimen other  than nasopharyngeal swab, presence of viral mutation(s) within the  areas targeted by this assay, and inadequate number of viral copies  (<250 copies / mL). A negative result must be combined with clinical  observations, patient history, and epidemiological information. If result is POSITIVE SARS-CoV-2 target nucleic acids are DETECTED.  The SARS-CoV-2 RNA is generally detectable in upper and lower  respiratory specimens during the acute phase of infection.  Positive  results are indicative of active infection with SARS-CoV-2.  Clinical  correlation with patient history and other diagnostic information is  necessary to determine patient infection status.  Positive results do  not rule out bacterial infection or co-infection with other viruses. If result is PRESUMPTIVE POSTIVE SARS-CoV-2 nucleic acids MAY BE PRESENT.   A presumptive positive result was obtained on the submitted specimen  and confirmed on repeat testing.  While 2019 novel coronavirus  (SARS-CoV-2) nucleic acids may be present in the submitted sample  additional confirmatory testing may be necessary for epidemiological  and / or clinical management purposes  to differentiate between   SARS-CoV-2 and other Sarbecovirus currently known to infect humans.  If clinically indicated additional testing with an alternate test  methodology [phone])  is advised. The SARS-CoV-2 RNA is generally  detectable in upper and lower respiratory specimens during the acute  phase of infection. The expected result is Negative. Fact Sheet for Patients:  BoilerBrush.com.cy Fact Sheet for Healthcare Providers: https://pope.com/ This test is not yet approved or cleared by the [place] FDA and has been authorized for detection and/or diagnosis of SARS-CoV-2 by FDA under an Emergency Use Authorization (  EUA).  This EUA will remain in effect (meaning this test can be used) for the duration of the COVID-19 declaration under Section 564(b)(1) of the Act, 21 U.S.C. section 360bbb-3(b)(1), unless the authorization is terminated or revoked sooner. Performed at [facility], [address] [place], [place] [address]   Culture, blood (routine x 2)     Status: None (Preliminary result)   Collection Time: 08/09/19  8:43 AM   Specimen: BLOOD  Result Value Ref Range   Specimen Description BLOOD LEFT ANTECUBITAL    Special Requests      BOTTLES DRAWN AEROBIC AND ANAEROBIC Blood Culture adequate volume Performed at [facility], [address] [place], [place] [address]    Culture PENDING    Report Status PENDING   Ferritin     Status: Abnormal   Collection Time: 08/09/19 12:16 PM  Result Value Ref Range   Ferritin 360 (H) 24 - 336 ng/mL    Comment: Performed at [facility], [address] [place], [place] [address]  Triglycerides     Status: None   Collection Time: 08/09/19 12:16 PM  Result Value Ref Range   Triglycerides 97 <150 mg/dL    Comment: Performed at [facility], [address] [place], [place] [address]  C-reactive protein     Status: Abnormal   Collection Time:  08/09/19 12:16 PM  Result Value Ref Range   CRP 11.4 (H) <1.0 mg/dL    Comment: Performed at [facility], [address] [place], [place] [address]  D-dimer, quantitative (not at [facility])     Status: Abnormal   Collection Time: 08/09/19 12:45 PM  Result Value Ref Range   D-Dimer, Quant 2.08 (H) 0.00 - 0.50 ug/mL-FEU    Comment: (NOTE) At the manufacturer cut-off of 0.50 ug/mL FEU, this assay has been documented to exclude PE with a sensitivity and negative predictive value of 97 to 99%.  At this time, this assay has not been approved by the FDA to exclude DVT/VTE. Results should be correlated with clinical presentation. Performed at [facility], [address] [place], [place] [address]   Lactate dehydrogenase     Status: Abnormal   Collection Time: 08/09/19 12:45 PM  Result Value Ref Range   LDH 232 (H) 98 - 192 U/L    Comment: Performed at [facility], [address] [place], [place] [address]  Procalcitonin     Status: None   Collection Time: 08/09/19 12:45 PM  Result Value Ref Range   Procalcitonin <0.10 ng/mL    Comment:        Interpretation: PCT (Procalcitonin) <= 0.5 ng/mL: Systemic infection (sepsis) is not likely. Local bacterial infection is possible. (NOTE)       Sepsis PCT Algorithm           Lower Respiratory Tract                                      Infection PCT Algorithm    ----------------------------     ----------------------------         PCT < 0.25 ng/mL                PCT < 0.10 ng/mL         Strongly encourage             Strongly discourage  discontinuation of antibiotics    initiation of antibiotics    ----------------------------     -----------------------------       PCT 0.25 - 0.50 ng/mL            PCT 0.10 - 0.25 ng/mL               OR       >80% decrease in PCT            Discourage initiation of                                            antibiotics      Encourage  discontinuation           of antibiotics    ----------------------------     -----------------------------         PCT >= 0.50 ng/mL              PCT 0.26 - 0.50 ng/mL               AND        <80% decrease in PCT             Encourage initiation of                                             antibiotics       Encourage continuation           of antibiotics    ----------------------------     -----------------------------        PCT >= 0.50 ng/mL                  PCT > 0.50 ng/mL               AND         increase in PCT                  Strongly encourage                                      initiation of antibiotics    Strongly encourage escalation           of antibiotics                                     -----------------------------                                           PCT <= 0.25 ng/mL                                                 OR                                        >  80% decrease in PCT                                     Discontinue / Do not initiate                                             antibiotics Performed at [facility], [address] [place], [place] [address]    Dg Chest Portable 1 View  Result Date: 08/09/2019 CLINICAL DATA:  Dyspnea EXAM: PORTABLE CHEST 1 VIEW COMPARISON:  06/14/2014 chest radiograph. FINDINGS: Intact sternotomy wires. Stable cardiomediastinal silhouette with mild cardiomegaly. No pneumothorax. No pleural effusion. Extensive patchy opacity throughout the periphery of both lungs, most prominent in the lower left lung. IMPRESSION: Extensive patchy opacity throughout the periphery of both lungs, most prominent in the lower left lung. Findings are worrisome for atypical pneumonia, including viral pneumonia. Electronically Signed   By: Delbert PhenixJason A Poff M.D.   On: 08/09/2019 09:40    Pending Labs Unresulted Labs (From admission, onward)    Start     Ordered   08/09/19 1216  Interleukin-6, Plasma  Once,   STAT      08/09/19 1215   08/09/19 1216  Sedimentation rate  Once,   STAT     08/09/19 1215   08/09/19 1215  HIV antibody (Routine Testing)  Once,   STAT     08/09/19 1215   08/09/19 1215  ABO/Rh  Once,   STAT     08/09/19 1215   08/09/19 0838  Culture, blood (routine x 2)  BLOOD CULTURE X 2,   STAT    Question:  Patient immune status  Answer:  Normal   08/09/19 0838   08/09/19 0838  Urine culture  ONCE - STAT,   STAT    Question:  Patient immune status  Answer:  Normal   08/09/19 0838   08/09/19 0837  Urinalysis, Routine w reflex microscopic  ONCE - STAT,   STAT     08/09/19 0838   Signed and Held  CBC  (enoxaparin (LOVENOX)    CrCl >/= 30 ml/min)  Once,   R    Comments: Baseline for enoxaparin therapy IF NOT ALREADY DRAWN.  Notify MD if PLT < 100 K.    Signed and Held   Signed and Held  Creatinine, serum  (enoxaparin (LOVENOX)    CrCl >/= 30 ml/min)  Once,   R    Comments: Baseline for enoxaparin therapy IF NOT ALREADY DRAWN.    Signed and Held   Signed and Held  Creatinine, serum  (enoxaparin (LOVENOX)    CrCl >/= 30 ml/min)  Weekly,   R    Comments: while on enoxaparin therapy    Signed and Held   Signed and Held  CBC  Tomorrow morning,   R     Signed and Held   Signed and Held  Comprehensive metabolic panel  Tomorrow morning,   R     Signed and Held          Vitals/Pain Today's Vitals   08/09/19 1115 08/09/19 1130 08/09/19 1200 08/09/19 1230  BP:  127/75 128/76 129/72  Pulse: 73 74 76 76  Resp: (!) 35 (!) 34 (!) 23 (!) 35  Temp:      TempSrc:  SpO2: 98% 97% 97% 97%  Weight:      Height:      PainSc:        Isolation Precautions Airborne and Contact precautions  Medications Medications  dexamethasone (DECADRON) injection 6 mg (has no administration in time range)  albuterol (VENTOLIN HFA) 108 (90 Base) MCG/ACT inhaler 1-2 puff (has no administration in time range)  sodium chloride 0.9 % bolus 500 mL (0 mLs Intravenous Stopped 08/09/19 1102)     Mobility walks

## 2019-08-09 NOTE — ED Provider Notes (Signed)
Emergency Department Provider Note   I have reviewed the triage vital signs and the nursing notes.   HISTORY  Chief Complaint Shortness of Breath  HPI, ROS, and Exam performed with the assistance of a video [place] interpreter.   HPI Roger Howe is a 63 y.o. male with past medical history of CAD status post CABG and HTN presents to the emergency department for evaluation of 2 weeks with shortness of breath, cough.  Patient describes progressively worsening symptoms.  He denies associated chest pain.  He has not recorded fever but feels fatigued and chills at times.  Denies headache or abdominal pain.  No vomiting or diarrhea.  Symptoms seem to worsen this morning and EMS was called.  They report finding the patient visibly dyspneic and mildly hypoxemic with SPO2 of 93% on room air.  They provided 3 L nasal cannula oxygen and shortness of breath symptoms improved.  Patient denies any sick contacts. Patient does not smoke cigarettes.    Past Medical History:  Diagnosis Date  . Coronary artery disease     Patient Active Problem List   Diagnosis Date Noted  . COVID-19 08/09/2019  . COVID-19 virus detected   . Hypokalemia   . Hx of CABG   . Coronary artery disease involving native coronary artery of native heart without angina pectoris    Allergies Patient has no known allergies.  No family history on file.  Social History Social History   Tobacco Use  . Smoking status: Not on file  Substance Use Topics  . Alcohol use: Never    Frequency: Never  . Drug use: Never    Review of Systems  Constitutional: No fever/chills. Positive fatigue.  Eyes: No visual changes. ENT: No sore throat. Cardiovascular: Denies chest pain and cough.  Respiratory: Positive shortness of breath. Gastrointestinal: No abdominal pain.  No nausea, no vomiting.  No diarrhea.  No constipation. Genitourinary: Negative for dysuria. Musculoskeletal: Negative for back pain. Skin: Negative for  rash. Neurological: Negative for headaches, focal weakness or numbness.  10-point ROS otherwise negative.  ____________________________________________   PHYSICAL EXAM:  VITAL SIGNS: ED Triage Vitals  Enc Vitals Group     BP --      Pulse Rate 08/09/19 0831 78     Resp 08/09/19 0831 (!) 28     Temp 08/09/19 0831 99.6 F (37.6 C)     Temp Source 08/09/19 0831 Oral     SpO2 08/09/19 0809 96 %     Weight 08/09/19 0813 193 lb (87.5 kg)     Height 08/09/19 0813 5' 8.9" (1.75 m)   Constitutional: Alert with some mild confusion at times.  Eyes: Conjunctivae are normal.  Head: Atraumatic. Nose: No congestion/rhinnorhea. Mouth/Throat: Mucous membranes are moist.  Neck: No stridor.   Cardiovascular: Normal rate, regular rhythm. Good peripheral circulation. Grossly normal heart sounds.   Respiratory: Increased respiratory effort.  No retractions. Lungs CTAB. Gastrointestinal: Soft and nontender. No distention.  Musculoskeletal: No lower extremity tenderness nor edema.  Neurologic:  Normal speech and language. Mild confusion. No gross unilateral weakness.  Skin:  Skin is warm, dry and intact. No rash noted.  ____________________________________________   LABS (all labs ordered are listed, but only abnormal results are displayed)  Labs Reviewed  SARS CORONAVIRUS 2 (HOSPITAL ORDER, PERFORMED IN [facility]) - Abnormal; Notable for the following components:      Result Value   SARS Coronavirus 2 POSITIVE (*)    All other components within normal  limits  COMPREHENSIVE METABOLIC PANEL - Abnormal; Notable for the following components:   Potassium 3.3 (*)    Glucose, Bld 106 (*)    Calcium 8.5 (*)    Albumin 3.1 (*)    All other components within normal limits  CBC WITH DIFFERENTIAL/PLATELET - Abnormal; Notable for the following components:   Hemoglobin 12.7 (*)    HCT 37.9 (*)    Monocytes Absolute 1.4 (*)    All other components within normal limits  D-DIMER,  QUANTITATIVE (NOT AT [facility]) - Abnormal; Notable for the following components:   D-Dimer, Quant 2.08 (*)    All other components within normal limits  FERRITIN - Abnormal; Notable for the following components:   Ferritin 360 (*)    All other components within normal limits  LACTATE DEHYDROGENASE - Abnormal; Notable for the following components:   LDH 232 (*)    All other components within normal limits  SEDIMENTATION RATE - Abnormal; Notable for the following components:   Sed Rate 73 (*)    All other components within normal limits  C-REACTIVE PROTEIN - Abnormal; Notable for the following components:   CRP 11.4 (*)    All other components within normal limits  CULTURE, BLOOD (ROUTINE X 2)  CULTURE, BLOOD (ROUTINE X 2)  URINE CULTURE  LACTIC ACID, PLASMA  PROCALCITONIN  TRIGLYCERIDES  URINALYSIS, ROUTINE W REFLEX MICROSCOPIC  HIV ANTIBODY (ROUTINE TESTING W REFLEX)  INTERLEUKIN-6, PLASMA  CBC  COMPREHENSIVE METABOLIC PANEL  C-REACTIVE PROTEIN  FERRITIN  D-DIMER, QUANTITATIVE (NOT AT [facility])  ABO/RH  TROPONIN I (HIGH SENSITIVITY)   ____________________________________________  EKG   EKG Interpretation  Date/Time:  Sunday August 09 2019 08:59:23 EDT Ventricular Rate:  81 PR Interval:    QRS Duration: 100 QT Interval:  372 QTC Calculation: 432 R Axis:   47 Text Interpretation:  Sinus rhythm No STEMI  Confirmed by Nanda Quinton [phone]) on 08/09/2019 9:04:42 AM       ____________________________________________  RADIOLOGY  Dg Chest Portable 1 View  Result Date: 08/09/2019 CLINICAL DATA:  Dyspnea EXAM: PORTABLE CHEST 1 VIEW COMPARISON:  06/14/2014 chest radiograph. FINDINGS: Intact sternotomy wires. Stable cardiomediastinal silhouette with mild cardiomegaly. No pneumothorax. No pleural effusion. Extensive patchy opacity throughout the periphery of both lungs, most prominent in the lower left lung. IMPRESSION: Extensive patchy opacity throughout the periphery of both lungs,  most prominent in the lower left lung. Findings are worrisome for atypical pneumonia, including viral pneumonia. Electronically Signed   By: Ilona Sorrel M.D.   On: 08/09/2019 09:40    ____________________________________________   PROCEDURES  Procedure(s) performed:   Procedures  CRITICAL CARE Performed by: Margette Fast Total critical care time: 35 minutes Critical care time was exclusive of separately billable procedures and treating other patients. Critical care was necessary to treat or prevent imminent or life-threatening deterioration. Critical care was time spent personally by me on the following activities: development of treatment plan with patient and/or surrogate as well as nursing, discussions with consultants, evaluation of patient's response to treatment, examination of patient, obtaining history from patient or surrogate, ordering and performing treatments and interventions, ordering and review of laboratory studies, ordering and review of radiographic studies, pulse oximetry and re-evaluation of patient's condition.  Nanda Quinton, MD Emergency Medicine  ____________________________________________   INITIAL IMPRESSION / ASSESSMENT AND PLAN / ED COURSE  Pertinent labs & imaging results that were available during my care of the patient were reviewed by me and considered in my medical decision making (see  chart for details).   Patient presents to the emergency department with shortness of breath, cough, possible fever.  He does have some mild confusion on exam but is able to tell me he is in the hospital in [place].  The interview and exam were conducted with [place] interpreter.  Patient with 2 weeks of symptoms.  Tachypnea with borderline fever here.  COVID-19 is a consideration along with community-acquired pneumonia.  Lower suspicion for atypical ACS.  Plan for screening lab work, cultures, chest x-ray, COVID-19 testing, reassess.  COVID positive. CXR with  infiltrate. Labs reviewed. Patient with many medical co-morbidities and dyspnea with even mild exertion in the exam room. Plan for admit for monitoring and COVID treatment.   Discussed patient's case with Hospitalist, Dr. Roda Shutters to request admission. Patient and family (if present) updated with plan. Care transferred to Hospitalist service.  I reviewed all nursing notes, vitals, pertinent old records, EKGs, labs, imaging (as available).    Roger Howe was evaluated in Emergency Department on 08/09/2019 for the symptoms described in the history of present illness. He was evaluated in the context of the global COVID-19 pandemic, which necessitated consideration that the patient might be at risk for infection with the SARS-CoV-2 virus that causes COVID-19. Institutional protocols and algorithms that pertain to the evaluation of patients at risk for COVID-19 are in a state of rapid change based on information released by regulatory bodies including the CDC and federal and state organizations. These policies and algorithms were followed during the patient's care in the ED.  ____________________________________________  FINAL CLINICAL IMPRESSION(S) / ED DIAGNOSES  Final diagnoses:  COVID-19 virus detected  SOB (shortness of breath)     MEDICATIONS GIVEN DURING THIS VISIT:  Medications  albuterol (VENTOLIN HFA) 108 (90 Base) MCG/ACT inhaler 1-2 puff (has no administration in time range)  enoxaparin (LOVENOX) injection 40 mg (40 mg Subcutaneous Given 08/09/19 1700)  methylPREDNISolone sodium succinate (SOLU-MEDROL) 40 mg/mL injection 40 mg (40 mg Intravenous Given 08/09/19 1658)  acetaminophen (TYLENOL) tablet 650 mg (has no administration in time range)  ondansetron (ZOFRAN) injection 4 mg (has no administration in time range)  benzonatate (TESSALON) capsule 200 mg (200 mg Oral Given 08/09/19 1659)  chlorpheniramine-HYDROcodone (TUSSIONEX) 10-8 MG/5ML suspension 5 mL (has no administration in time range)   sodium chloride 0.9 % bolus 500 mL (0 mLs Intravenous Stopped 08/09/19 1102)  potassium chloride SA (K-DUR) CR tablet 40 mEq (40 mEq Oral Given 08/09/19 1659)     Note:  This document was prepared using Dragon voice recognition software and may include unintentional dictation errors.  Alona Bene, MD Emergency Medicine    Jerson Furukawa, Arlyss Repress, MD 08/09/19 [phone]

## 2019-08-09 NOTE — Progress Notes (Signed)
Pt admitted from [facility], unit procedure and POC reviewed with pt utilizing [employer] interpreter Evelena Leyden [phone], pt speaks limited [place] and predominant [place]. Pt belongings in closet including wallet with cash, counted in front of pt 390 dollars. Pt understands belongings remain in room due to COVID precautions. No meds present pt has cell phone, charger and shoes/clothing.

## 2019-08-09 NOTE — Plan of Care (Signed)
  Problem: Education: Goal: Knowledge of General Education information will improve Description: Including pain rating scale, medication(s)/side effects and non-pharmacologic comfort measures Outcome: Progressing   Problem: Health Behavior/Discharge Planning: Goal: Ability to manage health-related needs will improve Outcome: Progressing   Problem: Clinical Measurements: Goal: Ability to maintain clinical measurements within normal limits will improve Outcome: Progressing Goal: Will remain free from infection Outcome: Progressing Goal: Diagnostic test results will improve Outcome: Progressing Goal: Respiratory complications will improve Outcome: Progressing   Problem: Activity: Goal: Risk for activity intolerance will decrease Outcome: Progressing   Problem: Nutrition: Goal: Adequate nutrition will be maintained Outcome: Progressing   Problem: Coping: Goal: Level of anxiety will decrease Outcome: Progressing   Problem: Clinical Measurements: Goal: Cardiovascular complication will be avoided Outcome: Completed/Met

## 2019-08-09 NOTE — H&P (Signed)
History and Physical  Festus Riedl VEL:[id number] DOB: 1956-03-02 DOA: 08/09/2019  Referring physician: EDP PCP: Patient, No Pcp Per   Chief Complaint: sob, cough x1 week, altered taste   HPI: Roger Howe is a 63 y.o. male  Patient does not speak english, no family at bedside, HPI is limited.  H/o CAD  S/p CABG, presents to [facility] long ED due to above complaints. He Denies chest pain, no n/v. Though reports altered taste.   ED course: tmax 99.6, tachypnea, rr from 26-37, though no hypoxia at rest, he does not appear in distress at rest. Heart rate and bp stable.  SARSCOV2 screening +  cxr " Extensive patchy opacity throughout the periphery of both lungs, most prominent in the lower left lung. Findings are worrisome for atypical pneumonia, including viral pneumonia."  EKG: sinus rhythm, no acute ST/T changes, Qtc unremarkable.  Labs: wbc 9.2, hgb 12.7, plt 284, k 3.3, cr 0.76, troponin negative, lactic acid 1.1, Blood culture obtained in the ED He received 500cc iv fluid bolus in the ED, EDP called hospitalist to admit the patient.    Review of Systems:  Detail per HPI, Review of systems are otherwise negative  PMH: CAD s/p CABG  Social History: Patient lives at home & is able to participate in activities of daily living independently    Prior to Admission medications   Not on File    Physical Exam: BP 127/75   Pulse 74   Temp 99.6 F (37.6 C) (Oral)   Resp (!) 34   Ht 5' 8.9" (1.75 m)   Wt 87.5 kg   SpO2 97%   BMI 28.59 kg/m   . General:  NAD . Eyes: PERRL . ENT: unremarkable . Neck: supple, no JVD . Cardiovascular: RRR . Respiratory: CTABL . Abdomen: soft/NT/ND, positive bowel sounds . Skin: no rash . Musculoskeletal:  No edema . Psychiatric: calm/cooperative . Neurologic: no focal findings            Labs on Admission:  Basic Metabolic Panel: Recent Labs  Lab 08/09/19 0837  NA 136  K 3.3*  CL 104  CO2 22  GLUCOSE 106*  BUN 12  CREATININE 0.76   CALCIUM 8.5*   Liver Function Tests: Recent Labs  Lab 08/09/19 0837  AST 30  ALT 21  ALKPHOS 51  BILITOT 0.9  PROT 7.5  ALBUMIN 3.1*   No results for input(s): LIPASE, AMYLASE in the last 168 hours. No results for input(s): AMMONIA in the last 168 hours. CBC: Recent Labs  Lab 08/09/19 0837  WBC 9.2  NEUTROABS 5.9  HGB 12.7*  HCT 37.9*  MCV 82.4  PLT 284   Cardiac Enzymes: No results for input(s): CKTOTAL, CKMB, CKMBINDEX, TROPONINI in the last 168 hours.  BNP (last 3 results) No results for input(s): BNP in the last 8760 hours.  ProBNP (last 3 results) No results for input(s): PROBNP in the last 8760 hours.  CBG: No results for input(s): GLUCAP in the last 168 hours.  Radiological Exams on Admission: Dg Chest Portable 1 View  Result Date: 08/09/2019 CLINICAL DATA:  Dyspnea EXAM: PORTABLE CHEST 1 VIEW COMPARISON:  06/14/2014 chest radiograph. FINDINGS: Intact sternotomy wires. Stable cardiomediastinal silhouette with mild cardiomegaly. No pneumothorax. No pleural effusion. Extensive patchy opacity throughout the periphery of both lungs, most prominent in the lower left lung. IMPRESSION: Extensive patchy opacity throughout the periphery of both lungs, most prominent in the lower left lung. Findings are worrisome for atypical pneumonia, including viral pneumonia. Electronically  Signed   By: Ilona Sorrel M.D.   On: 08/09/2019 09:40      Assessment/Plan Present on Admission: **None**  COVID19 infection No hypoxia, + cxr changes  Will obtain CRP, ESR, ddimer, ferrintin, IL6 level Continue steroids, albuterol prn  Hypokalemia: replace k   H/o CAD s/p CABG Denies chest pain, troponin negative EKG no acute st/t changes  DVT prophylaxis:  lovenox  Consultants: none  Code Status: full   Family Communication:  Patient   Disposition Plan: admit to [facility]  Time spent: 3mns  FFlorencia ReasonsMD, PhD, FACP [facility] Pager [phone] 7PM-7AM, please  contact night-coverage at [email or web address], password [email or web address]

## 2019-08-10 DIAGNOSIS — J988 Other specified respiratory disorders: Secondary | ICD-10-CM

## 2019-08-10 LAB — COMPREHENSIVE METABOLIC PANEL
ALT: 18 U/L (ref 0–44)
AST: 23 U/L (ref 15–41)
Albumin: 3.2 g/dL — ABNORMAL LOW (ref 3.5–5.0)
Alkaline Phosphatase: 44 U/L (ref 38–126)
Anion gap: 9 (ref 5–15)
BUN: 12 mg/dL (ref 8–23)
CO2: 22 mmol/L (ref 22–32)
Calcium: 8.8 mg/dL — ABNORMAL LOW (ref 8.9–10.3)
Chloride: 105 mmol/L (ref 98–111)
Creatinine, Ser: 0.62 mg/dL (ref 0.61–1.24)
GFR calc Af Amer: >60 mL/min (ref >60)
GFR calc non Af Amer: >60 mL/min (ref >60)
Glucose, Bld: 150 mg/dL — ABNORMAL HIGH (ref 70–99)
Potassium: 4.4 mmol/L (ref 3.5–5.1)
Sodium: 136 mmol/L (ref 135–145)
Total Bilirubin: 0.9 mg/dL (ref 0.3–1.2)
Total Protein: 7.7 g/dL (ref 6.5–8.1)

## 2019-08-10 LAB — URINALYSIS, ROUTINE W REFLEX MICROSCOPIC
Bacteria, UA: NONE SEEN
Bilirubin Urine: NEGATIVE
Glucose, UA: 50 mg/dL — AB
Hgb urine dipstick: NEGATIVE
Ketones, ur: NEGATIVE mg/dL
Leukocytes,Ua: NEGATIVE
Nitrite: NEGATIVE
Protein, ur: 30 mg/dL — AB
Specific Gravity, Urine: 1.027 (ref 1.005–1.030)
pH: 6 (ref 5.0–8.0)

## 2019-08-10 LAB — CBC
HCT: 38 % — ABNORMAL LOW (ref 39.0–52.0)
Hemoglobin: 12.7 g/dL — ABNORMAL LOW (ref 13.0–17.0)
MCH: 27.6 pg (ref 26.0–34.0)
MCHC: 33.4 g/dL (ref 30.0–36.0)
MCV: 82.6 fL (ref 80.0–100.0)
Platelets: 300 10*3/uL (ref 150–400)
RBC: 4.6 MIL/uL (ref 4.22–5.81)
RDW: 13.1 % (ref 11.5–15.5)
WBC: 6 10*3/uL (ref 4.0–10.5)
nRBC: 0 % (ref 0.0–0.2)

## 2019-08-10 LAB — FERRITIN: Ferritin: 369 ng/mL — ABNORMAL HIGH (ref 24–336)

## 2019-08-10 LAB — D-DIMER, QUANTITATIVE: D-Dimer, Quant: 2.33 ug/mL-FEU — ABNORMAL HIGH (ref 0.00–0.50)

## 2019-08-10 LAB — C-REACTIVE PROTEIN: CRP: 14.7 mg/dL — ABNORMAL HIGH (ref <1.0)

## 2019-08-10 MED ORDER — ATORVASTATIN CALCIUM 10 MG PO TABS
20.0000 mg | ORAL_TABLET | Freq: Every day | ORAL | Status: DC
  Administered 2019-08-10 – 2019-08-11 (×2): 20 mg via ORAL
  Filled 2019-08-10 (×2): qty 2

## 2019-08-10 MED ORDER — METOPROLOL SUCCINATE ER 25 MG PO TB24
25.0000 mg | ORAL_TABLET | Freq: Every day | ORAL | Status: DC
  Administered 2019-08-10 – 2019-08-12 (×3): 25 mg via ORAL
  Filled 2019-08-10 (×3): qty 1

## 2019-08-10 MED ORDER — ASPIRIN 81 MG PO CHEW
81.0000 mg | CHEWABLE_TABLET | Freq: Every day | ORAL | Status: DC
  Administered 2019-08-10 – 2019-08-12 (×3): 81 mg via ORAL
  Filled 2019-08-10 (×3): qty 1

## 2019-08-10 NOTE — Evaluation (Signed)
Physical Therapy Evaluation Patient Details Name: Roger Howe MRN: [id number] DOB: 09-12-1956 Today's Date: 08/10/2019   History of Present Illness  Pt adm with SOB and Covid. Chest xray worrisome for atypical PNA. PMH - CAD, CABG  Clinical Impression  Pt admitted with above diagnosis and presents to PT with functional limitations due to deficits listed below (See PT problem list). Pt needs skilled PT to maximize independence and safety to allow discharge to home with friend and HHPT.      Follow Up Recommendations Home health PT;Supervision for mobility/OOB    Equipment Recommendations  Other (comment)(To be determined)    Recommendations for Other Services       Precautions / Restrictions Precautions Precautions: Fall Restrictions Weight Bearing Restrictions: No      Mobility  Bed Mobility Overal bed mobility: Modified Independent             General bed mobility comments: Incr time  Transfers Overall transfer level: Needs assistance Equipment used: None Transfers: Sit to/from Stand Sit to Stand: Min assist         General transfer comment: Assist for balance  Ambulation/Gait Ambulation/Gait assistance: Min assist Gait Distance (Feet): 220 Feet Assistive device: None Gait Pattern/deviations: Step-through pattern;Decreased stride length;Drifts right/left Gait velocity: decr Gait velocity interpretation: 1.31 - 2.62 ft/sec, indicative of limited community ambulator General Gait Details: Unsteady gait requiring assist for balance. Pt didn't want to use walker. Amb on RA with SpO2 >90%  Stairs            Wheelchair Mobility    Modified Rankin (Stroke Patients Only)       Balance Overall balance assessment: Needs assistance Sitting-balance support: No upper extremity supported;Feet supported Sitting balance-Leahy Scale: Good     Standing balance support: No upper extremity supported;During functional activity Standing balance-Leahy Scale:  Fair Standing balance comment: Assist for any dynamic activity                             Pertinent Vitals/Pain Pain Assessment: No/denies pain    Home Living Family/patient expects to be discharged to:: Private residence Living Arrangements: Non-relatives/Friends Available Help at Discharge: Friend(s);Available 24 hours/day   Home Access: Other (comment)(Unsure. Even with interpreter difficult to get this info)     Home Layout: One level        Prior Function Level of Independence: Independent               Hand Dominance        Extremity/Trunk Assessment   Upper Extremity Assessment Upper Extremity Assessment: Generalized weakness    Lower Extremity Assessment Lower Extremity Assessment: Generalized weakness       Communication   Communication: Prefers language other than English;Interpreter utilized  Cognition Arousal/Alertness: Awake/alert Behavior During Therapy: WFL for tasks assessed/performed Overall Cognitive Status: Within Functional Limits for tasks assessed                                 General Comments: Appears functional. Followed what interpreter said/asked      General Comments General comments (skin integrity, edema, etc.): Video interpreter Caren Griffins [phone] used throughout    Exercises     Assessment/Plan    PT Assessment Patient needs continued PT services  PT Problem List Decreased strength;Decreased activity tolerance;Decreased balance;Decreased mobility       PT Treatment Interventions DME instruction;Gait training;Functional mobility training;Therapeutic exercise;Therapeutic activities;Balance  training;Patient/family education    PT Goals (Current goals can be found in the Care Plan section)  Acute Rehab PT Goals Patient Stated Goal: not stated PT Goal Formulation: With patient Time For Goal Achievement: 08/24/19 Potential to Achieve Goals: Good    Frequency Min 3X/week   Barriers to  discharge        Co-evaluation               AM-PAC PT "6 Clicks" Mobility  Outcome Measure Help needed turning from your back to your side while in a flat bed without using bedrails?: None Help needed moving from lying on your back to sitting on the side of a flat bed without using bedrails?: None Help needed moving to and from a bed to a chair (including a wheelchair)?: A Little Help needed standing up from a chair using your arms (e.g., wheelchair or bedside chair)?: A Little Help needed to walk in hospital room?: A Little Help needed climbing 3-5 steps with a railing? : A Little 6 Click Score: 20    End of Session   Activity Tolerance: Patient tolerated treatment well Patient left: in bed;with call bell/phone within reach(bed alarm not functioning)   PT Visit Diagnosis: Unsteadiness on feet (R26.81);Muscle weakness (generalized) (M62.81)    Time: 9735-3299 PT Time Calculation (min) (ACUTE ONLY): 22 min   Charges:   PT Evaluation $PT Eval Moderate Complexity: 1 Mod          [facility] PT Acute Rehabilitation Services Pager [phone] Office [phone]   Angelina Ok [facility] 08/10/2019, 2:23 PM

## 2019-08-10 NOTE — Plan of Care (Signed)
  Problem: Education: Goal: Knowledge of General Education information will improve Description: Including pain rating scale, medication(s)/side effects and non-pharmacologic comfort measures Outcome: Progressing   Problem: Health Behavior/Discharge Planning: Goal: Ability to manage health-related needs will improve Outcome: Progressing   Problem: Clinical Measurements: Goal: Ability to maintain clinical measurements within normal limits will improve Outcome: Progressing Goal: Will remain free from infection Outcome: Progressing Goal: Diagnostic test results will improve Outcome: Progressing Goal: Respiratory complications will improve Outcome: Progressing   Problem: Activity: Goal: Risk for activity intolerance will decrease Outcome: Progressing   Problem: Nutrition: Goal: Adequate nutrition will be maintained Outcome: Progressing   Problem: Safety: Goal: Ability to remain free from injury will improve Outcome: Progressing   Problem: Skin Integrity: Goal: Risk for impaired skin integrity will decrease Outcome: Progressing   Problem: Coping: Goal: Level of anxiety will decrease Outcome: Completed/Met   Problem: Elimination: Goal: Will not experience complications related to bowel motility Outcome: Completed/Met Goal: Will not experience complications related to urinary retention Outcome: Completed/Met   Problem: Pain Managment: Goal: General experience of comfort will improve Outcome: Completed/Met

## 2019-08-10 NOTE — Progress Notes (Addendum)
PROGRESS NOTE                                                                                                                                                                                                             Patient Demographics:    Roger Howe, is a 63 y.o. male, DOB - 1956-06-05, ZOX:[id number]  Outpatient Primary MD for the patient is Patient, No Pcp Per   Admit date - 08/09/2019   LOS - 1  Chief Complaint  Patient presents with  . Shortness of Breath       Brief Narrative: Patient is a 63 y.o. male with PMHx of CAD s/p CABG, HTN-presented with cough and worsening shortness of breath x1 week-found to have COVID-19 pneumonia on imaging studies-and subsequently [facility] to [facility].   Subjective:    Roger Howe today feels slightly better-he continues to cough-has no shortness of breath at rest.  No nausea.   Note-rounded with iPad/Electronic translator at bedside   Assessment  & Plan :   Covid 19 Viral pneumonia: Although has pneumonia on chest x-ray-does not appear to be hypoxic.  Inflammatory markers are significantly elevated and patient remains on IV steroids.  Have asked nursing staff to let us know if patient starts requiring oxygen.  Ambulate with therapy services.  Fever: afebrile  O2 requirements: None  COVID-19 Labs: Recent Labs    08/09/19 1216 08/09/19 1245 08/10/19 0230  DDIMER  --  2.08* 2.33*  FERRITIN 360*  --  369*  LDH  --  232*  --   CRP 11.4*  --  14.7*    COVID-19 Medications: Steroids:8/8>> Remdesivir:Not given due to lack of hypoxia Actemra:Does not meet criteria Convalescent Plasma:N/A Research Studies:  Other medications: Diuretics: No indication as euvolemic Antibiotics: None-as no evidence of bacterial infection  CAD s/p CABG: No anginal symptoms-continue aspirin, statin and beta-blocker  HTN: Blood pressure control but slightly on the higher  side-resume metoprolol-hold off on starting other antihypertensives  ABG: No results found for: PHART, PCO2ART, PO2ART, HCO3, TCO2, ACIDBASEDEF, O2SAT  Vent Settings:  Condition -Stable  Family Communication  : Left voicemail for family friend  Code Status :  Full Code  Diet :  Diet Order            Diet Heart Room service appropriate? Yes; Fluid consistency: Thin  Diet effective now               Disposition Plan  :  Remain hospitalized-if remains stable and without hypoxia-tentatively could go home on 8/11  Consults  :  None  Procedures  :  None  DVT Prophylaxis  :  Lovenox  Lab Results  Component Value Date   PLT 300 08/10/2019    Antibiotics  :    Anti-infectives (From admission, onward)   None      Inpatient Medications  Scheduled Meds: . benzonatate  200 mg Oral TID  . enoxaparin (LOVENOX) injection  40 mg Subcutaneous Q24H  . methylPREDNISolone (SOLU-MEDROL) injection  40 mg Intravenous Q12H   Continuous Infusions: PRN Meds:.acetaminophen, albuterol, chlorpheniramine-HYDROcodone, ondansetron (ZOFRAN) IV   Time Spent in minutes  25   See all Orders from today for further details   Oren Binet M.D on 08/10/2019 at 11:55 AM  To page go to [email or web address] - use universal password  [facility] -  Office  [phone]    Objective:   Vitals:   08/09/19 1937 08/10/19 0404 08/10/19 0800 08/10/19 0824  BP: 132/81 125/84  136/86  Pulse: 75 65 65 69  Resp: (!) 33 (!) 28  (!) 28  Temp: 99.1 F (37.3 C) 97.7 F (36.5 C)  (!) 97.4 F (36.3 C)  TempSrc: Oral Oral  Oral  SpO2: 97% 97%  96%  Weight:      Height:        Wt Readings from Last 3 Encounters:  08/09/19 87.5 kg     Intake/Output Summary (Last 24 hours) at 08/10/2019 1155 Last data filed at 08/10/2019 1118 Gross per 24 hour  Intake 240 ml  Output 150 ml  Net 90 ml     Physical Exam Gen Exam:Alert awake-not in any distress HEENT:atraumatic, normocephalic Chest:  B/L clear to auscultation anteriorly CVS:S1S2 regular Abdomen:soft non tender, non distended Extremities:no edema Neurology: Non focal Skin: no rash   Data Review:    CBC Recent Labs  Lab 08/09/19 0837 08/10/19 0230  WBC 9.2 6.0  HGB 12.7* 12.7*  HCT 37.9* 38.0*  PLT 284 300  MCV 82.4 82.6  MCH 27.6 27.6  MCHC 33.5 33.4  RDW 13.2 13.1  LYMPHSABS 1.7  --   MONOABS 1.4*  --   EOSABS 0.1  --   BASOSABS 0.0  --     Chemistries  Recent Labs  Lab 08/09/19 0837 08/10/19 0230  NA 136 136  K 3.3* 4.4  CL 104 105  CO2 22 22  GLUCOSE 106* 150*  BUN 12 12  CREATININE 0.76 0.62  CALCIUM 8.5* 8.8*  AST 30 23  ALT 21 18  ALKPHOS 51 44  BILITOT 0.9 0.9   ------------------------------------------------------------------------------------------------------------------ Recent Labs    08/09/19 1216  TRIG 97    No results found for: HGBA1C ------------------------------------------------------------------------------------------------------------------ No results for input(s): TSH, T4TOTAL, T3FREE, THYROIDAB in the last 72 hours.  Invalid input(s): FREET3 ------------------------------------------------------------------------------------------------------------------ Recent Labs    08/09/19 1216 08/10/19 0230  FERRITIN 360* 369*    Coagulation profile No results for input(s): INR, PROTIME in the last 168 hours.  Recent Labs    08/09/19 1245 08/10/19 0230  DDIMER 2.08* 2.33*    Cardiac Enzymes No results for input(s): CKMB, TROPONINI, MYOGLOBIN in the last 168 hours.  Invalid input(s): CK ------------------------------------------------------------------------------------------------------------------ No results found for: BNP  Micro Results Recent Results (from the past 240 hour(s))  SARS Coronavirus 2 [facility] order, Performed in [facility]  lab) Nasopharyngeal Nasopharyngeal Swab     Status: Abnormal   Collection Time: 08/09/19  8:38 AM    Specimen: Nasopharyngeal Swab  Result Value Ref Range Status   SARS Coronavirus 2 POSITIVE (A) NEGATIVE Final    Comment: RESULT CALLED TO, READ BACK BY AND VERIFIED WITH: K.SIMPSON,RN [phone] @1102  BY V.WILKINS (NOTE) If result is NEGATIVE SARS-CoV-2 target nucleic acids are NOT DETECTED. The SARS-CoV-2 RNA is generally detectable in upper and lower  respiratory specimens during the acute phase of infection. The lowest  concentration of SARS-CoV-2 viral copies this assay can detect is 250  copies / mL. A negative result does not preclude SARS-CoV-2 infection  and should not be used as the sole basis for treatment or other  patient management decisions.  A negative result may occur with  improper specimen collection / handling, submission of specimen other  than nasopharyngeal swab, presence of viral mutation(s) within the  areas targeted by this assay, and inadequate number of viral copies  (<250 copies / mL). A negative result must be combined with clinical  observations, patient history, and epidemiological information. If result is POSITIVE SARS-CoV-2 target nucleic acids are DETECTED.  The SARS-CoV-2 RNA is generally detectable in upper and lower  respiratory specimens during the acute phase of infection.  Positive  results are indicative of active infection with SARS-CoV-2.  Clinical  correlation with patient history and other diagnostic information is  necessary to determine patient infection status.  Positive results do  not rule out bacterial infection or co-infection with other viruses. If result is PRESUMPTIVE POSTIVE SARS-CoV-2 nucleic acids MAY BE PRESENT.   A presumptive positive result was obtained on the submitted specimen  and confirmed on repeat testing.  While 2019 novel coronavirus  (SARS-CoV-2) nucleic acids may be present in the submitted sample  additional confirmatory testing may be necessary for epidemiological  and / or clinical management purposes  to  differentiate between  SARS-CoV-2 and other Sarbecovirus currently known to infect humans.  If clinically indicated additional testing with an alternate test  methodology [phone]  is advised. The SARS-CoV-2 RNA is generally  detectable in upper and lower respiratory specimens during the acute  phase of infection. The expected result is Negative. Fact Sheet for Patients:  BoilerBrush.com.cyhttps://www.fda.gov/media/136312/download Fact Sheet for Healthcare Providers: https://pope.com/https://www.fda.gov/media/136313/download This test is not yet approved or cleared by the [place] FDA and has been authorized for detection and/or diagnosis of SARS-CoV-2 by FDA under an Emergency Use Authorization (EUA).  This EUA will remain in effect (meaning this test can be used) for the duration of the COVID-19 declaration under Section 564(b)(1) of the Act, 21 U.S.C. section 360bbb-3(b)(1), unless the authorization is terminated or revoked sooner. Performed at [facility], [address] [place], [place] [address]   Culture, blood (routine x 2)     Status: None (Preliminary result)   Collection Time: 08/09/19  8:38 AM   Specimen: BLOOD  Result Value Ref Range Status   Specimen Description   Final    BLOOD RIGHT WRIST Performed at [facility], [address] [place], [place] [address]    Special Requests   Final    BOTTLES DRAWN AEROBIC AND ANAEROBIC Blood Culture adequate volume Performed at [facility], [address] [place], [place] [address]    Culture   Final    NO GROWTH 1 DAY Performed at [facility], [address] [place], [place] [address]    Report  Status PENDING  Incomplete  Culture, blood (routine x 2)     Status: None (Preliminary result)   Collection Time: 08/09/19  8:43 AM   Specimen: BLOOD  Result Value Ref Range Status   Specimen Description BLOOD LEFT ANTECUBITAL  Final   Special Requests   Final    BOTTLES DRAWN AEROBIC AND  ANAEROBIC Blood Culture adequate volume   Culture   Final    NO GROWTH 1 DAY Performed at [facility], [address], [place], [place] [address]    Report Status PENDING  Incomplete    Radiology Reports Dg Chest Portable 1 View  Result Date: 08/09/2019 CLINICAL DATA:  Dyspnea EXAM: PORTABLE CHEST 1 VIEW COMPARISON:  06/14/2014 chest radiograph. FINDINGS: Intact sternotomy wires. Stable cardiomediastinal silhouette with mild cardiomegaly. No pneumothorax. No pleural effusion. Extensive patchy opacity throughout the periphery of both lungs, most prominent in the lower left lung. IMPRESSION: Extensive patchy opacity throughout the periphery of both lungs, most prominent in the lower left lung. Findings are worrisome for atypical pneumonia, including viral pneumonia. Electronically Signed   By: Delbert Phenix M.D.   On: 08/09/2019 09:40

## 2019-08-11 LAB — C-REACTIVE PROTEIN: CRP: 6.1 mg/dL — ABNORMAL HIGH (ref <1.0)

## 2019-08-11 LAB — FERRITIN: Ferritin: 367 ng/mL — ABNORMAL HIGH (ref 24–336)

## 2019-08-11 LAB — INTERLEUKIN-6, PLASMA: Interleukin-6, Plasma: 85.3 pg/mL — ABNORMAL HIGH (ref 0.0–12.2)

## 2019-08-11 LAB — HIV ANTIBODY (ROUTINE TESTING W REFLEX): HIV Screen 4th Generation wRfx: NONREACTIVE

## 2019-08-11 LAB — URINE CULTURE
Culture: <10000 — AB
Special Requests: NORMAL

## 2019-08-11 LAB — D-DIMER, QUANTITATIVE: D-Dimer, Quant: 1.84 ug/mL-FEU — ABNORMAL HIGH (ref 0.00–0.50)

## 2019-08-11 NOTE — Progress Notes (Signed)
Physical Therapy Treatment Patient Details Name: Roger Howe MRN: [id number] DOB: Mar 26, 1956 Today's Date: 08/11/2019    History of Present Illness Pt adm with SOB and Covid. Chest xray worrisome for atypical PNA. PMH - CAD, CABG    PT Comments    Pt making steady progress. Gait improving. In home environment expect pt will be able to mobilize without assistance. Instructed pt to have supervision if ambulating outside. Pt agrees. Pt does not want to use a walker. Pt amb on RA with SpO2 95%.    Follow Up Recommendations  Home health PT;Supervision for mobility/OOB     Equipment Recommendations  None recommended by PT    Recommendations for Other Services       Precautions / Restrictions Precautions Precautions: Fall Restrictions Weight Bearing Restrictions: No    Mobility  Bed Mobility Overal bed mobility: Modified Independent             General bed mobility comments: Incr time and use of rail  Transfers Overall transfer level: Needs assistance Equipment used: None Transfers: Sit to/from Stand Sit to Stand: Supervision         General transfer comment: Pt able to self correct initial posterior lean on standing  Ambulation/Gait Ambulation/Gait assistance: Supervision;Min guard Gait Distance (Feet): 350 Feet Assistive device: None Gait Pattern/deviations: Step-through pattern;Decreased stride length;Drifts right/left Gait velocity: decr Gait velocity interpretation: 1.31 - 2.62 ft/sec, indicative of limited community ambulator General Gait Details: Assist for safety. In small environment of room supervision. In larger environment of hallway min guard. Pt with some instability but able to self correct   Stairs             Wheelchair Mobility    Modified Rankin (Stroke Patients Only)       Balance Overall balance assessment: Needs assistance Sitting-balance support: No upper extremity supported;Feet supported Sitting balance-Leahy Scale: Good      Standing balance support: No upper extremity supported;During functional activity Standing balance-Leahy Scale: Fair Standing balance comment: supervision for dynamic                            Cognition Arousal/Alertness: Awake/alert Behavior During Therapy: WFL for tasks assessed/performed Overall Cognitive Status: Within Functional Limits for tasks assessed                                 General Comments: Appears functional. Followed what interpreter said/asked      Exercises      General Comments General comments (skin integrity, edema, etc.): video interpreter Roger Howe [phone] used throughout      Pertinent Vitals/Pain Pain Assessment: No/denies pain    Home Living                      Prior Function            PT Goals (current goals can now be found in the care plan section) Progress towards PT goals: Progressing toward goals    Frequency    Min 3X/week      PT Plan Current plan remains appropriate    Co-evaluation              AM-PAC PT "6 Clicks" Mobility   Outcome Measure  Help needed turning from your back to your side while in a flat bed without using bedrails?: None Help needed moving from lying on your back to  sitting on the side of a flat bed without using bedrails?: None Help needed moving to and from a bed to a chair (including a wheelchair)?: A Little Help needed standing up from a chair using your arms (e.g., wheelchair or bedside chair)?: A Little Help needed to walk in hospital room?: A Little Help needed climbing 3-5 steps with a railing? : A Little 6 Click Score: 20    End of Session   Activity Tolerance: Patient tolerated treatment well Patient left: in bed;with call bell/phone within reach Nurse Communication: Mobility status PT Visit Diagnosis: Unsteadiness on feet (R26.81);Muscle weakness (generalized) (M62.81)     Time: 8185-9093 PT Time Calculation (min) (ACUTE ONLY): 13  min  Charges:  $Gait Training: 8-22 mins                     [facility] PT Acute Rehabilitation Services Pager [phone] Office [phone]    [facility] 08/11/2019, 1:48 PM

## 2019-08-11 NOTE — Progress Notes (Signed)
PROGRESS NOTE                                                                                                                                                                                                             Patient Demographics:    Jacky Rockwell, is a 63 y.o. male, DOB - September 29, 1956, [id number]  Outpatient Primary MD for the patient is Patient, No Pcp Per   Admit date - 08/09/2019   LOS - 2  Chief Complaint  Patient presents with  . Shortness of Breath       Brief Narrative: Patient is a 63 y.o. male with PMHx of CAD s/p CABG, HTN-presented with cough and worsening shortness of breath x1 week-found to have COVID-19 pneumonia on imaging studies-and subsequently transferred to [facility].   Subjective:    Sylvester Buescher feels weak-but denies any nausea vomiting.  She apparently got slightly short of breath when he ambulated with physical therapy yesterday .  He is still not on oxygen.  Note-rounded with iPad/Electronic translator at bedside   Assessment  & Plan :   Covid 19 Viral pneumonia: Stable-not yet on oxygen-inflammatory markers are slowly downtrending.  Given his risk profile/advanced age-suspect prudent to watch him on IV steroids for 1 additional day.  Remains off oxygen-and clinically improved-we will go home on 8/12  Fever: afebrile  O2 requirements: None  COVID-19 Labs: Recent Labs    08/09/19 1216 08/09/19 1245 08/10/19 0230 08/11/19 0245  DDIMER  --  2.08* 2.33* 1.84*  FERRITIN 360*  --  369* 367*  LDH  --  232*  --   --   CRP 11.4*  --  14.7* 6.1*    COVID-19 Medications: Steroids:8/8>> Remdesivir:Not given due to lack of hypoxia Actemra:Does not meet criteria Convalescent Plasma:N/A Research Studies:  Other medications: Diuretics: No indication as euvolemic Antibiotics: None-as no evidence of bacterial infection  CAD s/p CABG: No anginal symptoms-continue aspirin,  statin and beta-blocker  HTN: Controlled-continue metoprolol.  Will resume other antihypertensives over the next few days  ABG: No results found for: PHART, PCO2ART, PO2ART, HCO3, TCO2, ACIDBASEDEF, O2SAT  Vent Settings:  Condition -Stable  Family Communication  : Left voicemail for family friend  Code Status :  Full Code  Diet :  Diet Order  Diet Heart Room service appropriate? Yes; Fluid consistency: Thin  Diet effective now               Disposition Plan  :  Remain hospitalized-if remains stable and without hypoxia-tentatively could go home on 8/12  Consults  :  None  Procedures  :  None  DVT Prophylaxis  :  Lovenox  Lab Results  Component Value Date   PLT 300 08/10/2019    Antibiotics  :    Anti-infectives (From admission, onward)   None      Inpatient Medications  Scheduled Meds: . aspirin  81 mg Oral Daily  . atorvastatin  20 mg Oral q1800  . benzonatate  200 mg Oral TID  . enoxaparin (LOVENOX) injection  40 mg Subcutaneous Q24H  . methylPREDNISolone (SOLU-MEDROL) injection  40 mg Intravenous Q12H  . metoprolol succinate  25 mg Oral Daily   Continuous Infusions: PRN Meds:.acetaminophen, albuterol, chlorpheniramine-HYDROcodone, ondansetron (ZOFRAN) IV   Time Spent in minutes  25   See all Orders from today for further details   Jeoffrey MassedShanker Telisha Zawadzki M.D on 08/11/2019 at 9:32 AM  To page go to [email or web address] - use universal password  [facility] -  Office  [phone]    Objective:   Vitals:   08/10/19 1542 08/10/19 1917 08/11/19 0450 08/11/19 0853  BP:  (!) 152/79 (!) 143/87 131/72  Pulse:  84 65 81  Resp:  (!) 23 (!) 21 (!) 23  Temp: 97.6 F (36.4 C) 97.8 F (36.6 C) (!) 97.4 F (36.3 C) 97.8 F (36.6 C)  TempSrc: Oral Oral Oral Oral  SpO2:  96% 96% 96%  Weight:      Height:        Wt Readings from Last 3 Encounters:  08/09/19 87.5 kg     Intake/Output Summary (Last 24 hours) at 08/11/2019 0932 Last data  filed at 08/10/2019 1922 Gross per 24 hour  Intake 480 ml  Output 150 ml  Net 330 ml     Physical Exam Gen Exam:Alert awake-not in any distress HEENT:atraumatic, normocephalic Chest: B/L clear to auscultation anteriorly CVS:S1S2 regular Abdomen:soft non tender, non distended Extremities:no edema Neurology: Non focal Skin: no rash   Data Review:    CBC Recent Labs  Lab 08/09/19 0837 08/10/19 0230  WBC 9.2 6.0  HGB 12.7* 12.7*  HCT 37.9* 38.0*  PLT 284 300  MCV 82.4 82.6  MCH 27.6 27.6  MCHC 33.5 33.4  RDW 13.2 13.1  LYMPHSABS 1.7  --   MONOABS 1.4*  --   EOSABS 0.1  --   BASOSABS 0.0  --     Chemistries  Recent Labs  Lab 08/09/19 0837 08/10/19 0230  NA 136 136  K 3.3* 4.4  CL 104 105  CO2 22 22  GLUCOSE 106* 150*  BUN 12 12  CREATININE 0.76 0.62  CALCIUM 8.5* 8.8*  AST 30 23  ALT 21 18  ALKPHOS 51 44  BILITOT 0.9 0.9   ------------------------------------------------------------------------------------------------------------------ Recent Labs    08/09/19 1216  TRIG 97    No results found for: HGBA1C ------------------------------------------------------------------------------------------------------------------ No results for input(s): TSH, T4TOTAL, T3FREE, THYROIDAB in the last 72 hours.  Invalid input(s): FREET3 ------------------------------------------------------------------------------------------------------------------ Recent Labs    08/10/19 0230 08/11/19 0245  FERRITIN 369* 367*    Coagulation profile No results for input(s): INR, PROTIME in the last 168 hours.  Recent Labs    08/10/19 0230 08/11/19 0245  DDIMER 2.33* 1.84*    Cardiac Enzymes No results for  input(s): CKMB, TROPONINI, MYOGLOBIN in the last 168 hours.  Invalid input(s): CK ------------------------------------------------------------------------------------------------------------------ No results found for: BNP  Micro Results Recent Results (from  the past 240 hour(s))  SARS Coronavirus 2 [facility] order, Performed in [facility] hospital lab) Nasopharyngeal Nasopharyngeal Swab     Status: Abnormal   Collection Time: 08/09/19  8:38 AM   Specimen: Nasopharyngeal Swab  Result Value Ref Range Status   SARS Coronavirus 2 POSITIVE (A) NEGATIVE Final    Comment: RESULT CALLED TO, READ BACK BY AND VERIFIED WITH: K.SIMPSON,RN [id number] @1102  BY V.WILKINS (NOTE) If result is NEGATIVE SARS-CoV-2 target nucleic acids are NOT DETECTED. The SARS-CoV-2 RNA is generally detectable in upper and lower  respiratory specimens during the acute phase of infection. The lowest  concentration of SARS-CoV-2 viral copies this assay can detect is 250  copies / mL. A negative result does not preclude SARS-CoV-2 infection  and should not be used as the sole basis for treatment or other  patient management decisions.  A negative result may occur with  improper specimen collection / handling, submission of specimen other  than nasopharyngeal swab, presence of viral mutation(s) within the  areas targeted by this assay, and inadequate number of viral copies  (<250 copies / mL). A negative result must be combined with clinical  observations, patient history, and epidemiological information. If result is POSITIVE SARS-CoV-2 target nucleic acids are DETECTED.  The SARS-CoV-2 RNA is generally detectable in upper and lower  respiratory specimens during the acute phase of infection.  Positive  results are indicative of active infection with SARS-CoV-2.  Clinical  correlation with patient history and other diagnostic information is  necessary to determine patient infection status.  Positive results do  not rule out bacterial infection or co-infection with other viruses. If result is PRESUMPTIVE POSTIVE SARS-CoV-2 nucleic acids MAY BE PRESENT.   A presumptive positive result was obtained on the submitted specimen  and confirmed on repeat testing.  While 2019 novel  coronavirus  (SARS-CoV-2) nucleic acids may be present in the submitted sample  additional confirmatory testing may be necessary for epidemiological  and / or clinical management purposes  to differentiate between  SARS-CoV-2 and other Sarbecovirus currently known to infect humans.  If clinically indicated additional testing with an alternate test  methodology [phone])  is advised. The SARS-CoV-2 RNA is generally  detectable in upper and lower respiratory specimens during the acute  phase of infection. The expected result is Negative. Fact Sheet for Patients:  StrictlyIdeas.no Fact Sheet for Healthcare Providers: BankingDealers.co.za This test is not yet approved or cleared by the [place] FDA and has been authorized for detection and/or diagnosis of SARS-CoV-2 by FDA under an Emergency Use Authorization (EUA).  This EUA will remain in effect (meaning this test can be used) for the duration of the COVID-19 declaration under Section 564(b)(1) of the Act, 21 U.S.C. section 360bbb-3(b)(1), unless the authorization is terminated or revoked sooner. Performed at [facility], [place] [address], [place], [place] [address]   Culture, blood (routine x 2)     Status: None (Preliminary result)   Collection Time: 08/09/19  8:38 AM   Specimen: BLOOD  Result Value Ref Range Status   Specimen Description   Final    BLOOD RIGHT WRIST Performed at [facility] [address] [place], [place] [address]    Special Requests   Final    BOTTLES DRAWN AEROBIC AND ANAEROBIC Blood Culture adequate volume Performed at [facility]  [facility], [address] [place], [place] [address]    Culture   Final    NO GROWTH 1 DAY Performed at [facility], [address] [place], [place] [address]    Report Status PENDING  Incomplete  Culture, blood (routine x 2)     Status: None (Preliminary result)    Collection Time: 08/09/19  8:43 AM   Specimen: BLOOD  Result Value Ref Range Status   Specimen Description BLOOD LEFT ANTECUBITAL  Final   Special Requests   Final    BOTTLES DRAWN AEROBIC AND ANAEROBIC Blood Culture adequate volume   Culture   Final    NO GROWTH 1 DAY Performed at [facility], [address] [place], [place] [address]    Report Status PENDING  Incomplete    Radiology Reports Dg Chest Portable 1 View  Result Date: 08/09/2019 CLINICAL DATA:  Dyspnea EXAM: PORTABLE CHEST 1 VIEW COMPARISON:  06/14/2014 chest radiograph. FINDINGS: Intact sternotomy wires. Stable cardiomediastinal silhouette with mild cardiomegaly. No pneumothorax. No pleural effusion. Extensive patchy opacity throughout the periphery of both lungs, most prominent in the lower left lung. IMPRESSION: Extensive patchy opacity throughout the periphery of both lungs, most prominent in the lower left lung. Findings are worrisome for atypical pneumonia, including viral pneumonia. Electronically Signed   By: Delbert PhenixJason A Poff M.D.   On: 08/09/2019 09:40

## 2019-08-12 LAB — D-DIMER, QUANTITATIVE: D-Dimer, Quant: 1.62 ug/mL-FEU — ABNORMAL HIGH (ref 0.00–0.50)

## 2019-08-12 LAB — FERRITIN: Ferritin: 295 ng/mL (ref 24–336)

## 2019-08-12 LAB — C-REACTIVE PROTEIN: CRP: 2.1 mg/dL — ABNORMAL HIGH (ref <1.0)

## 2019-08-12 MED ORDER — BENZONATATE 200 MG PO CAPS: 200.0000 mg | ORAL_CAPSULE | Freq: Three times a day (TID) | ORAL | 0 refills | Status: DC | PRN

## 2019-08-12 MED ORDER — PREDNISONE 10 MG PO TABS: ORAL_TABLET | ORAL | 0 refills | Status: DC

## 2019-08-12 NOTE — TOC Transition Note (Addendum)
Transition of Care [facility]) - CM/SW Discharge Note   Patient Details  Name: Roger Howe MRN: [id number] Date of Birth: 26-Oct-1956  [facility] CM/SW Contact:  Ninfa Meeker, RN Phone Number: [phone] (working remotely)  08/12/2019, 1:58 PM   Clinical Narrative:  63 yr old gentleman admitted and treated for COVID 19. Thankfully he is improving and will discharge home with Home Health and will be transported home via  [place]. Case manager scheduled telephonic hospital follow up call with [place] for Wednesday, August 19,2020 @ 10:50am. Appointment has been placed on AVS. [facility] has been called for transport.     Final next level of care: [place] Barriers to Discharge: No Barriers Identified   Patient Goals and CMS Choice        Discharge Placement                       Discharge Plan and Services   Discharge Planning Services: CM Consult Post Acute Care Choice: Home Health          DME Arranged: N/A         HH Arranged: PT, OT [place] Agency: [place] Date [place]: 08/12/19 Time [place]: [phone] Representative spoke with at [place]: Adela Lank  Social Determinants of Health (SDOH) Interventions     Readmission Risk Interventions No flowsheet data found.

## 2019-08-12 NOTE — Discharge Summary (Signed)
PATIENT DETAILS Name: Roger Howe Age: 63 y.o. Sex: male Date of Birth: 11/06/1956 MRN: [id number]. Admitting Physician: Albertine GratesFang Xu, MD WUJ:[id number]:Patient, No Pcp Per  Admit Date: 08/09/2019 Discharge date: 08/12/2019  Recommendations for Outpatient Follow-up:  1. Follow up with PCP in 1-2 weeks 2. Please obtain BMP/CBC in one week  Admitted From:  Home  Disposition: Home   Home Health: No  Equipment/Devices: None  Discharge Condition: Stable  CODE STATUS: FULL CODE  Diet recommendation:  Diet Order            Diet - low sodium heart healthy        Diet Heart Room service appropriate? Yes; Fluid consistency: Thin  Diet effective now               Brief Summary: See H&P, Labs, Consult and Test reports for all details in brief,Patient is a 63 y.o. male with PMHx of CAD s/p CABG, HTN-presented with cough and worsening shortness of breath x1 week-found to have COVID-19 pneumonia on imaging studies-and [facility] Covid 19 Viral pneumonia: Stable-did not require O2. Inflammatory markers elevated, but downtrending with IV steroids. Since has been stable with close observation in the hospital for 2 days now, should be stable to go home on a quick steroid taper.   Fever: afebrile  O2 requirements: None  COVID-19 Labs:  Recent Labs    08/09/19 1245 08/10/19 0230 08/11/19 0245 08/12/19 0242  DDIMER 2.08* 2.33* 1.84* 1.62*  FERRITIN  --  369* 367* 295  LDH 232*  --   --   --   CRP  --  14.7* 6.1* 2.1*    Lab Results  Component Value Date   SARSCOV2NAA POSITIVE (A) 08/09/2019     COVID-19 Medications: Steroids:8/8>> Remdesivir:Not given due to lack of hypoxia Actemra:Does not meet criteria Convalescent Plasma:N/A Research Studies:  Other medications: Diuretics: No indication as euvolemic Antibiotics: None-as no evidence of bacterial infection  CAD s/p CABG: No anginal symptoms-continue  aspirin, statin and beta-blocker  HTN: Controlled-but slowly creeping up,.resume all antihypertensives on dishcarge  Procedures/Studies: None  Discharge Diagnoses:  Active Problems:   COVID-19   COVID-19 virus detected   Hypokalemia   Hx of CABG   Coronary artery disease involving native coronary artery of native heart without angina pectoris   Discharge Instructions:  Activity:  As tolerated  Discharge Instructions    Call MD for:  extreme fatigue   Complete by: As directed    Call MD for:  persistant nausea and vomiting   Complete by: As directed    Call MD for:  temperature >100.4   Complete by: As directed    Diet - low sodium heart healthy   Complete by: As directed    Discharge instructions   Complete by: As directed    Follow with Primary MD   in 1 weeks  Please get a complete blood count and chemistry panel checked by your Primary MD at your next visit, and again as instructed by your Primary MD.  Get Medicines reviewed and adjusted: Please take all your medications with you for your next visit with your Primary MD  Laboratory/radiological data: Please request your Primary MD to go over all hospital tests and procedure/radiological results at the follow up, please ask your Primary MD to get all Hospital records sent to his/her office.  In some cases, they will be blood work, cultures and biopsy results pending at the time of  your discharge. Please request that your primary care M.D. follows up on these results.  Also Note the following: If you experience worsening of your admission symptoms, develop shortness of breath, life threatening emergency, suicidal or homicidal thoughts you must seek medical attention immediately by calling 911 or calling your MD immediately  if symptoms less severe.  You must read complete instructions/literature along with all the possible adverse reactions/side effects for all the Medicines you take and that have been prescribed to  you. Take any new Medicines after you have completely understood and accpet all the possible adverse reactions/side effects.   Do not drive when taking Pain medications or sleeping medications (Benzodaizepines)  Do not take more than prescribed Pain, Sleep and Anxiety Medications. It is not advisable to combine anxiety,sleep and pain medications without talking with your primary care practitioner  Special Instructions: If you have smoked or chewed Tobacco  in the last 2 yrs please stop smoking, stop any regular Alcohol  and or any Recreational drug use.  Wear Seat belts while driving.  Please note: You were cared for by a hospitalist during your hospital stay. Once you are discharged, your primary care physician will handle any further medical issues. Please note that NO REFILLS for any discharge medications will be authorized once you are discharged, as it is imperative that you return to your primary care physician (or establish a relationship with a primary care physician if you do not have one) for your post hospital discharge needs so that they can reassess your need for medications and monitor your lab values.  ?   Person Under Monitoring Name: Roger Howe  Location: [facility] [place] [address]   Infection Prevention Recommendations for Individuals Confirmed to have, or Being Evaluated for, 2019 Novel Coronavirus (COVID-19) Infection Who Receive Care at Home  Individuals who are confirmed to have, or are being evaluated for, COVID-19 should follow the prevention steps below until a healthcare provider or local or state health department says they can return to normal activities.  Stay home except to get medical care You should restrict activities outside your home, except for getting medical care. Do not go to work, school, or public areas, and do not use public transportation or taxis.  Call ahead before visiting your doctor Before your medical appointment, call the  healthcare provider and tell them that you have, or are being evaluated for, COVID-19 infection. This will help the healthcare provider's office take steps to keep other people from getting infected. Ask your healthcare provider to call the local or state health department.  Monitor your symptoms Seek prompt medical attention if your illness is worsening (e.g., difficulty breathing). Before going to your medical appointment, call the healthcare provider and tell them that you have, or are being evaluated for, COVID-19 infection. Ask your healthcare provider to call the local or state health department.  Wear a facemask You should wear a facemask that covers your nose and mouth when you are in the same room with other people and when you visit a healthcare provider. People who live with or visit you should also wear a facemask while they are in the same room with you.  Separate yourself from other people in your home As much as possible, you should stay in a different room from other people in your home. Also, you should use a separate bathroom, if available.  Avoid sharing household items You should not share dishes, drinking glasses, cups, eating utensils, towels, bedding, or  other items with other people in your home. After using these items, you should wash them thoroughly with soap and water.  Cover your coughs and sneezes Cover your mouth and nose with a tissue when you cough or sneeze, or you can cough or sneeze into your sleeve. Throw used tissues in a lined trash can, and immediately wash your hands with soap and water for at least 20 seconds or use an alcohol-based hand rub.  Wash your Union Pacific Corporationhands Wash your hands often and thoroughly with soap and water for at least 20 seconds. You can use an alcohol-based hand sanitizer if soap and water are not available and if your hands are not visibly dirty. Avoid touching your eyes, nose, and mouth with unwashed hands.   Prevention Steps for  Caregivers and Household Members of Individuals Confirmed to have, or Being Evaluated for, COVID-19 Infection Being Cared for in the Home  If you live with, or provide care at home for, a person confirmed to have, or being evaluated for, COVID-19 infection please follow these guidelines to prevent infection:  Follow healthcare provider's instructions Make sure that you understand and can help the patient follow any healthcare provider instructions for all care.  Provide for the patient's basic needs You should help the patient with basic needs in the home and provide support for getting groceries, prescriptions, and other personal needs.  Monitor the patient's symptoms If they are getting sicker, call his or her medical provider and tell them that the patient has, or is being evaluated for, COVID-19 infection. This will help the healthcare provider's office take steps to keep other people from getting infected. Ask the healthcare provider to call the local or state health department.  Limit the number of people who have contact with the patient If possible, have only one caregiver for the patient. Other household members should stay in another home or place of residence. If this is not possible, they should stay in another room, or be separated from the patient as much as possible. Use a separate bathroom, if available. Restrict visitors who do not have an essential need to be in the home.  Keep older adults, very young children, and other sick people away from the patient Keep older adults, very young children, and those who have compromised immune systems or chronic health conditions away from the patient. This includes people with chronic heart, lung, or kidney conditions, diabetes, and cancer.  Ensure good ventilation Make sure that shared spaces in the home have good air flow, such as from an air conditioner or an opened window, weather permitting.  Wash your hands often Wash  your hands often and thoroughly with soap and water for at least 20 seconds. You can use an alcohol based hand sanitizer if soap and water are not available and if your hands are not visibly dirty. Avoid touching your eyes, nose, and mouth with unwashed hands. Use disposable paper towels to dry your hands. If not available, use dedicated cloth towels and replace them when they become wet.  Wear a facemask and gloves Wear a disposable facemask at all times in the room and gloves when you touch or have contact with the patient's blood, body fluids, and/or secretions or excretions, such as sweat, saliva, sputum, nasal mucus, vomit, urine, or feces.  Ensure the mask fits over your nose and mouth tightly, and do not touch it during use. Throw out disposable facemasks and gloves after using them. Do not reuse. Wash your hands immediately  after removing your facemask and gloves. If your personal clothing becomes contaminated, carefully remove clothing and launder. Wash your hands after handling contaminated clothing. Place all used disposable facemasks, gloves, and other waste in a lined container before disposing them with other household waste. Remove gloves and wash your hands immediately after handling these items.  Do not share dishes, glasses, or other household items with the patient Avoid sharing household items. You should not share dishes, drinking glasses, cups, eating utensils, towels, bedding, or other items with a patient who is confirmed to have, or being evaluated for, COVID-19 infection. After the person uses these items, you should wash them thoroughly with soap and water.  Wash laundry thoroughly Immediately remove and wash clothes or bedding that have blood, body fluids, and/or secretions or excretions, such as sweat, saliva, sputum, nasal mucus, vomit, urine, or feces, on them. Wear gloves when handling laundry from the patient. Read and follow directions on labels of laundry or  clothing items and detergent. In general, wash and dry with the warmest temperatures recommended on the label.  Clean all areas the individual has used often Clean all touchable surfaces, such as counters, tabletops, doorknobs, bathroom fixtures, toilets, phones, keyboards, tablets, and bedside tables, every day. Also, clean any surfaces that may have blood, body fluids, and/or secretions or excretions on them. Wear gloves when cleaning surfaces the patient has come in contact with. Use a diluted bleach solution (e.g., dilute bleach with 1 part bleach and 10 parts water) or a household disinfectant with a label that says EPA-registered for coronaviruses. To make a bleach solution at home, add 1 tablespoon of bleach to 1 quart (4 cups) of water. For a larger supply, add  cup of bleach to 1 gallon (16 cups) of water. Read labels of cleaning products and follow recommendations provided on product labels. Labels contain instructions for safe and effective use of the cleaning product including precautions you should take when applying the product, such as wearing gloves or eye protection and making sure you have good ventilation during use of the product. Remove gloves and wash hands immediately after cleaning.  Monitor yourself for signs and symptoms of illness Caregivers and household members are considered close contacts, should monitor their health, and will be asked to limit movement outside of the home to the extent possible. Follow the monitoring steps for close contacts listed on the symptom monitoring form.   ? If you have additional questions, contact your local health department or call the epidemiologist on call at [phone] (available 24/7). ? This guidance is subject to change. For the most up-to-date guidance from CDC, please refer to their website: TripMetro.hu   Increase activity slowly   Complete by: As directed       Allergies as of 08/12/2019   No Known Allergies     Medication List    TAKE these medications   aspirin 81 MG chewable tablet Chew 81 mg by mouth daily.   atorvastatin 20 MG tablet Commonly known as: LIPITOR Take 20 mg by mouth daily at 6 PM.   benzonatate 200 MG capsule Commonly known as: TESSALON Take 1 capsule (200 mg total) by mouth 3 (three) times daily as needed for cough.   fenofibrate micronized 134 MG capsule Commonly known as: LOFIBRA Take 134 mg by mouth daily.   lisinopril-hydrochlorothiazide 10-12.5 MG tablet Commonly known as: ZESTORETIC Take 1 tablet by mouth daily.   metoprolol succinate 25 MG 24 hr tablet Commonly known as: TOPROL-XL Take 25  mg by mouth daily.   predniSONE 10 MG tablet Commonly known as: DELTASONE Take 30 mg daily for 1 day, 20 mg daily for 1 day, 10 mg daily for 1 day, then stop      Follow-up Information    Primary Care MD. Schedule an appointment as soon as possible for a visit in 1 week(s).          No Known Allergies    Consultations:   None   Other Procedures/Studies: Dg Chest Portable 1 View  Result Date: 08/09/2019 CLINICAL DATA:  Dyspnea EXAM: PORTABLE CHEST 1 VIEW COMPARISON:  06/14/2014 chest radiograph. FINDINGS: Intact sternotomy wires. Stable cardiomediastinal silhouette with mild cardiomegaly. No pneumothorax. No pleural effusion. Extensive patchy opacity throughout the periphery of both lungs, most prominent in the lower left lung. IMPRESSION: Extensive patchy opacity throughout the periphery of both lungs, most prominent in the lower left lung. Findings are worrisome for atypical pneumonia, including viral pneumonia. Electronically Signed   By: Delbert Phenix M.D.   On: 08/09/2019 09:40      TODAY-DAY OF DISCHARGE:  Subjective:   Roger Howe today has no headache,no chest abdominal pain,no new weakness tingling or numbness, feels much better wants to go home today.   Objective:   Blood pressure (!)  163/94, pulse 64, temperature 97.6 F (36.4 C), temperature source Oral, resp. rate 19, height 5' 8.4" (1.737 m), weight 87.5 kg, SpO2 97 %.  Intake/Output Summary (Last 24 hours) at 08/12/2019 0930 Last data filed at 08/11/2019 1800 Gross per 24 hour  Intake 720 ml  Output -  Net 720 ml   Filed Weights   08/09/19 0813  Weight: 87.5 kg    Exam: Awake Alert, Oriented *3, No new F.N deficits, Normal affect [place].AT,PERRAL Supple Neck,No JVD, No cervical lymphadenopathy appriciated.  Symmetrical Chest wall movement, Good air movement bilaterally, CTAB RRR,No Gallops,Rubs or new Murmurs, No Parasternal Heave +ve B.Sounds, Abd Soft, Non tender, No organomegaly appriciated, No rebound -guarding or rigidity. No Cyanosis, Clubbing or edema, No new Rash or bruise   PERTINENT RADIOLOGIC STUDIES: Dg Chest Portable 1 View  Result Date: 08/09/2019 CLINICAL DATA:  Dyspnea EXAM: PORTABLE CHEST 1 VIEW COMPARISON:  06/14/2014 chest radiograph. FINDINGS: Intact sternotomy wires. Stable cardiomediastinal silhouette with mild cardiomegaly. No pneumothorax. No pleural effusion. Extensive patchy opacity throughout the periphery of both lungs, most prominent in the lower left lung. IMPRESSION: Extensive patchy opacity throughout the periphery of both lungs, most prominent in the lower left lung. Findings are worrisome for atypical pneumonia, including viral pneumonia. Electronically Signed   By: Delbert Phenix M.D.   On: 08/09/2019 09:40     PERTINENT LAB RESULTS: CBC: Recent Labs    08/10/19 0230  WBC 6.0  HGB 12.7*  HCT 38.0*  PLT 300   CMET CMP     Component Value Date/Time   NA 136 08/10/2019 0230   K 4.4 08/10/2019 0230   CL 105 08/10/2019 0230   CO2 22 08/10/2019 0230   GLUCOSE 150 (H) 08/10/2019 0230   BUN 12 08/10/2019 0230   CREATININE 0.62 08/10/2019 0230   CALCIUM 8.8 (L) 08/10/2019 0230   PROT 7.7 08/10/2019 0230   ALBUMIN 3.2 (L) 08/10/2019 0230   AST 23 08/10/2019 0230   ALT  18 08/10/2019 0230   ALKPHOS 44 08/10/2019 0230   BILITOT 0.9 08/10/2019 0230   GFRNONAA >60 08/10/2019 0230   GFRAA >60 08/10/2019 0230    GFR Estimated Creatinine Clearance: 101.1 mL/min (by C-G  formula based on SCr of 0.62 mg/dL). No results for input(s): LIPASE, AMYLASE in the last 72 hours. No results for input(s): CKTOTAL, CKMB, CKMBINDEX, TROPONINI in the last 72 hours. Invalid input(s): POCBNP Recent Labs    08/11/19 0245 08/12/19 0242  DDIMER 1.84* 1.62*   No results for input(s): HGBA1C in the last 72 hours. Recent Labs    08/09/19 1216  TRIG 97   No results for input(s): TSH, T4TOTAL, T3FREE, THYROIDAB in the last 72 hours.  Invalid input(s): FREET3 Recent Labs    08/11/19 0245 08/12/19 0242  FERRITIN 367* 295   Coags: No results for input(s): INR in the last 72 hours.  Invalid input(s): PT Microbiology: Recent Results (from the past 240 hour(s))  SARS Coronavirus 2 [facility] order, Performed in [facility] hospital lab) Nasopharyngeal Nasopharyngeal Swab     Status: Abnormal   Collection Time: 08/09/19  8:38 AM   Specimen: Nasopharyngeal Swab  Result Value Ref Range Status   SARS Coronavirus 2 POSITIVE (A) NEGATIVE Final    Comment: RESULT CALLED TO, READ BACK BY AND VERIFIED WITH: K.SIMPSON,RN [id number] @1102  BY V.WILKINS (NOTE) If result is NEGATIVE SARS-CoV-2 target nucleic acids are NOT DETECTED. The SARS-CoV-2 RNA is generally detectable in upper and lower  respiratory specimens during the acute phase of infection. The lowest  concentration of SARS-CoV-2 viral copies this assay can detect is 250  copies / mL. A negative result does not preclude SARS-CoV-2 infection  and should not be used as the sole basis for treatment or other  patient management decisions.  A negative result may occur with  improper specimen collection / handling, submission of specimen other  than nasopharyngeal swab, presence of viral mutation(s) within the  areas targeted  by this assay, and inadequate number of viral copies  (<250 copies / mL). A negative result must be combined with clinical  observations, patient history, and epidemiological information. If result is POSITIVE SARS-CoV-2 target nucleic acids are DETECTED.  The SARS-CoV-2 RNA is generally detectable in upper and lower  respiratory specimens during the acute phase of infection.  Positive  results are indicative of active infection with SARS-CoV-2.  Clinical  correlation with patient history and other diagnostic information is  necessary to determine patient infection status.  Positive results do  not rule out bacterial infection or co-infection with other viruses. If result is PRESUMPTIVE POSTIVE SARS-CoV-2 nucleic acids MAY BE PRESENT.   A presumptive positive result was obtained on the submitted specimen  and confirmed on repeat testing.  While 2019 novel coronavirus  (SARS-CoV-2) nucleic acids may be present in the submitted sample  additional confirmatory testing may be necessary for epidemiological  and / or clinical management purposes  to differentiate between  SARS-CoV-2 and other Sarbecovirus currently known to infect humans.  If clinically indicated additional testing with an alternate test  methodology [phone])  is advised. The SARS-CoV-2 RNA is generally  detectable in upper and lower respiratory specimens during the acute  phase of infection. The expected result is Negative. Fact Sheet for Patients:  BoilerBrush.com.cy Fact Sheet for Healthcare Providers: https://pope.com/ This test is not yet approved or cleared by the [place] FDA and has been authorized for detection and/or diagnosis of SARS-CoV-2 by FDA under an Emergency Use Authorization (EUA).  This EUA will remain in effect (meaning this test can be used) for the duration of the COVID-19 declaration under Section 564(b)(1) of the Act, 21 U.S.C. section  360bbb-3(b)(1), unless the authorization is terminated or revoked  sooner. Performed at [facility], [address] [place], [place] [address]   Culture, blood (routine x 2)     Status: None (Preliminary result)   Collection Time: 08/09/19  8:38 AM   Specimen: BLOOD  Result Value Ref Range Status   Specimen Description   Final    BLOOD RIGHT WRIST Performed at [facility], [address] [place], [place] [address]    Special Requests   Final    BOTTLES DRAWN AEROBIC AND ANAEROBIC Blood Culture adequate volume Performed at [facility], [address], [place], [place] [address]    Culture   Final    NO GROWTH 2 DAYS Performed at [facility], [address], [place], [place] [address]    Report Status PENDING  Incomplete  Urine culture     Status: Abnormal   Collection Time: 08/09/19  8:38 AM   Specimen: Urine, Clean Catch  Result Value Ref Range Status   Specimen Description   Final    URINE, CLEAN CATCH Performed at [facility], [address], [place], [place] [address]    Special Requests   Final    Normal Performed at [facility], [address], [place], [place] [address]    Culture (A)  Final    <10,000 COLONIES/mL INSIGNIFICANT GROWTH Performed at [facility], [address] [place], [place] [address]    Report Status 08/11/2019 FINAL  Final  Culture, blood (routine x 2)     Status: None (Preliminary result)   Collection Time: 08/09/19  8:43 AM   Specimen: BLOOD  Result Value Ref Range Status   Specimen Description BLOOD LEFT ANTECUBITAL  Final   Special Requests   Final    BOTTLES DRAWN AEROBIC AND ANAEROBIC Blood Culture adequate volume   Culture   Final    NO GROWTH 2 DAYS Performed at [facility], [address], [place], [place] [address]    Report Status PENDING  Incomplete    FURTHER DISCHARGE INSTRUCTIONS:  Get Medicines  reviewed and adjusted: Please take all your medications with you for your next visit with your Primary MD  Laboratory/radiological data: Please request your Primary MD to go over all hospital tests and procedure/radiological results at the follow up, please ask your Primary MD to get all Hospital records sent to his/her office.  In some cases, they will be blood work, cultures and biopsy results pending at the time of your discharge. Please request that your primary care M.D. goes through all the records of your hospital data and follows up on these results.  Also Note the following: If you experience worsening of your admission symptoms, develop shortness of breath, life threatening emergency, suicidal or homicidal thoughts you must seek medical attention immediately by calling 911 or calling your MD immediately  if symptoms less severe.  You must read complete instructions/literature along with all the possible adverse reactions/side effects for all the Medicines you take and that have been prescribed to you. Take any new Medicines after you have completely understood and accpet all the possible adverse reactions/side effects.   Do not drive when taking Pain medications or sleeping medications (Benzodaizepines)  Do not take more than prescribed Pain, Sleep and Anxiety Medications. It is not advisable to combine anxiety,sleep and pain medications without talking with your primary care practitioner  Special Instructions: If you have smoked or chewed Tobacco  in the last 2  yrs please stop smoking, stop any regular Alcohol  and or any Recreational drug use.  Wear Seat belts while driving.  Please note: You were cared for by a hospitalist during your hospital stay. Once you are discharged, your primary care physician will handle any further medical issues. Please note that NO REFILLS for any discharge medications will be authorized once you are discharged, as it is imperative that you return to  your primary care physician (or establish a relationship with a primary care physician if you do not have one) for your post hospital discharge needs so that they can reassess your need for medications and monitor your lab values.  Total Time spent coordinating discharge including counseling, education and face to face time equals 25  minutes.  Signed:   08/12/2019 9:30 AM

## 2019-08-12 NOTE — Discharge Instructions (Signed)
Person Under Monitoring Name: Roger Howe  Location: [place] [address]   Infection Prevention Recommendations for Individuals Confirmed to have, or Being Evaluated for, 2019 Novel Coronavirus (COVID-19) Infection Who Receive Care at Home  Individuals who are confirmed to have, or are being evaluated for, COVID-19 should follow the prevention steps below until a healthcare provider or local or state health department says they can return to normal activities.  Stay home except to get medical care You should restrict activities outside your home, except for getting medical care. Do not go to work, school, or public areas, and do not use public transportation or taxis.  Call ahead before visiting your doctor Before your medical appointment, call the healthcare provider and tell them that you have, or are being evaluated for, COVID-19 infection. This will help the healthcare providers office take steps to keep other people from getting infected. Ask your healthcare provider to call the local or state health department.  Monitor your symptoms Seek prompt medical attention if your illness is worsening (e.g., difficulty breathing). Before going to your medical appointment, call the healthcare provider and tell them that you have, or are being evaluated for, COVID-19 infection. Ask your healthcare provider to call the local or state health department.  Wear a facemask You should wear a facemask that covers your nose and mouth when you are in the same room with other people and when you visit a healthcare provider. People who live with or visit you should also wear a facemask while they are in the same room with you.  Separate yourself from other people in your home As much as possible, you should stay in a different room from other people in your home. Also, you should use a separate bathroom, if available.  Avoid sharing household items You should not share dishes,  drinking glasses, cups, eating utensils, towels, bedding, or other items with other people in your home. After using these items, you should wash them thoroughly with soap and water.  Cover your coughs and sneezes Cover your mouth and nose with a tissue when you cough or sneeze, or you can cough or sneeze into your sleeve. Throw used tissues in a lined trash can, and immediately wash your hands with soap and water for at least 20 seconds or use an alcohol-based hand rub.  Wash your [facility] your hands often and thoroughly with soap and water for at least 20 seconds. You can use an alcohol-based hand sanitizer if soap and water are not available and if your hands are not visibly dirty. Avoid touching your eyes, nose, and mouth with unwashed hands.   Prevention Steps for Caregivers and Household Members of Individuals Confirmed to have, or Being Evaluated for, COVID-19 Infection Being Cared for in the Home  If you live with, or provide care at home for, a person confirmed to have, or being evaluated for, COVID-19 infection please follow these guidelines to prevent infection:  Follow healthcare providers instructions Make sure that you understand and can help the patient follow any healthcare provider instructions for all care.  Provide for the patients basic needs You should help the patient with basic needs in the home and provide support for getting groceries, prescriptions, and other personal needs.  Monitor the patients symptoms If they are getting sicker, call his or her medical provider and tell them that the patient has, or is being evaluated for, COVID-19 infection. This will help the healthcare providers office take  steps to keep other people from getting infected. Ask the healthcare provider to call the local or state health department.  Limit the number of people who have contact with the patient  If possible, have only one caregiver for the patient.  Other  household members should stay in another home or place of residence. If this is not possible, they should stay  in another room, or be separated from the patient as much as possible. Use a separate bathroom, if available.  Restrict visitors who do not have an essential need to be in the home.  Keep older adults, very young children, and other sick people away from the patient Keep older adults, very young children, and those who have compromised immune systems or chronic health conditions away from the patient. This includes people with chronic heart, lung, or kidney conditions, diabetes, and cancer.  Ensure good ventilation Make sure that shared spaces in the home have good air flow, such as from an air conditioner or an opened window, weather permitting.  Wash your hands often  Wash your hands often and thoroughly with soap and water for at least 20 seconds. You can use an alcohol based hand sanitizer if soap and water are not available and if your hands are not visibly dirty.  Avoid touching your eyes, nose, and mouth with unwashed hands.  Use disposable paper towels to dry your hands. If not available, use dedicated cloth towels and replace them when they become wet.  Wear a facemask and gloves  Wear a disposable facemask at all times in the room and gloves when you touch or have contact with the patients blood, body fluids, and/or secretions or excretions, such as sweat, saliva, sputum, nasal mucus, vomit, urine, or feces.  Ensure the mask fits over your nose and mouth tightly, and do not touch it during use.  Throw out disposable facemasks and gloves after using them. Do not reuse.  Wash your hands immediately after removing your facemask and gloves.  If your personal clothing becomes contaminated, carefully remove clothing and launder. Wash your hands after handling contaminated clothing.  Place all used disposable facemasks, gloves, and other waste in a lined container before  disposing them with other household waste.  Remove gloves and wash your hands immediately after handling these items.  Do not share dishes, glasses, or other household items with the patient  Avoid sharing household items. You should not share dishes, drinking glasses, cups, eating utensils, towels, bedding, or other items with a patient who is confirmed to have, or being evaluated for, COVID-19 infection.  After the person uses these items, you should wash them thoroughly with soap and water.  Wash laundry thoroughly  Immediately remove and wash clothes or bedding that have blood, body fluids, and/or secretions or excretions, such as sweat, saliva, sputum, nasal mucus, vomit, urine, or feces, on them.  Wear gloves when handling laundry from the patient.  Read and follow directions on labels of laundry or clothing items and detergent. In general, wash and dry with the warmest temperatures recommended on the label.  Clean all areas the individual has used often  Clean all touchable surfaces, such as counters, tabletops, doorknobs, bathroom fixtures, toilets, phones, keyboards, tablets, and bedside tables, every day. Also, clean any surfaces that may have blood, body fluids, and/or secretions or excretions on them.  Wear gloves when cleaning surfaces the patient has come in contact with.  Use a diluted bleach solution (e.g., dilute bleach with 1 part bleach  and 10 parts water) or a household disinfectant with a label that says EPA-registered for coronaviruses. To make a bleach solution at home, add 1 tablespoon of bleach to 1 quart (4 cups) of water. For a larger supply, add  cup of bleach to 1 gallon (16 cups) of water.  Read labels of cleaning products and follow recommendations provided on product labels. Labels contain instructions for safe and effective use of the cleaning product including precautions you should take when applying the product, such as wearing gloves or eye protection  and making sure you have good ventilation during use of the product.  Remove gloves and wash hands immediately after cleaning.  Monitor yourself for signs and symptoms of illness Caregivers and household members are considered close contacts, should monitor their health, and will be asked to limit movement outside of the home to the extent possible. Follow the monitoring steps for close contacts listed on the symptom monitoring form.   ? If you have additional questions, contact your local health department or call the epidemiologist on call at [phone] (available 24/7). ? This guidance is subject to change. For the most up-to-date guidance from [facility], please refer to their website: [email or web address]

## 2019-08-13 ENCOUNTER — Encounter: Payer: Self-pay | Admitting: Cardiology

## 2019-08-14 LAB — CULTURE, BLOOD (ROUTINE X 2)
Culture: NO GROWTH
Culture: NO GROWTH
Special Requests: ADEQUATE
Special Requests: ADEQUATE

## 2019-08-19 ENCOUNTER — Inpatient Hospital Stay (INDEPENDENT_AMBULATORY_CARE_PROVIDER_SITE_OTHER): Payer: Medicare Other | Admitting: Primary Care

## 2019-09-11 ENCOUNTER — Other Ambulatory Visit: Payer: Self-pay

## 2019-09-11 ENCOUNTER — Ambulatory Visit (INDEPENDENT_AMBULATORY_CARE_PROVIDER_SITE_OTHER): Payer: Medicare Other | Admitting: Primary Care

## 2019-09-11 DIAGNOSIS — Z09 Encounter for follow-up examination after completed treatment for conditions other than malignant neoplasm: Secondary | ICD-10-CM

## 2019-09-11 DIAGNOSIS — U071 COVID-19: Secondary | ICD-10-CM | POA: Diagnosis not present

## 2019-09-11 NOTE — Progress Notes (Signed)
Patient was covid positive one month ago.  No more symptoms

## 2019-09-11 NOTE — Progress Notes (Signed)
Virtual Visit via Telephone Note  I connected with Roger Howe on 09/11/19 at 10:10 AM EDT by telephone and verified that I am speaking with the correct person using two identifiers.   I discussed the limitations, risks, security and privacy concerns of performing an evaluation and management service by telephone and the availability of in person appointments. I also discussed with the patient that there may be a patient responsible charge related to this service. The patient expressed understanding and agreed to proceed.   History of Present Illness:  Roger Howe is hospital follow up for Roger Howe he has a PCP [facility] on [address].Patient does not speak english, nephew (Roger Howe )is his interrupter Patient speaks Roger Howe ,  Past Medical History:  Diagnosis Date  . Abdominal aortic atherosclerosis (HCC)    Seen on catheterization  . 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 Coronary artery disease   . Essential hypertension   . NSTEMI (non-ST elevated myocardial infarction) ([facility]) 05/17/2014   Post CABG Echo 09/19/2014: Normal EF 55-60% with mild Conc LVH. No regional WMA, essentially normal  . 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  Assessment and Plan: Roger Howe was seen today for new patient (initial visit).  Diagnoses and all orders for this visit:  [id number] virus detected Hospitalized 08/09/2019 chest x-ray revealed patchy opacity throughout both lower lungs probable pneumonia and hypokalemia and replace K+  Hospital discharge follow-up Roger Howe is doing well denies any signs of symptoms. He plans to continuing follow up with [facility]  Follow Up Instructions:    I discussed the assessment and treatment plan with the patient. The patient was provided an opportunity to  ask questions and all were answered. The patient agreed with the plan and demonstrated an understanding of the instructions.   The patient was advised to call back or seek an in-person evaluation if the symptoms worsen or if the condition fails to improve as anticipated.  I provided 20 minutes of non-face-to-face time during this encounter.   Kerin Perna, NP

## 2020-03-26 ENCOUNTER — Ambulatory Visit: Payer: Medicare Other | Attending: Internal Medicine

## 2020-03-26 DIAGNOSIS — Z23 Encounter for immunization: Secondary | ICD-10-CM

## 2020-03-26 NOTE — Progress Notes (Signed)
   Covid-19 Vaccination Clinic  Name:  Roger Howe    MRN: [id number] DOB: August 20, 1956  03/26/2020  [name] was observed post Covid-19 immunization for 15 minutes without incident. He was provided with Vaccine Information Sheet and instruction to access the V-Safe system.   [name] was instructed to call 911 with any severe reactions post vaccine: Marland Kitchen Difficulty breathing  . Swelling of face and throat  . A fast heartbeat  . A bad rash all over body  . Dizziness and weakness

## 2021-08-07 NOTE — Progress Notes (Signed)
[facility] - Clinic Note  08/09/2021     CHIEF COMPLAINT Patient presents for Retina Evaluation   HISTORY OF PRESENT ILLNESS: Roger Howe is a 65 y.o. male who presents to the clinic today for:   HPI     Retina Evaluation   In both eyes.  Context:  distance vision and near vision.  I, the attending physician,  performed the HPI with the patient and updated documentation appropriately.        Comments   Retina eval per Dr. [name] for BRVO OS- Patient had cataract sx OU.  OD is doing well.  Vision never improved OS.  +CME OS, + K-sicca OU Hx MI with CABG 7 years ago.  He is taking 2 different drops currently.  He did not bring the drops but knows both are twice a day.  Patient does not take any medications besides Ibuprofen and an occasional Aspirin.  He drinks tea to help with BP.       Last edited by [name], MD on 08/09/2021  8:55 AM.      Referring physician: [name], MD [address] [place],  [place] [address]  HISTORICAL INFORMATION:   Selected notes from the MEDICAL RECORD NUMBER Referred by Dr. [name] for eval of BRVO OS LEE:  Ocular Hx- PMH-    CURRENT MEDICATIONS: No current outpatient medications on file. (Ophthalmic Drugs)   No current facility-administered medications for this visit. (Ophthalmic Drugs)   Current Outpatient Medications (Other)  Medication Sig   aspirin 81 MG chewable tablet Chew 81 mg by mouth daily.   atorvastatin (LIPITOR) 20 MG tablet Take 20 mg by mouth daily at 6 PM. (Patient not taking: Reported on 08/09/2021)   carvedilol (COREG) 3.125 MG tablet TAKE 1 TABLET BY MOUTH TWICE A DAY *PLS CALL OFFICE FOR ADDITIONAL REFILLS* (Patient not taking: Reported on 08/09/2021)   fenofibrate micronized (LOFIBRA) 134 MG capsule Take 134 mg by mouth daily. (Patient not taking: Reported on 08/09/2021)   lisinopril-hydrochlorothiazide (ZESTORETIC) 10-12.5 MG tablet Take 1 tablet by mouth daily. (Patient not  taking: Reported on 08/09/2021)   metoprolol succinate (TOPROL-XL) 25 MG 24 hr tablet Take 25 mg by mouth daily. (Patient not taking: Reported on 08/09/2021)   No current facility-administered medications for this visit. (Other)      REVIEW OF SYSTEMS: ROS   Positive for: Cardiovascular, Eyes Negative for: Constitutional, Gastrointestinal, Neurological, Skin, Genitourinary, Musculoskeletal, HENT, Endocrine, Respiratory, Psychiatric, Allergic/Imm, Heme/Lymph Last edited by Joni Reining, COA on 08/09/2021  8:26 AM.       ALLERGIES No Known Allergies  PAST MEDICAL HISTORY Past Medical History:  Diagnosis Date   Abdominal aortic atherosclerosis (HCC)    Seen on catheterization   CAD, multiple vessel 05/18/2014   Coronary artery disease    Essential hypertension    NSTEMI (non-ST elevated myocardial infarction) (HCC) 05/17/2014   Post CABG Echo 09/19/2014: Normal EF 55-60% with mild Conc LVH. 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   LIMA to LAD, SVG to D1, Sequential SVG to PDA and RPL2, EVH via right thigh and leg   Past Surgical History:  Procedure Laterality Date   CARDIAC CATHETERIZATION  05/17/2014   DR HARDING: 95% proximal LAD, 90% ramus intermedius, 95% mid circumflex after OM1, neither percent RPDA   CORONARY ARTERY BYPASS GRAFT N/A 05/20/2014   Procedure: CORONARY ARTERY BYPASS GRAFTING (CABG) x 4,  LIMA-MID LAD,  SVG-DIAGONAL,  SEQUENTIAL SVG -PDA, PLB #2;  Surgeon: [name], MD;  Location: [facility] OR;  Service: Open Heart Surgery;  Laterality: N/A;   INTRAOPERATIVE TRANSESOPHAGEAL ECHOCARDIOGRAM N/A 05/20/2014   Procedure: INTRAOPERATIVE TRANSESOPHAGEAL ECHOCARDIOGRAM;  Surgeon: [name], MD;  Location[facility] OR;  Service: Open Heart Surgery;  Laterality: N/A;   LEFT HEART CATHETERIZATION WITH CORONARY ANGIOGRAM N/A 05/18/2014   Procedure: LEFT HEART CATHETERIZATION WITH CORONARY ANGIOGRAM;  Surgeon: [name], MD;  Location: [facility] CATH LAB;   Service: Cardiovascular;  Laterality: N/A;   TRANSTHORACIC ECHOCARDIOGRAM  09/14/2014   Normal EF 55-60% with mild Conc LVH. No regional WMA, essentially normal    FAMILY HISTORY Family History  Problem Relation Age of Onset   Heart disease Brother        lives in [place]   Heart attack Neg Hx    Stroke Neg Hx     SOCIAL HISTORY Social History   Tobacco Use   Smoking status: Never   Smokeless tobacco: Never  Substance Use Topics   Alcohol use: Never   Drug use: Never         OPHTHALMIC EXAM:  Base Eye Exam     Visual Acuity (Snellen - Linear)       Right Left   Dist [place] 20/40 20/50 +1   Dist ph [place] 20/40 +2 NI         Tonometry (Tonopen, 8:44 AM)       Right Left   Pressure 11 10         Pupils       Dark Light Shape React APD   Right 4 3 Round Brisk None   Left 4 3 Round Brisk None         Visual Fields (Counting fingers)       Left Right    Full Full         Extraocular Movement       Right Left    Full Full         Neuro/Psych     Oriented x3: Yes   Mood/Affect: Normal         Dilation     Both eyes: 1.0% Mydriacyl, 2.5% Phenylephrine @ 8:44 AM           Slit Lamp and Fundus Exam     Slit Lamp Exam       Right Left   Lids/Lashes Dermatochalasis - upper lid Dermatochalasis - upper lid   Conjunctiva/Sclera White and quiet Mild melanosis   Cornea Arcus, , Well healed cataract wound Arcus, Well healed cataract wound   Anterior Chamber Deep and quiet Deep and quiet   Iris Round and dilated Round and dilated   Lens PCIOL in good position PCIOL in good position   Vitreous Vitreous syneresis Vitreous syneresis         Fundus Exam       Right Left   Disc Pink and sharp Pink and sharp   C/D Ratio 0.3 0.4   Macula Flat, Good foveal reflex, RPE mottling, no heme or edema IRH/CWS superior macula   Vessels Vascular attenuation, Tortuous, Copper wiring, AV crossing changes Vascular attenuation, Tortuous, flame hemes along  ST arcades--BRVO   Periphery Attached, no heme Attached, IRH superior midzone (extension of ST BRVO)           Refraction     Manifest Refraction       Sphere Cylinder Axis Dist VA   Right -1.00 +1.25  085 20/30+   Left -0.50 +1.25 130 20/40            IMAGING AND PROCEDURES  Imaging and Procedures for 08/09/2021  OCT, Retina - OU - Both Eyes       Right Eye Quality was good. Central Foveal Thickness: 270. Progression has no prior data. Findings include normal foveal contour, no IRF, no SRF.   Left Eye Quality was good. Central Foveal Thickness: 364. Progression has no prior data. Findings include abnormal foveal contour, intraretinal fluid, no SRF, intraretinal hyper-reflective material (Superior and temporal IRF/IRHM--BRVO).   Notes *Images captured and stored on drive  Diagnosis / Impression:  OD: NFP, no IRF/SRF OS: Superior and temporal IRF/IRHM--ST BRVO  Clinical management:  See below  Abbreviations: NFP - Normal foveal profile. CME - cystoid macular edema. PED - pigment epithelial detachment. IRF - intraretinal fluid. SRF - subretinal fluid. EZ - ellipsoid zone. ERM - epiretinal membrane. ORA - outer retinal atrophy. ORT - outer retinal tubulation. SRHM - subretinal hyper-reflective material. IRHM - intraretinal hyper-reflective material              ASSESSMENT/PLAN:    ICD-10-CM   1. Branch retinal vein occlusion of left eye with macular edema  H34.8320     2. Retinal edema  H35.81 OCT, Retina - OU - Both Eyes    3. Essential hypertension  I10     4. Hypertensive retinopathy of both eyes  H35.033     5. Pseudophakia of both eyes  Z96.1       1,2. BRVO w/ CME OS - The natural history of retinal vein occlusion and macular edema and treatment options including observation, laser photocoagulation, and intravitreal antiVEGF injection with Avastin and Lucentis and Eylea and intravitreal injection of steroids with triamcinolone and Ozurdex and the  complications of these procedures including loss of vision, infection, cataract, glaucoma, and retinal detachment were discussed with patient. - Specifically discussed findings from CRUISE / BRAVO study regarding patient stabilization with anti-VEGF agents and increased potential for visual improvements.  Also discussed need for frequent follow up and potentially multiple injections given the chronic nature of the disease process - BCVA OS 20/50 - OCT shows Superior and temporal IRF/IRHM consistent with ST BRVO - recommend IVA OD #1 today, OS - Unable to proceed today due to elevated BP in office of R arm (199/122) and L arm (186/110) - Will urgently refer to PCP or [facility] for evaluation and management of hypertension. - f/u in 2-3 weeks, DFE/OCT, possible injection  3,4. Hypertensive retinopathy OU - BP in office of R arm (199/122) and L arm (186/110) - patient denies headache, chest pain, peripheral numbness/tingling - states he has not taken his BP medicines since Feb and has not seen his PCP who prescribed them in over a year - discussed importance of tight BP control - advised urgent follow up with PCP for management of HTN - appointment made with [facility] for today at 2 pm -- pt verbalized understanding and will go to appt  5. Pseudophakia OU  - s/p CE/IOL OU (S. Groat)  - IOLs in good position, doing well  - post op drops per Dr. [name]  - monitor   Ophthalmic Meds Ordered this visit:  No orders of the defined types were placed in this encounter.      Return for 2-3 wks or sooner -- BRVO OS, Dilated Exam, OCT.  There are no Patient Instructions on file for this  visit.   Explained the diagnoses, plan, and follow up with the patient and they expressed understanding.  Patient expressed understanding of the importance of proper follow up care.   This document serves as a record of services personally performed by Karie Chimera, MD, PhD. It was  created on their behalf by Annalee Genta, COMT. The creation of this record is the provider's dictation and/or activities during the visit.  Electronically signed by: Annalee Genta, COMT 08/09/21 11:59 AM  This document serves as a record of services personally performed by Karie Chimera, MD, PhD. It was created on their behalf by Herby Abraham, COA, an ophthalmic technician. The creation of this record is the provider's dictation and/or activities during the visit.    Electronically signed by: Herby Abraham, COA @TODAY @ 11:59 AM   Karie Chimera, M.D., Ph.D. Diseases & Surgery of the Retina and Vitreous [facility]  I have reviewed the above documentation for accuracy and completeness, and I agree with the above. Karie Chimera, M.D., Ph.D. 08/09/21 12:08 PM   Abbreviations: M myopia (nearsighted); A astigmatism; H hyperopia (farsighted); P presbyopia; Mrx spectacle prescription;  CTL contact lenses; OD right eye; OS left eye; OU both eyes  XT exotropia; ET esotropia; PEK punctate epithelial keratitis; PEE punctate epithelial erosions; DES dry eye syndrome; MGD meibomian gland dysfunction; ATs artificial tears; PFAT's preservative free artificial tears; NSC nuclear sclerotic cataract; PSC posterior subcapsular cataract; ERM epi-retinal membrane; PVD posterior vitreous detachment; RD retinal detachment; DM diabetes mellitus; DR diabetic retinopathy; NPDR non-proliferative diabetic retinopathy; PDR proliferative diabetic retinopathy; CSME clinically significant macular edema; DME diabetic macular edema; dbh dot blot hemorrhages; CWS cotton wool spot; POAG primary open angle glaucoma; C/D cup-to-disc ratio; HVF humphrey visual field; GVF goldmann visual field; OCT optical coherence tomography; IOP intraocular pressure; BRVO Branch retinal vein occlusion; CRVO central retinal vein occlusion; CRAO central retinal artery occlusion; BRAO branch retinal artery occlusion; RT  retinal tear; SB scleral buckle; PPV pars plana vitrectomy; VH Vitreous hemorrhage; PRP panretinal laser photocoagulation; IVK intravitreal kenalog; VMT vitreomacular traction; MH Macular hole;  NVD neovascularization of the disc; NVE neovascularization elsewhere; AREDS age related eye disease study; ARMD age related macular degeneration; POAG primary open angle glaucoma; EBMD epithelial/anterior basement membrane dystrophy; ACIOL anterior chamber intraocular lens; IOL intraocular lens; PCIOL posterior chamber intraocular lens; Phaco/IOL phacoemulsification with intraocular lens placement; PRK photorefractive keratectomy; LASIK laser assisted in situ keratomileusis; HTN hypertension; DM diabetes mellitus; COPD chronic obstructive pulmonary disease

## 2021-08-09 ENCOUNTER — Other Ambulatory Visit: Payer: Self-pay

## 2021-08-09 ENCOUNTER — Ambulatory Visit (INDEPENDENT_AMBULATORY_CARE_PROVIDER_SITE_OTHER): Payer: Medicare Other | Admitting: Ophthalmology

## 2021-08-09 ENCOUNTER — Encounter (INDEPENDENT_AMBULATORY_CARE_PROVIDER_SITE_OTHER): Payer: Self-pay | Admitting: Ophthalmology

## 2021-08-09 VITALS — BP 186/110 | HR 88

## 2021-08-09 DIAGNOSIS — I10 Essential (primary) hypertension: Secondary | ICD-10-CM

## 2021-08-09 DIAGNOSIS — H35033 Hypertensive retinopathy, bilateral: Secondary | ICD-10-CM | POA: Diagnosis not present

## 2021-08-09 DIAGNOSIS — H34832 Tributary (branch) retinal vein occlusion, left eye, with macular edema: Secondary | ICD-10-CM

## 2021-08-09 DIAGNOSIS — H3581 Retinal edema: Secondary | ICD-10-CM | POA: Diagnosis not present

## 2021-08-09 DIAGNOSIS — Z961 Presence of intraocular lens: Secondary | ICD-10-CM

## 2021-08-29 ENCOUNTER — Encounter (INDEPENDENT_AMBULATORY_CARE_PROVIDER_SITE_OTHER): Payer: Medicare Other | Admitting: Ophthalmology

## 2021-08-29 DIAGNOSIS — Z961 Presence of intraocular lens: Secondary | ICD-10-CM

## 2021-08-29 DIAGNOSIS — H35033 Hypertensive retinopathy, bilateral: Secondary | ICD-10-CM

## 2021-08-29 DIAGNOSIS — H3581 Retinal edema: Secondary | ICD-10-CM

## 2021-08-29 DIAGNOSIS — I10 Essential (primary) hypertension: Secondary | ICD-10-CM

## 2021-08-29 DIAGNOSIS — H34832 Tributary (branch) retinal vein occlusion, left eye, with macular edema: Secondary | ICD-10-CM

## 2021-09-19 NOTE — Progress Notes (Signed)
[facility] Note  09/20/2021     CHIEF COMPLAINT Patient presents for Retina Follow Up   HISTORY OF PRESENT ILLNESS: Roger Howe is a 65 y.o. male who presents to the clinic today for:   HPI     Retina Follow Up   Patient presents with  CRVO/BRVO.  In left eye.  Severity is mild.  Duration of 5 weeks.  Since onset it is gradually improving.  I, the attending physician,  performed the HPI with the patient and updated documentation appropriately.        Comments   Pt here for 5 wk ret f/u for BRVO OS. Pt states his vision has been improving. No issues reported.       Last edited by Bernarda Caffey, MD on 09/24/2021  1:35 AM.    VA still a bit blurred OS  Referring physician: Sandi Mariscal, MD [place],  [place] [address]  HISTORICAL INFORMATION:   Selected notes from the MEDICAL RECORD NUMBER Referred by Dr. Katy Fitch for eval of BRVO OS   CURRENT MEDICATIONS: No current outpatient medications on file. (Ophthalmic Drugs)   No current facility-administered medications for this visit. (Ophthalmic Drugs)   Current Outpatient Medications (Other)  Medication Sig   aspirin 81 MG chewable tablet Chew 81 mg by mouth daily.   atorvastatin (LIPITOR) 20 MG tablet Take 20 mg by mouth daily at 6 PM. (Patient not taking: Reported on 08/09/2021)   carvedilol (COREG) 3.125 MG tablet TAKE 1 TABLET BY MOUTH TWICE A DAY *PLS CALL OFFICE FOR ADDITIONAL REFILLS* (Patient not taking: Reported on 08/09/2021)   fenofibrate micronized (LOFIBRA) 134 MG capsule Take 134 mg by mouth daily. (Patient not taking: Reported on 08/09/2021)   lisinopril-hydrochlorothiazide (ZESTORETIC) 10-12.5 MG tablet Take 1 tablet by mouth daily. (Patient not taking: Reported on 08/09/2021)   metoprolol succinate (TOPROL-XL) 25 MG 24 hr tablet Take 25 mg by mouth daily. (Patient not taking: Reported on 08/09/2021)   No current facility-administered medications for this visit. (Other)    REVIEW OF SYSTEMS: ROS   Positive for: Cardiovascular, Eyes Negative for: Constitutional, Gastrointestinal, Neurological, Skin, Genitourinary, Musculoskeletal, HENT, Endocrine, Respiratory, Psychiatric, Allergic/Imm, Heme/Lymph Last edited by Kingsley Spittle, COT on 09/20/2021  9:40 AM.      ALLERGIES No Known Allergies  PAST MEDICAL HISTORY Past Medical History:  Diagnosis Date   Abdominal aortic atherosclerosis ([place])    Seen on catheterization   CAD, multiple vessel 05/18/2014   Coronary artery disease    Essential hypertension    NSTEMI (non-ST elevated myocardial infarction) ([place]) 05/17/2014   Post CABG Echo 09/19/2014: Normal EF 55-60% with mild Conc LVH. 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   LIMA to LAD, SVG to D1, Sequential SVG to PDA and RPL2, EVH via right thigh and leg   Past Surgical History:  Procedure Laterality Date   CARDIAC CATHETERIZATION  05/17/2014   DR HARDING: 95% proximal LAD, 90% ramus intermedius, 95% mid circumflex after OM1, neither percent RPDA   CORONARY ARTERY BYPASS GRAFT N/A 05/20/2014   Procedure: CORONARY ARTERY BYPASS GRAFTING (CABG) x 4,  LIMA-MID LAD,  SVG-DIAGONAL,  SEQUENTIAL SVG -PDA, PLB #2;  Surgeon: Rexene Alberts, MD;  Location: [facility];  Service: Open Heart Surgery;  Laterality: N/A;   INTRAOPERATIVE TRANSESOPHAGEAL ECHOCARDIOGRAM N/A 05/20/2014   Procedure: INTRAOPERATIVE TRANSESOPHAGEAL ECHOCARDIOGRAM;  Surgeon: Rexene Alberts, MD;  Location: [facility];  Service: Open Heart Surgery;  Laterality: N/A;   LEFT HEART CATHETERIZATION WITH CORONARY ANGIOGRAM N/A 05/18/2014   Procedure: LEFT HEART CATHETERIZATION WITH CORONARY ANGIOGRAM;  Surgeon: Leonie Man, MD;  Location: [facility] C[facility] LAB;  Service: Cardiovascular;  Laterality: N/A;   TRANSTHORACIC ECHOCARDIOGRAM  09/14/2014   Normal EF 55-60% with mild Conc LVH. No regional WMA, essentially normal    FAMILY HISTORY Family History  Problem Relation Age of Onset    Heart disease Brother        lives in [place]   Heart attack Neg Hx    Stroke Neg Hx     SOCIAL HISTORY Social History   Tobacco Use   Smoking status: Never   Smokeless tobacco: Never  Substance Use Topics   Alcohol use: Never   Drug use: Never       OPHTHALMIC EXAM:  Base Eye Exam     Visual Acuity (Snellen - Linear)       Right Left   Dist [place] 20/25 +2 20/40   Dist ph   20/25         Tonometry (Tonopen, 9:48 AM)       Right Left   Pressure 12 12         Pupils       Dark Light Shape React APD   Right 4 3 Round Brisk None   Left 4 3 Round Brisk None         Visual Fields (Counting fingers)       Left Right    Full Full         Extraocular Movement       Right Left    Full, Ortho Full, Ortho         Neuro/Psych     Oriented x3: Yes   Mood/Affect: Normal         Dilation     Both eyes: 1.0% Mydriacyl, 2.5% Phenylephrine @ 9:49 AM           Slit Lamp and Fundus Exam     Slit Lamp Exam       Right Left   Lids/Lashes Dermatochalasis - upper lid Dermatochalasis - upper lid   Conjunctiva/Sclera White and quiet Mild melanosis   Cornea Arcus, Well healed cataract wound Arcus, Well healed cataract wound   Anterior Chamber Deep and quiet Deep and quiet   Iris Round and dilated Round and dilated   Lens PCIOL in good position PCIOL in good position   Vitreous Vitreous syneresis, condensations Vitreous syneresis         Fundus Exam       Right Left   Disc Pink and sharp, mild PPA Pink and sharp   C/D Ratio 0.3 0.4   Macula Flat, Good foveal reflex, RPE mottling, no heme or edema IRH/CWS superior macula--improved, residual flame hemes ST macula   Vessels Vascular attenuation, Tortuous, Copper wiring & AV crossing changes slightly improved Vascular attenuation, Tortuous, flame hemes along ST arcades--BRVO/BRAO   Periphery Attached, no heme Attached, IRH superior midzone (extension of ST BRVO)            IMAGING AND  PROCEDURES  Imaging and Procedures for 09/20/2021  OCT, Retina - OU - Both Eyes       Right Eye Quality was good. Central Foveal Thickness: 263. Progression has been stable. Findings include normal foveal contour, no IRF, no SRF, retinal drusen .   Left Eye Quality was good. Central Foveal Thickness: 300. Progression has improved. Findings include abnormal  foveal contour, intraretinal fluid, no SRF, intraretinal hyper-reflective material, inner retinal atrophy (Interval improvement in superior and temporal IRF/IRHM--BRVO w/BRAO compenent.).   Notes *Images captured and stored on drive  Diagnosis / Impression:  OD: NFP, no IRF/SRF OS: Interval improvement in superior and temporal IRF/IRHM--BRVO w/BRAO compenent.  Clinical management:  See below  Abbreviations: NFP - Normal foveal profile. CME - cystoid macular edema. PED - pigment epithelial detachment. IRF - intraretinal fluid. SRF - subretinal fluid. EZ - ellipsoid zone. ERM - epiretinal membrane. ORA - outer retinal atrophy. ORT - outer retinal tubulation. SRHM - subretinal hyper-reflective material. IRHM - intraretinal hyper-reflective material      Intravitreal Injection, Pharmacologic Agent - OS - Left Eye       Time Out 09/20/2021. 10:37 AM. Confirmed correct patient, procedure, site, and patient consented.   Anesthesia Topical anesthesia was used. Anesthetic medications included Lidocaine 2%, Proparacaine 0.5%.   Procedure Preparation included 5% betadine to ocular surface, eyelid speculum. A (32g) needle was used.   Injection: 1.25 mg Bevacizumab 1.32m/0.05ml   Route: Intravitreal, Site: Left Eye   NDC:: 56213-086-57 Lot:: [id number] Expiration date: 11/22/2021, Waste: 0.05 mL   Post-op Post injection exam found visual acuity of at least counting fingers. The patient tolerated the procedure well. There were no complications. The patient received written and verbal post procedure care education. Post injection  medications were not given.            ASSESSMENT/PLAN:    ICD-10-CM   1. Branch retinal vein occlusion of left eye with macular edema  H34.8320 Intravitreal Injection, Pharmacologic Agent - OS - Left Eye    Bevacizumab (AVASTIN) SOLN 1.25 mg    2. Retinal edema  H35.81 OCT, Retina - OU - Both Eyes    3. Essential hypertension  I10     4. Hypertensive retinopathy of both eyes  H35.033     5. Pseudophakia of both eyes  Z96.1     1,2. BRVO w/ CME OS - The natural history of retinal vein occlusion and macular edema and treatment options including observation, laser photocoagulation, and intravitreal antiVEGF injection with Avastin and Lucentis and Eylea and intravitreal injection of steroids with triamcinolone and Ozurdex and the complications of these procedures including loss of vision, infection, cataract, glaucoma, and retinal detachment were discussed with patient. - Specifically discussed findings from CMelrose/ BLyndonstudy regarding patient stabilization with anti-VEGF agents and increased potential for visual improvements.  Also discussed need for frequent follow up and potentially multiple injections given the chronic nature of the disease process - pt was first seen on 8.10.22, but could not be treated with intravitreal injection because BP was extremely high R arm (199/122) and L arm (186/110) - pt was referred back to PCP, who has since restarted BP meds - BCVA OS 20/25 improved - OCT shows interval improvement in superior and temporal IRF/IRHM--BRVO w/ BRAO compenent (inner retinal atrophy and hyperreflectivity. - Pt now on meds for BP - recommend IVA OS #1 today, 09.21.22 - Pt in agreement - Risks and benefits of tx discussed w/pt. - f/u in 4 weeks, DFE/OCT, possible injection  3,4. Hypertensive retinopathy OU  - BP at presentation (8.10.22) were extremely high -- R arm (199/122) and L arm (186/110)  - pt was referred back to PCP, who has since restarted BP meds -  discussed importance of tight BP control  5. Pseudophakia OU  - s/p CE/IOL OU ([place])  - IOLs in good position, doing well  -  post op drops per Dr. Katy Fitch  - monitor  Ophthalmic Meds Ordered this visit:  Meds ordered this encounter  Medications   Bevacizumab (AVASTIN) SOLN 1.25 mg       Return in about 4 weeks (around 10/18/2021) for 4 wk f/u for BRVO/BRAO OS w/DFE/OCT/poss inj. OS.  There are no Patient Instructions on file for this visit.  Explained the diagnoses, plan, and follow up with the patient and they expressed understanding.  Patient expressed understanding of the importance of proper follow up care.   This document serves as a record of services personally performed by Gardiner Sleeper, MD, PhD. It was created on their behalf by Roselee Nova, COMT. The creation of this record is the provider's dictation and/or activities during the visit.  Electronically signed by: Roselee Nova, COMT 09/24/21 1:37 AM  Gardiner Sleeper, M.D., Ph.D. Diseases & Surgery of the Retina and [place] 9.21.22  I have reviewed the above documentation for accuracy and completeness, and I agree with the above. Gardiner Sleeper, M.D., Ph.D. 09/24/21 1:41 AM   Abbreviations: M myopia (nearsighted); A astigmatism; H hyperopia (farsighted); P presbyopia; Mrx spectacle prescription;  CTL contact lenses; OD right eye; OS left eye; OU both eyes  XT exotropia; ET esotropia; PEK punctate epithelial keratitis; PEE punctate epithelial erosions; DES dry eye syndrome; MGD meibomian gland dysfunction; ATs artificial tears; PFAT's preservative free artificial tears; [place] nuclear sclerotic cataract; PSC posterior subcapsular cataract; ERM epi-retinal membrane; PVD posterior vitreous detachment; RD retinal detachment; DM diabetes mellitus; DR diabetic retinopathy; NPDR non-proliferative diabetic retinopathy; PDR proliferative diabetic retinopathy; CSME clinically significant macular  edema; DME diabetic macular edema; dbh dot blot hemorrhages; CWS cotton wool spot; POAG primary open angle glaucoma; C/D cup-to-disc ratio; HVF humphrey visual field; GVF goldmann visual field; OCT optical coherence tomography; IOP intraocular pressure; BRVO Branch retinal vein occlusion; CRVO central retinal vein occlusion; CRAO central retinal artery occlusion; BRAO branch retinal artery occlusion; RT retinal tear; SB scleral buckle; PPV pars plana vitrectomy; VH Vitreous hemorrhage; PRP panretinal laser photocoagulation; IVK intravitreal kenalog; VMT vitreomacular traction; MH Macular hole;  NVD neovascularization of the disc; NVE neovascularization elsewhere; AREDS age related eye disease study; ARMD age related macular degeneration; POAG primary open angle glaucoma; EBMD epithelial/anterior basement membrane dystrophy; ACIOL anterior chamber intraocular lens; IOL intraocular lens; PCIOL posterior chamber intraocular lens; Phaco/IOL phacoemulsification with intraocular lens placement; [place] photorefractive keratectomy; LASIK laser assisted in situ keratomileusis; HTN hypertension; DM diabetes mellitus; COPD chronic obstructive pulmonary disease

## 2021-09-20 ENCOUNTER — Ambulatory Visit (INDEPENDENT_AMBULATORY_CARE_PROVIDER_SITE_OTHER): Payer: Medicare Other | Admitting: Ophthalmology

## 2021-09-20 ENCOUNTER — Other Ambulatory Visit: Payer: Self-pay

## 2021-09-20 ENCOUNTER — Encounter (INDEPENDENT_AMBULATORY_CARE_PROVIDER_SITE_OTHER): Payer: Self-pay | Admitting: Ophthalmology

## 2021-09-20 VITALS — BP 148/83 | HR 81

## 2021-09-20 DIAGNOSIS — H35033 Hypertensive retinopathy, bilateral: Secondary | ICD-10-CM

## 2021-09-20 DIAGNOSIS — I10 Essential (primary) hypertension: Secondary | ICD-10-CM

## 2021-09-20 DIAGNOSIS — H34832 Tributary (branch) retinal vein occlusion, left eye, with macular edema: Secondary | ICD-10-CM

## 2021-09-20 DIAGNOSIS — Z961 Presence of intraocular lens: Secondary | ICD-10-CM | POA: Diagnosis not present

## 2021-09-20 DIAGNOSIS — H3581 Retinal edema: Secondary | ICD-10-CM

## 2021-09-24 ENCOUNTER — Encounter (INDEPENDENT_AMBULATORY_CARE_PROVIDER_SITE_OTHER): Payer: Self-pay | Admitting: Ophthalmology

## 2021-09-24 MED ORDER — BEVACIZUMAB CHEMO INJECTION 1.25MG/0.05ML SYRINGE FOR KALEIDOSCOPE
1.2500 mg | INTRAVITREAL | Status: AC | PRN
  Administered 2021-09-20: 1.25 mg via INTRAVITREAL

## 2021-10-12 NOTE — Progress Notes (Signed)
[facility] - Clinic Note  10/18/2021     CHIEF COMPLAINT Patient presents for Retina Follow Up   HISTORY OF PRESENT ILLNESS: Roger Howe is a 65 y.o. male who presents to the clinic today for:   HPI     Retina Follow Up   Patient presents with  CRVO/BRVO.  In left eye.  This started weeks ago.  Severity is moderate.  Duration of weeks.  Since onset it is gradually improving.  I, the attending physician,  performed the HPI with the patient and updated documentation appropriately.        Comments   Pt states vision is getting better OU.  Pt denies eye pain or discomfort.  Pt denies new or worsening floaters or fol OU.      Last edited by [name], MD on 10/18/2021  9:08 AM.    Pt reports still taking BP meds and has f/u with PCP in Dec   Referring physician: Salli Real, MD [address] [place],  [place] [address]  HISTORICAL INFORMATION:  Selected notes from the MEDICAL RECORD NUMBER Referred by Dr. [name] for eval of BRVO OS   CURRENT MEDICATIONS: No current outpatient medications on file. (Ophthalmic Drugs)   No current facility-administered medications for this visit. (Ophthalmic Drugs)   Current Outpatient Medications (Other)  Medication Sig   aspirin 81 MG chewable tablet Chew 81 mg by mouth daily.   atorvastatin (LIPITOR) 20 MG tablet Take 20 mg by mouth daily at 6 PM. (Patient not taking: Reported on 08/09/2021)   carvedilol (COREG) 3.125 MG tablet TAKE 1 TABLET BY MOUTH TWICE A DAY *PLS CALL OFFICE FOR ADDITIONAL REFILLS* (Patient not taking: Reported on 08/09/2021)   fenofibrate micronized (LOFIBRA) 134 MG capsule Take 134 mg by mouth daily. (Patient not taking: Reported on 08/09/2021)   lisinopril-hydrochlorothiazide (ZESTORETIC) 10-12.5 MG tablet Take 1 tablet by mouth daily. (Patient not taking: Reported on 08/09/2021)   metoprolol succinate (TOPROL-XL) 25 MG 24 hr tablet Take 25 mg by mouth daily. (Patient not taking: Reported on  08/09/2021)   No current facility-administered medications for this visit. (Other)   REVIEW OF SYSTEMS: ROS   Positive for: Cardiovascular, Eyes Negative for: Constitutional, Gastrointestinal, Neurological, Skin, Genitourinary, Musculoskeletal, HENT, Endocrine, Respiratory, Psychiatric, Allergic/Imm, Heme/Lymph Last edited by Corrinne Eagle on 10/18/2021  8:36 AM.    ALLERGIES No Known Allergies  PAST MEDICAL HISTORY Past Medical History:  Diagnosis Date   Abdominal aortic atherosclerosis (HCC)    Seen on catheterization   CAD, multiple vessel 05/18/2014   Coronary artery disease    Essential hypertension    NSTEMI (non-ST elevated myocardial infarction) (HCC) 05/17/2014   Post CABG Echo 09/19/2014: Normal EF 55-60% with mild Conc LVH. 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   LIMA to LAD, SVG to D1, Sequential SVG to PDA and RPL2, EVH via right thigh and leg   Past Surgical History:  Procedure Laterality Date   CARDIAC CATHETERIZATION  05/17/2014   DR HARDING: 95% proximal LAD, 90% ramus intermedius, 95% mid circumflex after OM1, neither percent RPDA   CORONARY ARTERY BYPASS GRAFT N/A 05/20/2014   Procedure: CORONARY ARTERY BYPASS GRAFTING (CABG) x 4,  LIMA-MID LAD,  SVG-DIAGONAL,  SEQUENTIAL SVG -PDA, PLB #2;  Surgeon: [name], MD;  Location: [facility] OR;  Service: Open Heart Surgery;  Laterality: N/A;   INTRAOPERATIVE TRANSESOPHAGEAL ECHOCARDIOGRAM N/A 05/20/2014   Procedure: INTRAOPERATIVE TRANSESOPHAGEAL ECHOCARDIOGRAM;  Surgeon: Salvatore Decent  [name], MD;  Location: [facility] OR;  Service: Open Heart Surgery;  Laterality: N/A;   LEFT HEART CATHETERIZATION WITH CORONARY ANGIOGRAM N/A 05/18/2014   Procedure: LEFT HEART CATHETERIZATION WITH CORONARY ANGIOGRAM;  Surgeon: [name], MD;  Location: [facility] CATH LAB;  Service: Cardiovascular;  Laterality: N/A;   TRANSTHORACIC ECHOCARDIOGRAM  09/14/2014   Normal EF 55-60% with mild Conc LVH. No regional WMA, essentially  normal   FAMILY HISTORY Family History  Problem Relation Age of Onset   Heart disease Brother        lives in [place]   Heart attack Neg Hx    Stroke Neg Hx    SOCIAL HISTORY Social History   Tobacco Use   Smoking status: Never   Smokeless tobacco: Never  Substance Use Topics   Alcohol use: Never   Drug use: Never       OPHTHALMIC EXAM: Base Eye Exam     Visual Acuity (Snellen - Linear)       Right Left   Dist [place] 20/30 -2 20/70 -2   Dist ph [place] 20/25 -2         Tonometry (Tonopen, 8:43 AM)       Right Left   Pressure 10 12         Pupils       Dark Light Shape React APD   Right 4 3 Round Brisk 0   Left 4 3 Round Brisk 0         Visual Fields       Left Right    Full Full         Extraocular Movement       Right Left    Full Full         Neuro/Psych     Oriented x3: Yes   Mood/Affect: Normal         Dilation     Both eyes: 1.0% Mydriacyl, 2.5% Phenylephrine @ 8:43 AM           Slit Lamp and Fundus Exam     Slit Lamp Exam       Right Left   Lids/Lashes Dermatochalasis - upper lid Dermatochalasis - upper lid   Conjunctiva/Sclera White and quiet Mild melanosis   Cornea Arcus, Well healed cataract wound Arcus, Well healed cataract wound   Anterior Chamber Deep and quiet Deep and quiet   Iris Round and dilated Round and dilated   Lens PCIOL in good position PCIOL in good position   Vitreous Vitreous syneresis, condensations Vitreous syneresis         Fundus Exam       Right Left   Disc Pink and sharp, mild PPA Pink and sharp   C/D Ratio 0.3 0.4   Macula Flat, Good foveal reflex, RPE mottling, no heme or edema IRH/CWS superior macula--improved, residual flame hemes ST macula improving   Vessels Vascular attenuation, Tortuous, Copper wiring & AV crossing changes slightly improved Vascular attenuation, Tortuous, flame hemes along ST arcades improving--BRVO/BRAO   Periphery Attached, no heme Attached, IRH superior midzone  (extension of ST BRVO)           IMAGING AND PROCEDURES  Imaging and Procedures for 10/18/2021  OCT, Retina - OU - Both Eyes       Right Eye Quality was good. Central Foveal Thickness: 262. Progression has been stable. Findings include normal foveal contour, no IRF, no SRF, retinal drusen .   Left Eye Quality was borderline. Central Foveal  Thickness: 287. Progression has improved. Findings include abnormal foveal contour, no SRF, intraretinal hyper-reflective material, inner retinal atrophy, no IRF (Interval improvement in superior and temporal IRF/IRA/IRHM--BRVO w/BRAO compenent.).   Notes *Images captured and stored on drive  Diagnosis / Impression:  OD: NFP, no IRF/SRF OS: Interval improvement in superior and temporal IRF/IRA/IRHM--BRVO w/ BRAO component.  Clinical management:  See below  Abbreviations: NFP - Normal foveal profile. CME - cystoid macular edema. PED - pigment epithelial detachment. IRF - intraretinal fluid. SRF - subretinal fluid. EZ - ellipsoid zone. ERM - epiretinal membrane. ORA - outer retinal atrophy. ORT - outer retinal tubulation. SRHM - subretinal hyper-reflective material. IRHM - intraretinal hyper-reflective material      Intravitreal Injection, Pharmacologic Agent - OS - Left Eye       Time Out 10/18/2021. 9:00 AM. Confirmed correct patient, procedure, site, and patient consented.   Anesthesia Topical anesthesia was used. Anesthetic medications included Lidocaine 2%, Proparacaine 0.5%.   Procedure Preparation included 5% betadine to ocular surface, eyelid speculum. A supplied needle was used.   Injection: 1.25 mg Bevacizumab 1.25mg /0.74ml   Route: Intravitreal, Site: Left Eye   NDC: [id number], Lot: [id number] , Expiration date: 12/06/2021, Waste: 0 mL   Post-op Post injection exam found visual acuity of at least counting fingers. The patient tolerated the procedure well. There were no complications. The patient received written and  verbal post procedure care education.             ASSESSMENT/PLAN:    ICD-10-CM   1. Branch retinal vein occlusion of left eye with macular edema  H34.8320 Intravitreal Injection, Pharmacologic Agent - OS - Left Eye    Bevacizumab (AVASTIN) SOLN 1.25 mg    2. Retinal edema  H35.81 OCT, Retina - OU - Both Eyes    3. Essential hypertension  I10     4. Hypertensive retinopathy of both eyes  H35.033     5. Pseudophakia of both eyes  Z96.1     1,2. BRVO w/ CME OS - s/p IVA OS #1 on 9.21.22 - pt was first seen on 8.10.22, but could not be treated with intravitreal injection because BP was extremely high R arm (199/122) and L arm (186/110) - pt was referred back to PCP, who has since restarted BP meds - BP today - 167/83 - BCVA OS 20/25 improved - OCT shows interval improvement in superior and temporal IRF/IRHM--BRVO w/ BRAO compenent (inner retinal atrophy and hyperreflectivity. - recommend IVA OS #2 today, 10.19.22 - Pt in agreement - RBA of procedure discussed, questions answered - informed consent obtained and signed - see procedure note  - f/u in 4 weeks, DFE/OCT, possible injection  3,4. Hypertensive retinopathy OU  - BP at presentation (8.10.22) were extremely high -- R arm (199/122) and L arm (186/110)  - pt was referred back to PCP, who has since restarted BP meds - discussed importance of tight BP control  5. Pseudophakia OU  - s/p CE/IOL OU (S. Groat)  - IOLs in good position, doing well  - post op drops per Dr. 10.10.22  - monitor  Ophthalmic Meds Ordered this visit:  Meds ordered this encounter  Medications   Bevacizumab (AVASTIN) SOLN 1.25 mg     Return in about 4 weeks (around 11/15/2021) for 4 weeks BRVO OS.  There are no Patient Instructions on file for this visit.  This document serves as a record of services personally performed by 11/17/2021, MD, PhD. It was created on their  behalf by De Blanch, an ophthalmic technician. The creation of  this record is the provider's dictation and/or activities during the visit.    Electronically signed by: De Blanch, OA, 10/18/21  9:19 AM   Karie Chimera, M.D., Ph.D. Diseases & Surgery of the Retina and Vitreous [facility]  I have reviewed the above documentation for accuracy and completeness, and I agree with the above. Karie Chimera, M.D., Ph.D. 10/18/21 9:19 AM   Abbreviations: M myopia (nearsighted); A astigmatism; H hyperopia (farsighted); P presbyopia; Mrx spectacle prescription;  CTL contact lenses; OD right eye; OS left eye; OU both eyes  XT exotropia; ET esotropia; PEK punctate epithelial keratitis; PEE punctate epithelial erosions; DES dry eye syndrome; MGD meibomian gland dysfunction; ATs artificial tears; PFAT's preservative free artificial tears; NSC nuclear sclerotic cataract; PSC posterior subcapsular cataract; ERM epi-retinal membrane; PVD posterior vitreous detachment; RD retinal detachment; DM diabetes mellitus; DR diabetic retinopathy; NPDR non-proliferative diabetic retinopathy; PDR proliferative diabetic retinopathy; CSME clinically significant macular edema; DME diabetic macular edema; dbh dot blot hemorrhages; CWS cotton wool spot; POAG primary open angle glaucoma; C/D cup-to-disc ratio; HVF humphrey visual field; GVF goldmann visual field; OCT optical coherence tomography; IOP intraocular pressure; BRVO Branch retinal vein occlusion; CRVO central retinal vein occlusion; CRAO central retinal artery occlusion; BRAO branch retinal artery occlusion; RT retinal tear; SB scleral buckle; PPV pars plana vitrectomy; VH Vitreous hemorrhage; PRP panretinal laser photocoagulation; IVK intravitreal kenalog; VMT vitreomacular traction; MH Macular hole;  NVD neovascularization of the disc; NVE neovascularization elsewhere; AREDS age related eye disease study; ARMD age related macular degeneration; POAG primary open angle glaucoma; EBMD epithelial/anterior  basement membrane dystrophy; ACIOL anterior chamber intraocular lens; IOL intraocular lens; PCIOL posterior chamber intraocular lens; Phaco/IOL phacoemulsification with intraocular lens placement; PRK photorefractive keratectomy; LASIK laser assisted in situ keratomileusis; HTN hypertension; DM diabetes mellitus; COPD chronic obstructive pulmonary disease

## 2021-10-18 ENCOUNTER — Encounter (INDEPENDENT_AMBULATORY_CARE_PROVIDER_SITE_OTHER): Payer: Self-pay | Admitting: Ophthalmology

## 2021-10-18 ENCOUNTER — Ambulatory Visit (INDEPENDENT_AMBULATORY_CARE_PROVIDER_SITE_OTHER): Payer: Medicare Other | Admitting: Ophthalmology

## 2021-10-18 ENCOUNTER — Other Ambulatory Visit: Payer: Self-pay

## 2021-10-18 VITALS — BP 167/83 | HR 83

## 2021-10-18 DIAGNOSIS — H35033 Hypertensive retinopathy, bilateral: Secondary | ICD-10-CM

## 2021-10-18 DIAGNOSIS — I10 Essential (primary) hypertension: Secondary | ICD-10-CM | POA: Diagnosis not present

## 2021-10-18 DIAGNOSIS — Z961 Presence of intraocular lens: Secondary | ICD-10-CM | POA: Diagnosis not present

## 2021-10-18 DIAGNOSIS — H34832 Tributary (branch) retinal vein occlusion, left eye, with macular edema: Secondary | ICD-10-CM

## 2021-10-18 DIAGNOSIS — H3581 Retinal edema: Secondary | ICD-10-CM

## 2021-10-18 MED ORDER — BEVACIZUMAB CHEMO INJECTION 1.25MG/0.05ML SYRINGE FOR KALEIDOSCOPE
1.2500 mg | INTRAVITREAL | Status: AC | PRN
  Administered 2021-10-18: 1.25 mg via INTRAVITREAL

## 2021-11-09 NOTE — Progress Notes (Signed)
[facility] Note  11/15/2021     CHIEF COMPLAINT Patient presents for Retina Follow Up   HISTORY OF PRESENT ILLNESS: Roger Howe is a 65 y.o. male who presents to the clinic today for:   HPI     Retina Follow Up   Patient presents with  CRVO/BRVO.  In left eye.  Severity is moderate.  Duration of 4 weeks.  Since onset it is gradually improving.  I, the attending physician,  performed the HPI with the patient and updated documentation appropriately.        Comments   Pt here for 4 wk ret f/u for BRVO OS. Pt states his vision has gotten better since previous visit. No ocular pain or discomfort.       Last edited by Bernarda Caffey, MD on 11/15/2021  1:06 PM.    Pt feels like vision has gotten better, he states he is able to read now   Referring physician: Sandi Mariscal, MD [place],  [place] [address]  HISTORICAL INFORMATION:  Selected notes from the MEDICAL RECORD NUMBER Referred by Dr. Katy Fitch for eval of BRVO OS   CURRENT MEDICATIONS: No current outpatient medications on file. (Ophthalmic Drugs)   No current facility-administered medications for this visit. (Ophthalmic Drugs)   Current Outpatient Medications (Other)  Medication Sig   aspirin 81 MG chewable tablet Chew 81 mg by mouth daily.   atorvastatin (LIPITOR) 20 MG tablet Take 20 mg by mouth daily at 6 PM.   fenofibrate micronized (LOFIBRA) 134 MG capsule Take 134 mg by mouth daily.   lisinopril-hydrochlorothiazide (ZESTORETIC) 10-12.5 MG tablet Take 1 tablet by mouth daily.   carvedilol (COREG) 3.125 MG tablet TAKE 1 TABLET BY MOUTH TWICE A DAY *PLS CALL OFFICE FOR ADDITIONAL REFILLS* (Patient not taking: Reported on 11/15/2021)   metoprolol succinate (TOPROL-XL) 25 MG 24 hr tablet Take 25 mg by mouth daily. (Patient not taking: Reported on 11/15/2021)   No current facility-administered medications for this visit. (Other)   REVIEW OF SYSTEMS: ROS   Positive for:  Cardiovascular, Eyes Negative for: Constitutional, Gastrointestinal, Neurological, Skin, Genitourinary, Musculoskeletal, HENT, Endocrine, Respiratory, Psychiatric, Allergic/Imm, Heme/Lymph Last edited by Kingsley Spittle, COT on 11/15/2021  9:09 AM.     ALLERGIES No Known Allergies  PAST MEDICAL HISTORY Past Medical History:  Diagnosis Date   Abdominal aortic atherosclerosis ([place])    Seen on catheterization   CAD, multiple vessel 05/18/2014   Coronary artery disease    Essential hypertension    Hypertensive retinopathy    NSTEMI (non-ST elevated myocardial infarction) ([place]) 05/17/2014   Post CABG Echo 09/19/2014: Normal EF 55-60% with mild Conc LVH. 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   LIMA to LAD, SVG to D1, Sequential SVG to PDA and RPL2, EVH via right thigh and leg   Past Surgical History:  Procedure Laterality Date   CARDIAC CATHETERIZATION  05/17/2014   DR HARDING: 95% proximal LAD, 90% ramus intermedius, 95% mid circumflex after OM1, neither percent RPDA   CORONARY ARTERY BYPASS GRAFT N/A 05/20/2014   Procedure: CORONARY ARTERY BYPASS GRAFTING (CABG) x 4,  LIMA-MID LAD,  SVG-DIAGONAL,  SEQUENTIAL SVG -PDA, PLB #2;  Surgeon: Rexene Alberts, MD;  Location: [facility];  Service: Open Heart Surgery;  Laterality: N/A;   INTRAOPERATIVE TRANSESOPHAGEAL ECHOCARDIOGRAM N/A 05/20/2014   Procedure: INTRAOPERATIVE TRANSESOPHAGEAL ECHOCARDIOGRAM;  Surgeon: Rexene Alberts, MD;  Location: [facility];  Service: Open Heart Surgery;  Laterality: N/A;   LEFT HEART CATHETERIZATION WITH CORONARY ANGIOGRAM N/A 05/18/2014   Procedure: LEFT HEART CATHETERIZATION WITH CORONARY ANGIOGRAM;  Surgeon: Leonie Man, MD;  Location[facility] LAB;  Service: Cardiovascular;  Laterality: N/A;   TRANSTHORACIC ECHOCARDIOGRAM  09/14/2014   Normal EF 55-60% with mild Conc LVH. No regional WMA, essentially normal   FAMILY HISTORY Family History  Problem Relation Age of Onset   Heart disease  Brother        lives in [place]   Heart attack Neg Hx    Stroke Neg Hx    SOCIAL HISTORY Social History   Tobacco Use   Smoking status: Never   Smokeless tobacco: Never  Substance Use Topics   Alcohol use: Never   Drug use: Never       OPHTHALMIC EXAM: Base Eye Exam     Visual Acuity (Snellen - Linear)       Right Left   Dist [place] 20/30 20/70 -1   Dist ph [place] NI 20/30 -1         Tonometry (Tonopen, 9:20 AM)       Right Left   Pressure 14 13         Pupils       Dark Light Shape React APD   Right 4 3 Round Brisk None   Left 3 2 Round Brisk None         Visual Fields (Counting fingers)       Left Right    Full Full         Extraocular Movement       Right Left    Full, Ortho LET, Ortho    -- -- --  --  --  -- -- --   0 0 0  0  --  0 0 0           Neuro/Psych     Oriented x3: Yes   Mood/Affect: Normal         Dilation     Both eyes: 1.0% Mydriacyl, 2.5% Phenylephrine @ 9:19 AM           Slit Lamp and Fundus Exam     Slit Lamp Exam       Right Left   Lids/Lashes Dermatochalasis - upper lid Dermatochalasis - upper lid   Conjunctiva/Sclera White and quiet Mild melanosis   Cornea Arcus, Well healed cataract wound Arcus, Well healed cataract wound   Anterior Chamber Deep and quiet Deep and quiet   Iris Round and dilated round and poorly dilated   Lens PCIOL in good position PCIOL in good position   Vitreous Vitreous syneresis, condensations Vitreous syneresis         Fundus Exam       Right Left   Disc Pink and sharp, mild PPA Pink and sharp   C/D Ratio 0.3 0.4   Macula Flat, Good foveal reflex, RPE mottling, no heme or edema IRH/CWS superior macula--improved, residual flame hemes ST macula improving   Vessels Vascular attenuation, Tortuous, Copper wiring & AV crossing changes slightly improved Vascular attenuation, Tortuous, flame hemes along ST arcades improving--BRVO/BRAO   Periphery Attached, no heme Attached, IRH  superior midzone (extension of ST BRVO)           IMAGING AND PROCEDURES  Imaging and Procedures for 11/15/2021  OCT, Retina - OU - Both Eyes       Right Eye Quality was good. Central Foveal Thickness: 265. Progression has been stable.  Findings include normal foveal contour, no IRF, no SRF, retinal drusen .   Left Eye Quality was borderline. Central Foveal Thickness: 284. Progression has been stable. Findings include abnormal foveal contour, no SRF, intraretinal hyper-reflective material, inner retinal atrophy, no IRF (Stable improvement in superior and temporal IRF/IRA/IRHM--BRVO w/BRAO compenent.).   Notes *Images captured and stored on drive  Diagnosis / Impression:  OD: NFP, no IRF/SRF OS: stable improvement in superior and temporal IRF/IRA/IRHM--BRVO w/ BRAO component., partial PVD  Clinical management:  See below  Abbreviations: NFP - Normal foveal profile. CME - cystoid macular edema. PED - pigment epithelial detachment. IRF - intraretinal fluid. SRF - subretinal fluid. EZ - ellipsoid zone. ERM - epiretinal membrane. ORA - outer retinal atrophy. ORT - outer retinal tubulation. SRHM - subretinal hyper-reflective material. IRHM - intraretinal hyper-reflective material      Intravitreal Injection, Pharmacologic Agent - OS - Left Eye       Time Out 11/15/2021. 9:48 AM. Confirmed correct patient, procedure, site, and patient consented.   Anesthesia Topical anesthesia was used. Anesthetic medications included Lidocaine 2%, Proparacaine 0.5%.   Procedure Preparation included 5% betadine to ocular surface, eyelid speculum. A (32g) needle was used.   Injection: 1.25 mg Bevacizumab 1.17m/0.05ml   Route: Intravitreal, Site: Left Eye   NDC:: 28315-176-16 Lot: [id number], Expiration date: 01/03/2022, Waste: 0.05 mL   Post-op Post injection exam found visual acuity of at least counting fingers. The patient tolerated the procedure well. There were no complications. The patient  received written and verbal post procedure care education.            ASSESSMENT/PLAN:    ICD-10-CM   1. Branch retinal vein occlusion of left eye with macular edema  H34.8320 Intravitreal Injection, Pharmacologic Agent - OS - Left Eye    Bevacizumab (AVASTIN) SOLN 1.25 mg    2. Retinal edema  H35.81 OCT, Retina - OU - Both Eyes    3. Essential hypertension  I10     4. Hypertensive retinopathy of both eyes  H35.033     5. Pseudophakia of both eyes  Z96.1      1,2. BRVO w/ CME OS - s/p IVA OS #1 (9.21.22), #2 (10.19.22) - pt was first seen on 8.10.22, but could not be treated with intravitreal injection because BP was extremely high R arm (199/122) and L arm (186/110) - pt was referred back to PCP, who has since restarted BP meds - BP today - 139/88 - BCVA OS 20/30 stable - OCT shows stable improvement in superior and temporal IRF/IRHM--BRVO w/ BRAO compenent (inner retinal atrophy and hyperreflectivity) - recommend IVA OS #3 today, 11.16.22 -- maintenance w/ ext to 6 wks - Pt in agreement - RBA of procedure discussed, questions answered - informed consent obtained and signed - see procedure note - f/u in 6 weeks, DFE/OCT, possible injection  3,4. Hypertensive retinopathy OU  - BP at presentation (8.10.22) were extremely high -- R arm (199/122) and L arm (186/110)  - pt was referred back to PCP, who has since restarted BP meds - discussed importance of tight BP control  5. Pseudophakia OU  - s/p CE/IOL OU ([place]. [place])  - IOLs in good position, doing well  - post op drops per Dr. GKaty Fitch - monitor  Ophthalmic Meds Ordered this visit:  Meds ordered this encounter  Medications   Bevacizumab (AVASTIN) SOLN 1.25 mg      Return in about 6 weeks (around 12/27/2021) for superior BRVO/BRAO OS, Dilated Exam,  OCT, Possible Injxn.  There are no Patient Instructions on file for this visit.  This document serves as a record of services personally performed by Gardiner Sleeper,  MD, PhD. It was created on their behalf by Orvan Falconer, an ophthalmic technician. The creation of this record is the provider's dictation and/or activities during the visit.    Electronically signed by: Orvan Falconer, OA, 11/15/21  1:08 PM   This document serves as a record of services personally performed by Gardiner Sleeper, MD, PhD. It was created on their behalf by San Jetty. Owens Shark, OA an ophthalmic technician. The creation of this record is the provider's dictation and/or activities during the visit.    Electronically signed by: San Jetty. Owens Shark, [place] 11.16.2022 1:08 PM   Gardiner Sleeper, M.D., Ph.D. Diseases & Surgery of the Retina and Vitreous [facility]  I have reviewed the above documentation for accuracy and completeness, and I agree with the above. Gardiner Sleeper, M.D., Ph.D. 11/15/21 1:08 PM   Abbreviations: M myopia (nearsighted); A astigmatism; H hyperopia (farsighted); P presbyopia; Mrx spectacle prescription;  CTL contact lenses; OD right eye; OS left eye; OU both eyes  XT exotropia; ET esotropia; PEK punctate epithelial keratitis; PEE punctate epithelial erosions; DES dry eye syndrome; MGD meibomian gland dysfunction; ATs artificial tears; PFAT's preservative free artificial tears; [place] nuclear sclerotic cataract; PSC posterior subcapsular cataract; ERM epi-retinal membrane; PVD posterior vitreous detachment; RD retinal detachment; DM diabetes mellitus; DR diabetic retinopathy; NPDR non-proliferative diabetic retinopathy; PDR proliferative diabetic retinopathy; CSME clinically significant macular edema; DME diabetic macular edema; dbh dot blot hemorrhages; CWS cotton wool spot; POAG primary open angle glaucoma; C/D cup-to-disc ratio; HVF humphrey visual field; GVF goldmann visual field; OCT optical coherence tomography; IOP intraocular pressure; BRVO Branch retinal vein occlusion; CRVO central retinal vein occlusion; CRAO central retinal artery occlusion;  BRAO branch retinal artery occlusion; RT retinal tear; SB scleral buckle; PPV pars plana vitrectomy; VH Vitreous hemorrhage; PRP panretinal laser photocoagulation; IVK intravitreal kenalog; VMT vitreomacular traction; MH Macular hole;  NVD neovascularization of the disc; NVE neovascularization elsewhere; AREDS age related eye disease study; ARMD age related macular degeneration; POAG primary open angle glaucoma; EBMD epithelial/anterior basement membrane dystrophy; ACIOL anterior chamber intraocular lens; IOL intraocular lens; PCIOL posterior chamber intraocular lens; Phaco/IOL phacoemulsification with intraocular lens placement; [place] photorefractive keratectomy; LASIK laser assisted in situ keratomileusis; HTN hypertension; DM diabetes mellitus; COPD chronic obstructive pulmonary disease

## 2021-11-15 ENCOUNTER — Other Ambulatory Visit: Payer: Self-pay

## 2021-11-15 ENCOUNTER — Encounter (INDEPENDENT_AMBULATORY_CARE_PROVIDER_SITE_OTHER): Payer: Self-pay | Admitting: Ophthalmology

## 2021-11-15 ENCOUNTER — Ambulatory Visit (INDEPENDENT_AMBULATORY_CARE_PROVIDER_SITE_OTHER): Payer: Medicare Other | Admitting: Ophthalmology

## 2021-11-15 VITALS — BP 138/88 | HR 89

## 2021-11-15 DIAGNOSIS — Z961 Presence of intraocular lens: Secondary | ICD-10-CM | POA: Diagnosis not present

## 2021-11-15 DIAGNOSIS — H34832 Tributary (branch) retinal vein occlusion, left eye, with macular edema: Secondary | ICD-10-CM

## 2021-11-15 DIAGNOSIS — H35033 Hypertensive retinopathy, bilateral: Secondary | ICD-10-CM

## 2021-11-15 DIAGNOSIS — I10 Essential (primary) hypertension: Secondary | ICD-10-CM

## 2021-11-15 DIAGNOSIS — H3581 Retinal edema: Secondary | ICD-10-CM

## 2021-11-15 MED ORDER — BEVACIZUMAB CHEMO INJECTION 1.25MG/0.05ML SYRINGE FOR KALEIDOSCOPE
1.2500 mg | INTRAVITREAL | Status: AC | PRN
  Administered 2021-11-15: 1.25 mg via INTRAVITREAL

## 2021-12-21 NOTE — Progress Notes (Signed)
[facility] - Clinic Note  12/27/2021     CHIEF COMPLAINT Patient presents for Retina Follow Up  HISTORY OF PRESENT ILLNESS: Roger Howe is a 65 y.o. male who presents to the clinic today for:   HPI     Retina Follow Up   Patient presents with  CRVO/BRVO.  In left eye.  Severity is moderate.  Duration of 6 weeks.  Since onset it is gradually improving.  I, the attending physician,  performed the HPI with the patient and updated documentation appropriately.        Comments   Patient states vision improving OS.       Last edited by [name], MD on 12/27/2021 12:13 PM.      Referring physician: Salli Real, MD [address] [place],  [place] [address]  HISTORICAL INFORMATION:  Selected notes from the MEDICAL RECORD NUMBER Referred by Dr. [name] for eval of BRVO OS   CURRENT MEDICATIONS: No current outpatient medications on file. (Ophthalmic Drugs)   No current facility-administered medications for this visit. (Ophthalmic Drugs)   Current Outpatient Medications (Other)  Medication Sig   aspirin 81 MG chewable tablet Chew 81 mg by mouth daily.   atorvastatin (LIPITOR) 20 MG tablet Take 20 mg by mouth daily at 6 PM.   fenofibrate micronized (LOFIBRA) 134 MG capsule Take 134 mg by mouth daily.   lisinopril-hydrochlorothiazide (ZESTORETIC) 10-12.5 MG tablet Take 1 tablet by mouth daily.   carvedilol (COREG) 3.125 MG tablet TAKE 1 TABLET BY MOUTH TWICE A DAY *PLS CALL OFFICE FOR ADDITIONAL REFILLS* (Patient not taking: Reported on 11/15/2021)   metoprolol succinate (TOPROL-XL) 25 MG 24 hr tablet Take 25 mg by mouth daily. (Patient not taking: Reported on 11/15/2021)   No current facility-administered medications for this visit. (Other)   REVIEW OF SYSTEMS: ROS   Positive for: Cardiovascular, Eyes Negative for: Constitutional, Gastrointestinal, Neurological, Skin, Genitourinary, Musculoskeletal, HENT, Endocrine, Respiratory, Psychiatric,  Allergic/Imm, Heme/Lymph Last edited by Annalee Genta D, COT on 12/27/2021  9:00 AM.     ALLERGIES No Known Allergies  PAST MEDICAL HISTORY Past Medical History:  Diagnosis Date   Abdominal aortic atherosclerosis (HCC)    Seen on catheterization   CAD, multiple vessel 05/18/2014   Coronary artery disease    Essential hypertension    Hypertensive retinopathy    NSTEMI (non-ST elevated myocardial infarction) (HCC) 05/17/2014   Post CABG Echo 09/19/2014: Normal EF 55-60% with mild Conc LVH. 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   LIMA to LAD, SVG to D1, Sequential SVG to PDA and RPL2, EVH via right thigh and leg   Past Surgical History:  Procedure Laterality Date   CARDIAC CATHETERIZATION  05/17/2014   DR HARDING: 95% proximal LAD, 90% ramus intermedius, 95% mid circumflex after OM1, neither percent RPDA   CORONARY ARTERY BYPASS GRAFT N/A 05/20/2014   Procedure: CORONARY ARTERY BYPASS GRAFTING (CABG) x 4,  LIMA-MID LAD,  SVG-DIAGONAL,  SEQUENTIAL SVG -PDA, PLB #2;  Surgeon: [name], MD;  Location: [facility] OR;  Service: Open Heart Surgery;  Laterality: N/A;   INTRAOPERATIVE TRANSESOPHAGEAL ECHOCARDIOGRAM N/A 05/20/2014   Procedure: INTRAOPERATIVE TRANSESOPHAGEAL ECHOCARDIOGRAM;  Surgeon: [name], MD;  Location: [facility] OR;  Service: Open Heart Surgery;  Laterality: N/A;   LEFT HEART CATHETERIZATION WITH CORONARY ANGIOGRAM N/A 05/18/2014   Procedure: LEFT HEART CATHETERIZATION WITH CORONARY ANGIOGRAM;  Surgeon: [name], MD;  Location: [facility] CATH LAB;  Service: Cardiovascular;  Laterality: N/A;   TRANSTHORACIC ECHOCARDIOGRAM  09/14/2014   Normal EF 55-60% with mild Conc LVH. No regional WMA, essentially normal   FAMILY HISTORY Family History  Problem Relation Age of Onset   Heart disease Brother        lives in [place]   Heart attack Neg Hx    Stroke Neg Hx    SOCIAL HISTORY Social History   Tobacco Use   Smoking status: Never   Smokeless tobacco:  Never  Substance Use Topics   Alcohol use: Never   Drug use: Never       OPHTHALMIC EXAM: Base Eye Exam     Visual Acuity (Snellen - Linear)       Right Left   Dist [place] 20/40 +2 20/50 +1   Dist ph [place] 20/30 20/25 -2         Tonometry (Tonopen, 9:13 AM)       Right Left   Pressure 14 14         Pupils       Dark Light Shape React APD   Right 4 3 Round Brisk None   Left 4 3 Round Brisk None         Visual Fields (Counting fingers)       Left Right    Full Full         Extraocular Movement       Right Left    Full, Ortho Full, Ortho         Neuro/Psych     Oriented x3: Yes   Mood/Affect: Normal         Dilation     Both eyes: 1.0% Mydriacyl, 2.5% Phenylephrine @ 9:13 AM           Slit Lamp and Fundus Exam     Slit Lamp Exam       Right Left   Lids/Lashes Dermatochalasis - upper lid Dermatochalasis - upper lid   Conjunctiva/Sclera White and quiet Mild melanosis   Cornea Arcus, Well healed cataract wound Arcus, Well healed cataract wound   Anterior Chamber Deep and quiet Deep and quiet   Iris Round and dilated round and poorly dilated   Lens PCIOL in good position PCIOL in good position   Anterior Vitreous Vitreous syneresis, condensations Vitreous syneresis         Fundus Exam       Right Left   Disc Pink and sharp, mild PPA Pink and sharp   C/D Ratio 0.3 0.4   Macula Flat, Good foveal reflex, RPE mottling, no heme or edema Good foveal reflex, IRH/CWS superior macula--improved, residual flame hemes ST macula improving--almost resolved   Vessels Vascular attenuation, Tortuous, Copper wiring & AV crossing changes slightly improved Vascular attenuation, Tortuous, flame hemes along ST arcades improving--BRVO/BRAO   Periphery Attached, no heme Attached, IRH superior midzone (extension of ST BRVO)           IMAGING AND PROCEDURES  Imaging and Procedures for 12/27/2021  OCT, Retina - OU - Both Eyes       Right Eye Quality  was good. Central Foveal Thickness: 267. Progression has been stable. Findings include normal foveal contour, no IRF, no SRF, retinal drusen .   Left Eye Quality was borderline. Central Foveal Thickness: 289. Progression has been stable. Findings include abnormal foveal contour, no SRF, intraretinal hyper-reflective material, inner retinal atrophy, no IRF (Stable improvement in superior and temporal IRF/IRA/IRHM--BRVO w/BRAO compenent.).   Notes *Images captured and stored on  drive  Diagnosis / Impression:  OD: NFP, no IRF/SRF OS: stable improvement in superior and temporal IRF/IRA/IRHM--BRVO w/ BRAO component., partial PVD  Clinical management:  See below  Abbreviations: NFP - Normal foveal profile. CME - cystoid macular edema. PED - pigment epithelial detachment. IRF - intraretinal fluid. SRF - subretinal fluid. EZ - ellipsoid zone. ERM - epiretinal membrane. ORA - outer retinal atrophy. ORT - outer retinal tubulation. SRHM - subretinal hyper-reflective material. IRHM - intraretinal hyper-reflective material      Intravitreal Injection, Pharmacologic Agent - OS - Left Eye       Time Out 12/27/2021. 9:43 AM. Confirmed correct patient, procedure, site, and patient consented.   Anesthesia Topical anesthesia was used. Anesthetic medications included Lidocaine 2%, Proparacaine 0.5%.   Procedure Preparation included 5% betadine to ocular surface, eyelid speculum. A supplied (32g) needle was used.   Injection: 1.25 mg Bevacizumab 1.25mg /0.53ml   Route: Intravitreal, Site: Left Eye   NDC: 55732-202-54, Lot: [id number] , Expiration date: 02/06/2022, Waste: 0 mL   Post-op Post injection exam found visual acuity of at least counting fingers. The patient tolerated the procedure well. There were no complications. The patient received written and verbal post procedure care education. Post injection medications were not given.            ASSESSMENT/PLAN:    ICD-10-CM   1. 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 Essential hypertension  I10     3. Hypertensive retinopathy of both eyes  H35.033     4. Pseudophakia of both eyes  Z96.1      1. BRVO w/ CME OS - s/p IVA OS #1 (9.21.22), #2 (10.19.22), #3 (11.16.22) - pt was first seen on 8.10.22, but could not be treated with intravitreal injection because BP was extremely high R arm (199/122) and L arm (186/110) - pt was referred back to PCP, who has since restarted BP meds - BCVA OS 20/25 (improved from 20/30) - OCT shows stable improvement in superior and temporal IRF/IRHM--BRVO w/ BRAO compenent (inner retinal atrophy and hyperreflectivity) - recommend IVA OS #4 today, 12.28.22 -- maintenance w/ ext to 8 wks - Pt in agreement - RBA of procedure discussed, questions answered - informed consent obtained and signed - see procedure note - f/u in 8 weeks, DFE/OCT, possible injection  2,3. Hypertensive retinopathy OU  - BP at presentation (8.10.22) were extremely high -- R arm (199/122) and L arm (186/110)  - pt was referred back to PCP, who has since restarted BP meds - discussed importance of tight BP control  4. Pseudophakia OU  - s/p CE/IOL OU (S. [place])  - IOLs in good position, doing well  - post op drops per Dr. 10.10.22  - monitor  Ophthalmic Meds Ordered this visit:  Meds ordered this encounter  Medications   Bevacizumab (AVASTIN) SOLN 1.25 mg     Return in about 8 weeks (around 02/21/2022) for 8 wk f/u for BRVO w/CME OS w/DFE/OCT/likely IVA OS.  There are no Patient Instructions on file for this visit.  This document serves as a record of services personally performed by 02/23/2022, MD, PhD. It was created on their behalf by Karie Chimera, COT an ophthalmic technician. The creation of this record is the provider's dictation and/or  activities during  the visit.    Electronically signed by: Cristopher Estimable, [place] 12.28.22 @ 12:15 PM   Karie Chimera, M.D., Ph.D. Diseases & Surgery of the Retina and Vitreous [facility] 12.28.22  I have reviewed the above documentation for accuracy and completeness, and I agree with the above. Karie Chimera, M.D., Ph.D. 12/27/21 12:15 PM   Abbreviations: M myopia (nearsighted); A astigmatism; H hyperopia (farsighted); P presbyopia; Mrx spectacle prescription;  CTL contact lenses; OD right eye; OS left eye; OU both eyes  XT exotropia; ET esotropia; PEK punctate epithelial keratitis; PEE punctate epithelial erosions; DES dry eye syndrome; MGD meibomian gland dysfunction; ATs artificial tears; PFAT's preservative free artificial tears; NSC nuclear sclerotic cataract; PSC posterior subcapsular cataract; ERM epi-retinal membrane; PVD posterior vitreous detachment; RD retinal detachment; DM diabetes mellitus; DR diabetic retinopathy; NPDR non-proliferative diabetic retinopathy; PDR proliferative diabetic retinopathy; CSME clinically significant macular edema; DME diabetic macular edema; dbh dot blot hemorrhages; CWS cotton wool spot; POAG primary open angle glaucoma; C/D cup-to-disc ratio; HVF humphrey visual field; GVF goldmann visual field; OCT optical coherence tomography; IOP intraocular pressure; BRVO Branch retinal vein occlusion; CRVO central retinal vein occlusion; CRAO central retinal artery occlusion; BRAO branch retinal artery occlusion; RT retinal tear; SB scleral buckle; PPV pars plana vitrectomy; VH Vitreous hemorrhage; PRP panretinal laser photocoagulation; IVK intravitreal kenalog; VMT vitreomacular traction; MH Macular hole;  NVD neovascularization of the disc; NVE neovascularization elsewhere; AREDS age related eye disease study; ARMD age related macular degeneration; POAG primary open angle glaucoma; EBMD epithelial/anterior basement membrane dystrophy; ACIOL  anterior chamber intraocular lens; IOL intraocular lens; PCIOL posterior chamber intraocular lens; Phaco/IOL phacoemulsification with intraocular lens placement; PRK photorefractive keratectomy; LASIK laser assisted in situ keratomileusis; HTN hypertension; DM diabetes mellitus; COPD chronic obstructive pulmonary disease

## 2021-12-27 ENCOUNTER — Encounter (INDEPENDENT_AMBULATORY_CARE_PROVIDER_SITE_OTHER): Payer: Self-pay | Admitting: Ophthalmology

## 2021-12-27 ENCOUNTER — Ambulatory Visit (INDEPENDENT_AMBULATORY_CARE_PROVIDER_SITE_OTHER): Payer: Medicare Other | Admitting: Ophthalmology

## 2021-12-27 ENCOUNTER — Other Ambulatory Visit: Payer: Self-pay

## 2021-12-27 DIAGNOSIS — H35033 Hypertensive retinopathy, bilateral: Secondary | ICD-10-CM | POA: Diagnosis not present

## 2021-12-27 DIAGNOSIS — I10 Essential (primary) hypertension: Secondary | ICD-10-CM

## 2021-12-27 DIAGNOSIS — H34832 Tributary (branch) retinal vein occlusion, left eye, with macular edema: Secondary | ICD-10-CM | POA: Diagnosis not present

## 2021-12-27 DIAGNOSIS — Z961 Presence of intraocular lens: Secondary | ICD-10-CM

## 2021-12-27 MED ORDER — BEVACIZUMAB CHEMO INJECTION 1.25MG/0.05ML SYRINGE FOR KALEIDOSCOPE
1.2500 mg | INTRAVITREAL | Status: AC | PRN
Start: 2021-12-27 — End: 2021-12-27
  Administered 2021-12-27: 12:00:00 1.25 mg via INTRAVITREAL

## 2022-02-14 NOTE — Progress Notes (Signed)
[facility] Note  02/21/2022     CHIEF COMPLAINT Patient presents for Retina Follow Up  HISTORY OF PRESENT ILLNESS: Roger Howe is a 66 y.o. male who presents to the clinic today for:   HPI     Retina Follow Up   Patient presents with  CRVO/BRVO.  In left eye.  Severity is moderate.  Duration of 8 weeks.  Since onset it is stable.  I, the attending physician,  performed the HPI with the patient and updated documentation appropriately.        Comments   Patient states vision slightly worse OS.      Last edited by Bernarda Caffey, MD on 02/21/2022  4:57 PM.     Referring physician: Sandi Mariscal, MD [place],  [place] [id number]  HISTORICAL INFORMATION:  Selected notes from the MEDICAL RECORD NUMBER Referred by Dr. Katy Fitch for eval of BRVO OS   CURRENT MEDICATIONS: No current outpatient medications on file. (Ophthalmic Drugs)   No current facility-administered medications for this visit. (Ophthalmic Drugs)   Current Outpatient Medications (Other)  Medication Sig   aspirin 81 MG chewable tablet Chew 81 mg by mouth daily.   atorvastatin (LIPITOR) 20 MG tablet Take 20 mg by mouth daily at 6 PM.   fenofibrate micronized (LOFIBRA) 134 MG capsule Take 134 mg by mouth daily.   lisinopril-hydrochlorothiazide (ZESTORETIC) 10-12.5 MG tablet Take 1 tablet by mouth daily.   carvedilol (COREG) 3.125 MG tablet TAKE 1 TABLET BY MOUTH TWICE A DAY *PLS CALL OFFICE FOR ADDITIONAL REFILLS* (Patient not taking: Reported on 11/15/2021)   metoprolol succinate (TOPROL-XL) 25 MG 24 hr tablet Take 25 mg by mouth daily. (Patient not taking: Reported on 11/15/2021)   No current facility-administered medications for this visit. (Other)   REVIEW OF SYSTEMS: ROS   Positive for: Cardiovascular, Eyes Negative for: Constitutional, Gastrointestinal, Neurological, Skin, Genitourinary, Musculoskeletal, HENT, Endocrine, Respiratory, Psychiatric, Allergic/Imm,  Heme/Lymph Last edited by Roselee Nova D, COT on 02/21/2022  9:02 AM.     ALLERGIES No Known Allergies  PAST MEDICAL HISTORY Past Medical History:  Diagnosis Date   Abdominal aortic atherosclerosis ([place])    Seen on catheterization   CAD, multiple vessel 05/18/2014   Coronary artery disease    Essential hypertension    Hypertensive retinopathy    NSTEMI (non-ST elevated myocardial infarction) ([place]) 05/17/2014   Post CABG Echo 09/19/2014: Normal EF 55-60% with mild Conc LVH. 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   LIMA to LAD, SVG to D1, Sequential SVG to PDA and RPL2, EVH via right thigh and leg   Past Surgical History:  Procedure Laterality Date   CARDIAC CATHETERIZATION  05/17/2014   DR HARDING: 95% proximal LAD, 90% ramus intermedius, 95% mid circumflex after OM1, neither percent RPDA   CORONARY ARTERY BYPASS GRAFT N/A 05/20/2014   Procedure: CORONARY ARTERY BYPASS GRAFTING (CABG) x 4,  LIMA-MID LAD,  SVG-DIAGONAL,  SEQUENTIAL SVG -PDA, PLB #2;  Surgeon: Rexene Alberts, MD;  Location: [facility];  Service: Open Heart Surgery;  Laterality: N/A;   INTRAOPERATIVE TRANSESOPHAGEAL ECHOCARDIOGRAM N/A 05/20/2014   Procedure: INTRAOPERATIVE TRANSESOPHAGEAL ECHOCARDIOGRAM;  Surgeon: Rexene Alberts, MD;  Location: [facility];  Service: Open Heart Surgery;  Laterality: N/A;   LEFT HEART CATHETERIZATION WITH CORONARY ANGIOGRAM N/A 05/18/2014   Procedure: LEFT HEART CATHETERIZATION WITH CORONARY ANGIOGRAM;  Surgeon: Leonie Man, MD;  Location: [facility] CATH LAB;  Service: Cardiovascular;  Laterality:  N/A;   TRANSTHORACIC ECHOCARDIOGRAM  09/14/2014   Normal EF 55-60% with mild Conc LVH. No regional WMA, essentially normal   FAMILY HISTORY Family History  Problem Relation Age of Onset   Heart disease Brother        lives in [place]   Heart attack Neg Hx    Stroke Neg Hx    SOCIAL HISTORY Social History   Tobacco Use   Smoking status: Never   Smokeless tobacco: Never  Substance  Use Topics   Alcohol use: Never   Drug use: Never       OPHTHALMIC EXAM: Base Eye Exam     Visual Acuity (Snellen - Linear)       Right Left   Dist cc 20/25 -2 20/25   Dist ph cc NI NI         Tonometry (Tonopen, 9:13 AM)       Right Left   Pressure 15 18         Pupils       Dark Light Shape React APD   Right 4 3 Round Brisk None   Left 4 3 Round Brisk None         Visual Fields (Counting fingers)       Left Right    Full Full         Extraocular Movement       Right Left    Full, Ortho Full, Ortho         Neuro/Psych     Oriented x3: Yes   Mood/Affect: Normal         Dilation     Both eyes: 1.0% Mydriacyl, 2.5% Phenylephrine @ 9:13 AM           Slit Lamp and Fundus Exam     Slit Lamp Exam       Right Left   Lids/Lashes Dermatochalasis - upper lid Dermatochalasis - upper lid   Conjunctiva/Sclera White and quiet Mild melanosis   Cornea Arcus, Well healed cataract wound Arcus, Well healed cataract wound   Anterior Chamber Deep and quiet Deep and quiet   Iris Round and dilated round and poorly dilated   Lens PCIOL in good position PCIOL in good position   Anterior Vitreous Vitreous syneresis, condensations Vitreous syneresis         Fundus Exam       Right Left   Disc Pink and sharp, mild PPA Pink and sharp, mild temporal Pallor   C/D Ratio 0.3 0.4   Macula Flat, Good foveal reflex, RPE mottling, no heme or edema Good foveal reflex, IRH/CWS superior macula -- improved, residual flame hemes ST macula -- resolved, +ERM SN macula   Vessels Vascular attenuation, Tortuous, Copper wiring & AV crossing changes slightly improved Vascular attenuation, Tortuous, flame hemes along ST arcades improving--BRVO/BRAO   Periphery Attached, no heme Attached, IRH superior midzone (extension of ST BRVO)           IMAGING AND PROCEDURES  Imaging and Procedures for 02/21/2022  OCT, Retina - OU - Both Eyes       Right Eye Quality was  good. Central Foveal Thickness: 268. Progression has been stable. Findings include normal foveal contour, no IRF, no SRF, retinal drusen .   Left Eye Quality was borderline. Central Foveal Thickness: 335. Progression has been stable. Findings include abnormal foveal contour, no SRF, intraretinal hyper-reflective material, inner retinal atrophy, no IRF, epiretinal membrane (Stable improvement in superior and temporal IRF/IRA/IRHM--BRVO w/BRAO component, mild  interval progression of ERM).   Notes *Images captured and stored on drive  Diagnosis / Impression:  OD: NFP, no IRF/SRF OS: Stable improvement in superior and temporal IRF/IRA/IRHM--BRVO w/BRAO component, mild interval progression of ERM  Clinical management:  See below  Abbreviations: NFP - Normal foveal profile. CME - cystoid macular edema. PED - pigment epithelial detachment. IRF - intraretinal fluid. SRF - subretinal fluid. EZ - ellipsoid zone. ERM - epiretinal membrane. ORA - outer retinal atrophy. ORT - outer retinal tubulation. SRHM - subretinal hyper-reflective material. IRHM - intraretinal hyper-reflective material      Intravitreal Injection, Pharmacologic Agent - OS - Left Eye       Time Out 02/21/2022. 10:06 AM. Confirmed correct patient, procedure, site, and patient consented.   Anesthesia Topical anesthesia was used. Anesthetic medications included Lidocaine 2%, Proparacaine 0.5%.   Procedure Preparation included 5% betadine to ocular surface, eyelid speculum. A (32g) needle was used.   Injection: 1.25 mg Bevacizumab 1.25mg /0.2ml   Route: Intravitreal, Site: Left Eye   NDC: [id number], [id number], Expiration date: 03/26/2022   Post-op Post injection exam found visual acuity of at least counting fingers. The patient tolerated the procedure well. There were no complications. The patient received written and verbal post procedure care education. Post injection medications were not given.             ASSESSMENT/PLAN:    ICD-10-CM   1. Branch retinal vein occlusion of left eye with macular edema  H34.8320 OCT, Retina - OU - Both Eyes    Intravitreal Injection, Pharmacologic Agent - OS - Left Eye    Bevacizumab (AVASTIN) SOLN 1.25 mg    2. Essential hypertension  I10     3. Hypertensive retinopathy of both eyes  H35.033     4. Epiretinal membrane (ERM) of left eye  H35.372     5. Pseudophakia of both eyes  Z96.1      1. BRVO w/ CME OS - s/p IVA OS #1 (9.21.22), #2 (10.19.22), #3 (11.16.22), #4 (12.28.22) - pt was first seen on 8.10.22, but could not be treated with intravitreal injection because BP was extremely high R arm (199/122) and L arm (186/110) - pt was referred back to PCP, who has since restarted BP meds - BCVA OS 20/25 (stably improved) - OCT shows stable improvement in superior and temporal IRF/IRHM--BRVO w/ BRAO compenent (inner retinal atrophy and hyperreflectivity) at 8 wks - recommend IVA OS #5 today, 02.22.23 -- maintenance w/ f/u in 8 wks again - Pt in agreement - RBA of procedure discussed, questions answered - informed consent obtained and signed - see procedure note - f/u in 8 weeks, DFE/OCT, possible injection  2,3. Hypertensive retinopathy OU  - BP at presentation (8.10.22) were extremely high -- R arm (199/122) and L arm (186/110)  - pt was referred back to PCP, who has since restarted BP meds - discussed importance of tight BP control  4. Epiretinal membrane, left eye  - The natural history, anatomy, potential for loss of vision, and treatment options including vitrectomy techniques and the complications of endophthalmitis, retinal detachment, vitreous hemorrhage, cataract progression and permanent vision loss discussed with the patient. - mild ERM - BCVA 20/25 - asymptomatic, no metamorphopsia - no indication for surgery at this time - monitor for now - f/u 3 mos -- DFE/OCT  5. Pseudophakia OU  - s/p CE/IOL OU ([place])  - IOLs in good  position, doing well  - post op drops per Dr. Katy Fitch  -  monitor  Ophthalmic Meds Ordered this visit:  Meds ordered this encounter  Medications   Bevacizumab (AVASTIN) SOLN 1.25 mg     Return in about 8 weeks (around 04/18/2022) for f/u BRVO OS, DFE, OCT.  There are no Patient Instructions on file for this visit.  This document serves as a record of services personally performed by Gardiner Sleeper, MD, PhD. It was created on their behalf by Orvan Falconer, an ophthalmic technician. The creation of this record is the provider's dictation and/or activities during the visit.    Electronically signed by: Orvan Falconer, OA, 02/21/22  5:14 PM  This document serves as a record of services personally performed by Gardiner Sleeper, MD, PhD. It was created on their behalf by San Jetty. Owens Shark, OA an ophthalmic technician. The creation of this record is the provider's dictation and/or activities during the visit.    Electronically signed by: San Jetty. Owens Shark, [place] 02.22.2023 5:14 PM  Gardiner Sleeper, M.D., Ph.D. Diseases & Surgery of the Retina and Vitreous [facility]  I have reviewed the above documentation for accuracy and completeness, and I agree with the above. Gardiner Sleeper, M.D., Ph.D. 02/21/22 5:14 PM  Abbreviations: M myopia (nearsighted); A astigmatism; H hyperopia (farsighted); P presbyopia; Mrx spectacle prescription;  CTL contact lenses; OD right eye; OS left eye; OU both eyes  XT exotropia; ET esotropia; PEK punctate epithelial keratitis; PEE punctate epithelial erosions; DES dry eye syndrome; MGD meibomian gland dysfunction; ATs artificial tears; PFAT's preservative free artificial tears; [place] nuclear sclerotic cataract; PSC posterior subcapsular cataract; ERM epi-retinal membrane; PVD posterior vitreous detachment; RD retinal detachment; DM diabetes mellitus; DR diabetic retinopathy; NPDR non-proliferative diabetic retinopathy; PDR proliferative diabetic  retinopathy; CSME clinically significant macular edema; DME diabetic macular edema; dbh dot blot hemorrhages; CWS cotton wool spot; POAG primary open angle glaucoma; C/D cup-to-disc ratio; HVF humphrey visual field; GVF goldmann visual field; OCT optical coherence tomography; IOP intraocular pressure; BRVO Branch retinal vein occlusion; CRVO central retinal vein occlusion; CRAO central retinal artery occlusion; BRAO branch retinal artery occlusion; RT retinal tear; SB scleral buckle; PPV pars plana vitrectomy; VH Vitreous hemorrhage; PRP panretinal laser photocoagulation; IVK intravitreal kenalog; VMT vitreomacular traction; MH Macular hole;  NVD neovascularization of the disc; NVE neovascularization elsewhere; AREDS age related eye disease study; ARMD age related macular degeneration; POAG primary open angle glaucoma; EBMD epithelial/anterior basement membrane dystrophy; ACIOL anterior chamber intraocular lens; IOL intraocular lens; PCIOL posterior chamber intraocular lens; Phaco/IOL phacoemulsification with intraocular lens placement; [place] photorefractive keratectomy; LASIK laser assisted in situ keratomileusis; HTN hypertension; DM diabetes mellitus; COPD chronic obstructive pulmonary disease

## 2022-02-21 ENCOUNTER — Other Ambulatory Visit: Payer: Self-pay

## 2022-02-21 ENCOUNTER — Ambulatory Visit (INDEPENDENT_AMBULATORY_CARE_PROVIDER_SITE_OTHER): Payer: Medicare Other | Admitting: Ophthalmology

## 2022-02-21 ENCOUNTER — Encounter (INDEPENDENT_AMBULATORY_CARE_PROVIDER_SITE_OTHER): Payer: Self-pay | Admitting: Ophthalmology

## 2022-02-21 DIAGNOSIS — H35372 Puckering of macula, left eye: Secondary | ICD-10-CM

## 2022-02-21 DIAGNOSIS — I10 Essential (primary) hypertension: Secondary | ICD-10-CM | POA: Diagnosis not present

## 2022-02-21 DIAGNOSIS — H34832 Tributary (branch) retinal vein occlusion, left eye, with macular edema: Secondary | ICD-10-CM | POA: Diagnosis not present

## 2022-02-21 DIAGNOSIS — H35033 Hypertensive retinopathy, bilateral: Secondary | ICD-10-CM

## 2022-02-21 DIAGNOSIS — Z961 Presence of intraocular lens: Secondary | ICD-10-CM

## 2022-02-21 MED ORDER — BEVACIZUMAB CHEMO INJECTION 1.25MG/0.05ML SYRINGE FOR KALEIDOSCOPE
1.2500 mg | INTRAVITREAL | Status: AC | PRN
  Administered 2022-02-21: 1.25 mg via INTRAVITREAL

## 2022-04-12 NOTE — Progress Notes (Signed)
?[facility] Note ? ?04/18/2022 ? ?  ? ?CHIEF COMPLAINT ?Patient presents for Retina Follow Up ? ?HISTORY OF PRESENT ILLNESS: ?Roger Howe is a 66 y.o. male who presents to the clinic today for:  ? ?HPI   ? ? Retina Follow Up   ?Patient presents with  CRVO/BRVO.  In left eye.  Duration of 8 weeks.  Since onset it is stable.  I, the attending physician,  performed the HPI with the patient and updated documentation appropriately. ? ?  ?  ? ? Comments   ?8 week follow up BRVO OS-  No new problems or change in vision since last visit.  ? ?  ?  ?Last edited by Bernarda Caffey, MD on 04/18/2022  3:17 PM.  ?  ? ?Referring physician: ?Sandi Mariscal, MD ?[address] ?[place],  [place] [id number] ? ?HISTORICAL INFORMATION:  ?Selected notes from the [facility] ?Referred by Dr. Katy Fitch for eval of BRVO OS  ? ?CURRENT MEDICATIONS: ?No current outpatient medications on file. (Ophthalmic Drugs)  ? ?No current facility-administered medications for this visit. (Ophthalmic Drugs)  ? ?Current Outpatient Medications (Other)  ?Medication Sig  ? aspirin 81 MG chewable tablet Chew 81 mg by mouth daily.  ? atorvastatin (LIPITOR) 20 MG tablet Take 20 mg by mouth daily at 6 PM.  ? fenofibrate micronized (LOFIBRA) 134 MG capsule Take 134 mg by mouth daily.  ? lisinopril-hydrochlorothiazide (ZESTORETIC) 10-12.5 MG tablet Take 1 tablet by mouth daily.  ? carvedilol (COREG) 3.125 MG tablet TAKE 1 TABLET BY MOUTH TWICE A DAY *PLS CALL OFFICE FOR ADDITIONAL REFILLS* (Patient not taking: Reported on 11/15/2021)  ? metoprolol succinate (TOPROL-XL) 25 MG 24 hr tablet Take 25 mg by mouth daily. (Patient not taking: Reported on 11/15/2021)  ? ?No current facility-administered medications for this visit. (Other)  ? ?REVIEW OF SYSTEMS: ?ROS   ?Positive for: Cardiovascular, Eyes ?Negative for: Constitutional, Gastrointestinal, Neurological, Skin, Genitourinary, Musculoskeletal, HENT, Endocrine, Respiratory, Psychiatric,  Allergic/Imm, Heme/Lymph ?Last edited by Leonie Douglas, [place] on 04/18/2022  9:10 AM.  ?  ? ?ALLERGIES ?No Known Allergies ? ?PAST MEDICAL HISTORY ?Past Medical History:  ?Diagnosis Date  ? Abdominal aortic atherosclerosis ([place])   ? Seen on catheterization  ? CAD, multiple vessel 05/18/2014  ? Coronary artery disease   ? Essential hypertension   ? Hypertensive retinopathy   ? NSTEMI (non-ST elevated myocardial infarction) ([place]) 05/17/2014  ? Post CABG Echo 09/19/2014: Normal EF 55-60% with mild Conc LVH. No regional WMA, essentially normal  ? 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 LIMA to LAD, SVG to D1, Sequential SVG to PDA and RPL2, EVH via right thigh and leg  ? ?Past Surgical History:  ?Procedure Laterality Date  ? CARDIAC CATHETERIZATION  05/17/2014  ? DR HARDING: 95% proximal LAD, 90% ramus intermedius, 95% mid circumflex after OM1, neither percent RPDA  ? CORONARY ARTERY BYPASS GRAFT N/A 05/20/2014  ? Procedure: CORONARY ARTERY BYPASS GRAFTING (CABG) x 4,  LIMA-MID LAD,  SVG-DIAGONAL,  SEQUENTIAL SVG -PDA, PLB #2;  Surgeon: Rexene Alberts, MD;  Location: [facility];  Service: Open Heart Surgery;  Laterality: N/A;  ? INTRAOPERATIVE TRANSESOPHAGEAL ECHOCARDIOGRAM N/A 05/20/2014  ? Procedure: INTRAOPERATIVE TRANSESOPHAGEAL ECHOCARDIOGRAM;  Surgeon: Rexene Alberts, MD;  Location: [facility];  Service: Open Heart Surgery;  Laterality: N/A;  ? LEFT HEART CATHETERIZATION WITH CORONARY ANGIOGRAM N/A 05/18/2014  ? Procedure: LEFT HEART CATHETERIZATION WITH CORONARY ANGIOGRAM;  Surgeon: Leonie Man, MD;  Location: [facility]  CATH LAB;  Service: Cardiovascular;  Laterality: N/A;  ? TRANSTHORACIC ECHOCARDIOGRAM  09/14/2014  ? Normal EF 55-60% with mild Conc LVH. No regional WMA, essentially normal  ? ?FAMILY HISTORY ?Family History  ?Problem Relation Age of Onset  ? Heart disease Brother   ?     lives in [place]  ? Heart attack Neg Hx   ? Stroke Neg Hx   ? ?SOCIAL HISTORY ?Social History  ? ?Tobacco Use  ? Smoking status: Never  ? Smokeless tobacco: Never   ?Substance Use Topics  ? Alcohol use: Never  ? Drug use: Never  ?  ? ?  ?OPHTHALMIC EXAM: ?Base Eye Exam   ? ? Visual Acuity (Snellen - Linear)   ? ?   Right Left  ? Dist cc 20/30 20/25  ? Dist ph cc NI NI  ? ? Correction: Glasses  ? ?  ?  ? ? Tonometry (Tonopen, 9:16 AM)   ? ?   Right Left  ? Pressure 13 13  ? ?  ?  ? ? Pupils   ? ?   Dark Light Shape React APD  ? Right 4 3 Round Brisk None  ? Left 4 3 Round Brisk None  ? ?  ?  ? ? Visual Fields (Counting fingers)   ? ?   Left Right  ?  Full Full  ? ?  ?  ? ? Extraocular Movement   ? ?   Right Left  ?  Full Full  ? ?  ?  ? ? Neuro/Psych   ? ? Oriented x3: Yes  ? Mood/Affect: Normal  ? ?  ?  ? ? Dilation   ? ? Both eyes: 1.0% Mydriacyl, 2.5% Phenylephrine @ 9:16 AM  ? ?  ?  ? ?  ? ?Slit Lamp and Fundus Exam   ? ? Slit Lamp Exam   ? ?   Right Left  ? Lids/Lashes Dermatochalasis - upper lid Dermatochalasis - upper lid  ? Conjunctiva/Sclera White and quiet Mild melanosis  ? Cornea Arcus, Well healed cataract wound Arcus, Well healed cataract wound  ? Anterior Chamber Deep and quiet Deep and quiet  ? Iris Round and dilated round and poorly dilated  ? Lens PCIOL in good position PCIOL in good position  ? Anterior Vitreous Vitreous syneresis, condensations Vitreous syneresis  ? ?  ?  ? ? Fundus Exam   ? ?   Right Left  ? Disc Pink and sharp, mild PPA Pink and sharp, mild temporal Pallor  ? C/D Ratio 0.3 0.4  ? Macula Flat, Good foveal reflex, RPE mottling, no heme or edema Good foveal reflex, mild interval increase in cystic changes, IRH/CWS superior macula -- stable, +ERM SN macula  ? Vessels Vascular attenuation, Tortuous, Copper wiring & AV crossing changes slightly improved Vascular attenuation, Tortuous, flame hemes along ST arcades improving--BRVO/BRAO  ? Periphery Attached, no heme Attached, IRH superior midzone (extension of ST BRVO)  ? ?  ?  ? ?  ? ?IMAGING AND PROCEDURES  ?Imaging and Procedures for 04/18/2022 ? ?OCT, Retina - OU - Both Eyes   ? ?   ?Right  Eye ?Quality was good. Central Foveal Thickness: 265. Progression has been stable. Findings include normal foveal contour, no IRF, no SRF, retinal drusen .  ? ?Left Eye ?Quality was borderline. Central Foveal Thickness: 342. Progression has worsened. Findings include abnormal foveal contour, no SRF, intraretinal hyper-reflective material, inner retinal atrophy, no IRF, epiretinal membrane (Mild interval increase  insuperior and temporal IRF/IRA/IRHM--BRVO w/BRAO component, mild interval progression of ERM with interval blunting of foveal contour, partial PVD).  ? ?Notes ?*Images captured and stored on drive ? ?Diagnosis / Impression:  ?OD: NFP, no IRF/SRF ?OS: Mild interval increase insuperior and temporal IRF/IRA/IRHM--BRVO w/BRAO component, mild interval progression of ERM with interval blunting of foveal contour, partial PVD ? ?Clinical management:  ?See below ? ?Abbreviations: NFP - Normal foveal profile. CME - cystoid macular edema. PED - pigment epithelial detachment. IRF - intraretinal fluid. SRF - subretinal fluid. EZ - ellipsoid zone. ERM - epiretinal membrane. ORA - outer retinal atrophy. ORT - outer retinal tubulation. SRHM - subretinal hyper-reflective material. IRHM - intraretinal hyper-reflective material ? ? ?  ? ?Intravitreal Injection, Pharmacologic Agent - OS - Left Eye   ? ?   ?Time Out ?04/18/2022. 9:46 AM. Confirmed correct patient, procedure, site, and patient consented.  ? ?Anesthesia ?Topical anesthesia was used. Anesthetic medications included Lidocaine 2%, Proparacaine 0.5%.  ? ?Procedure ?Preparation included 5% betadine to ocular surface, eyelid speculum. A (32g) needle was used.  ? ?Injection: ?1.25 mg Bevacizumab 1.25mg /0.47ml ?  Route: Intravitreal, Site: Left Eye ?  [place]: [id number], [id number], Expiration date: 06/25/2022  ? ?Post-op ?Post injection exam found visual acuity of at least counting fingers. The patient tolerated the procedure well. There were no complications. The  patient received written and verbal post procedure care education. Post injection medications were not given.  ? ?  ?  ?  ? ?  ?ASSESSMENT/PLAN: ? ?  ICD-10-CM   ?1. Branch retinal vein occlusion of left eye with ma

## 2022-04-18 ENCOUNTER — Ambulatory Visit (INDEPENDENT_AMBULATORY_CARE_PROVIDER_SITE_OTHER): Payer: Medicare Other | Admitting: Ophthalmology

## 2022-04-18 ENCOUNTER — Encounter (INDEPENDENT_AMBULATORY_CARE_PROVIDER_SITE_OTHER): Payer: Self-pay | Admitting: Ophthalmology

## 2022-04-18 DIAGNOSIS — I10 Essential (primary) hypertension: Secondary | ICD-10-CM

## 2022-04-18 DIAGNOSIS — H35372 Puckering of macula, left eye: Secondary | ICD-10-CM | POA: Diagnosis not present

## 2022-04-18 DIAGNOSIS — H35033 Hypertensive retinopathy, bilateral: Secondary | ICD-10-CM | POA: Diagnosis not present

## 2022-04-18 DIAGNOSIS — Z961 Presence of intraocular lens: Secondary | ICD-10-CM

## 2022-04-18 DIAGNOSIS — H34832 Tributary (branch) retinal vein occlusion, left eye, with macular edema: Secondary | ICD-10-CM | POA: Diagnosis not present

## 2022-04-18 MED ORDER — BEVACIZUMAB CHEMO INJECTION 1.25MG/0.05ML SYRINGE FOR KALEIDOSCOPE
1.2500 mg | INTRAVITREAL | Status: AC | PRN
  Administered 2022-04-18: 1.25 mg via INTRAVITREAL

## 2022-05-31 NOTE — Progress Notes (Signed)
[facility] - Clinic Note  06/06/2022     CHIEF COMPLAINT Patient presents for Retina Follow Up  HISTORY OF PRESENT ILLNESS: Roger Howe is a 66 y.o. male who presents to the clinic today for:   HPI     Retina Follow Up   Patient presents with  CRVO/BRVO.  In left eye.  Duration of 7 weeks.  Since onset it is gradually improving.  I, the attending physician,  performed the HPI with the patient and updated documentation appropriately.        Comments   7 week follow up BRVO OS- Patient states vision is a little better.       Last edited by [name], MD on 06/06/2022 12:31 PM.     Referring physician: Salli Real, MD [address] [place],  [place] [address]  HISTORICAL INFORMATION:  Selected notes from the MEDICAL RECORD NUMBER Referred by Dr. [name] for eval of BRVO OS   CURRENT MEDICATIONS: No current outpatient medications on file. (Ophthalmic Drugs)   No current facility-administered medications for this visit. (Ophthalmic Drugs)   Current Outpatient Medications (Other)  Medication Sig   aspirin 81 MG chewable tablet Chew 81 mg by mouth daily.   atorvastatin (LIPITOR) 20 MG tablet Take 20 mg by mouth daily at 6 PM.   fenofibrate micronized (LOFIBRA) 134 MG capsule Take 134 mg by mouth daily.   lisinopril-hydrochlorothiazide (ZESTORETIC) 10-12.5 MG tablet Take 1 tablet by mouth daily.   carvedilol (COREG) 3.125 MG tablet TAKE 1 TABLET BY MOUTH TWICE A DAY *PLS CALL OFFICE FOR ADDITIONAL REFILLS* (Patient not taking: Reported on 11/15/2021)   metoprolol succinate (TOPROL-XL) 25 MG 24 hr tablet Take 25 mg by mouth daily. (Patient not taking: Reported on 11/15/2021)   No current facility-administered medications for this visit. (Other)   REVIEW OF SYSTEMS: ROS   Positive for: Cardiovascular, Eyes Negative for: Constitutional, Gastrointestinal, Neurological, Skin, Genitourinary, Musculoskeletal, HENT, Endocrine, Respiratory, Psychiatric,  Allergic/Imm, Heme/Lymph Last edited by Joni Reining, COA on 06/06/2022  9:08 AM.     ALLERGIES No Known Allergies  PAST MEDICAL HISTORY Past Medical History:  Diagnosis Date   Abdominal aortic atherosclerosis (HCC)    Seen on catheterization   CAD, multiple vessel 05/18/2014   Coronary artery disease    Essential hypertension    Hypertensive retinopathy    NSTEMI (non-ST elevated myocardial infarction) (HCC) 05/17/2014   Post CABG Echo 09/19/2014: Normal EF 55-60% with mild Conc LVH. 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   LIMA to LAD, SVG to D1, Sequential SVG to PDA and RPL2, EVH via right thigh and leg   Past Surgical History:  Procedure Laterality Date   CARDIAC CATHETERIZATION  05/17/2014   DR HARDING: 95% proximal LAD, 90% ramus intermedius, 95% mid circumflex after OM1, neither percent RPDA   CORONARY ARTERY BYPASS GRAFT N/A 05/20/2014   Procedure: CORONARY ARTERY BYPASS GRAFTING (CABG) x 4,  LIMA-MID LAD,  SVG-DIAGONAL,  SEQUENTIAL SVG -PDA, PLB #2;  Surgeon: [name], MD;  Location: [facility] OR;  Service: Open Heart Surgery;  Laterality: N/A;   INTRAOPERATIVE TRANSESOPHAGEAL ECHOCARDIOGRAM N/A 05/20/2014   Procedure: INTRAOPERATIVE TRANSESOPHAGEAL ECHOCARDIOGRAM;  Surgeon: [name], MD;  Location: [facility] OR;  Service: Open Heart Surgery;  Laterality: N/A;   LEFT HEART CATHETERIZATION WITH CORONARY ANGIOGRAM N/A 05/18/2014   Procedure: LEFT HEART CATHETERIZATION WITH CORONARY ANGIOGRAM;  Surgeon: [name], MD;  Location: [facility] CATH LAB;  Service:  Cardiovascular;  Laterality: N/A;   TRANSTHORACIC ECHOCARDIOGRAM  09/14/2014   Normal EF 55-60% with mild Conc LVH. No regional WMA, essentially normal   FAMILY HISTORY Family History  Problem Relation Age of Onset   Heart disease Brother        lives in [place]   Heart attack Neg Hx    Stroke Neg Hx    SOCIAL HISTORY Social History   Tobacco Use   Smoking status: Never   Smokeless tobacco: Never   Substance Use Topics   Alcohol use: Never   Drug use: Never       OPHTHALMIC EXAM: Base Eye Exam     Visual Acuity (Snellen - Linear)       Right Left   Dist cc 20/30 20/25   Dist ph cc NI NI    Correction: Glasses         Tonometry (Tonopen, 9:13 AM)       Right Left   Pressure 11 13         Pupils       Dark Light Shape React APD   Right 4 3 Round Brisk None   Left 4 3 Round Brisk None         Visual Fields (Counting fingers)       Left Right    Full Full         Extraocular Movement       Right Left    Full Full         Neuro/Psych     Oriented x3: Yes   Mood/Affect: Normal         Dilation     Both eyes: 1.0% Mydriacyl, 2.5% Phenylephrine @ 9:14 AM           Slit Lamp and Fundus Exam     Slit Lamp Exam       Right Left   Lids/Lashes Dermatochalasis - upper lid Dermatochalasis - upper lid   Conjunctiva/Sclera White and quiet Mild melanosis   Cornea Arcus, Well healed cataract wound Arcus, Well healed cataract wound   Anterior Chamber Deep and quiet Deep and quiet   Iris Round and dilated round and poorly dilated   Lens PCIOL in good position PCIOL in good position   Anterior Vitreous Vitreous syneresis, condensations Vitreous syneresis         Fundus Exam       Right Left   Disc Pink and sharp, mild PPA Pink and sharp, mild temporal Pallor   C/D Ratio 0.3 0.4   Macula Flat, Good foveal reflex, RPE mottling, no heme or edema Good foveal reflex, persistent cystic changes, IRH/CWS superior and temporal macula -- improved, +ERM SN macula   Vessels Vascular attenuation, Coppeattenuated, Tortuous Vascular attenuation, Tortuous, flame hemes along ST arcades improving--BRVO/BRAO   Periphery Attached, no heme Attached, IRH superior midzone (extension of ST BRVO)           IMAGING AND PROCEDURES  Imaging and Procedures for 06/06/2022  OCT, Retina - OU - Both Eyes       Right Eye Quality was good. Central Foveal  Thickness: 268. Progression has been stable. Findings include normal foveal contour, no IRF, no SRF, retinal drusen .   Left Eye Quality was borderline. Central Foveal Thickness: 340. Progression has worsened. Findings include abnormal foveal contour, no SRF, intraretinal hyper-reflective material, inner retinal atrophy, no IRF, epiretinal membrane (Persistent insuperior and temporal IRF/IRA/IRHM--BRVO w/BRAO component, persistent ERM with blunting of foveal contour, partial  PVD).   Notes *Images captured and stored on drive  Diagnosis / Impression:  OD: NFP, no IRF/SRF OS: Persistent superior and temporal IRF/IRA/IRHM--BRVO w/ BRAO component; persistent ERM with blunting of foveal contour, partial PVD  Clinical management:  See below  Abbreviations: NFP - Normal foveal profile. CME - cystoid macular edema. PED - pigment epithelial detachment. IRF - intraretinal fluid. SRF - subretinal fluid. EZ - ellipsoid zone. ERM - epiretinal membrane. ORA - outer retinal atrophy. ORT - outer retinal tubulation. SRHM - subretinal hyper-reflective material. IRHM - intraretinal hyper-reflective material      Intravitreal Injection, Pharmacologic Agent - OS - Left Eye       Time Out 06/06/2022. 9:44 AM. Confirmed correct patient, procedure, site, and patient consented.   Anesthesia Topical anesthesia was used. Anesthetic medications included Lidocaine 2%, Proparacaine 0.5%.   Procedure Preparation included 5% betadine to ocular surface, eyelid speculum. A (32g) needle was used.   Injection: 1.25 mg Bevacizumab 1.25mg /0.20ml   Route: Intravitreal, Site: Left Eye   NDC: [id number], Lot: [id number] , Expiration date: 07/11/2022   Post-op Post injection exam found visual acuity of at least counting fingers. The patient tolerated the procedure well. There were no complications. The patient received written and verbal post procedure care education. Post injection medications were not given.             ASSESSMENT/PLAN:    ICD-10-CM   1. Branch retinal vein occlusion of left eye with macular edema  H34.8320 OCT, Retina - OU - Both Eyes    Intravitreal Injection, Pharmacologic Agent - OS - Left Eye    Bevacizumab (AVASTIN) SOLN 1.25 mg    2. Essential hypertension  I10     3. Hypertensive retinopathy of both eyes  H35.033     4. Epiretinal membrane (ERM) of left eye  H35.372     5. Pseudophakia of both eyes  Z96.1      1. BRVO w/ CME OS - s/p IVA OS #1 (9.21.22), #2 (10.19.22), #3 (11.16.22), #4 (12.28.22), #5 (02.22.23) - pt was first seen on 8.10.22, but could not be treated with intravitreal injection because BP was extremely high R arm (199/122) and L arm (186/110) - pt was referred back to PCP, who has since restarted BP meds - BCVA OS 20/25 (stably improved) - OCT shows Persistent superior and temporal IRF/IRA/IRHM--BRVO w/ BRAO component, persistent ERM with blunting of foveal contour, partial PVD at 7 weeks **history of increase in IRF at 8 weeks, noted on 04.19.23 OCT** - recommend IVA OS #6 today, 06.7.23 -- w/ f/u in 7 wks - Pt in agreement - RBA of procedure discussed, questions answered - informed consent obtained and re-signed, 06.07.23 - see procedure note - f/u in 7 weeks, DFE/OCT, possible injection  2,3. Hypertensive retinopathy OU  - BP at presentation (8.10.22) were extremely high -- R arm (199/122) and L arm (186/110)  - pt was referred back to PCP, who has since restarted BP meds - discussed importance of tight BP control.  4. Epiretinal membrane, left eye  - mild ERM - BCVA 20/25 - asymptomatic, no metamorphopsia - no indication for surgery at this time - monitor for now - f/u 3 mos -- DFE/OCT  5. Pseudophakia OU  - s/p CE/IOL OU (S. Groat)  - IOLs in good position, doing well  - post op drops per Dr. [name]  - monitor  Ophthalmic Meds Ordered this visit:  Meds ordered this encounter  Medications   Bevacizumab (AVASTIN) SOLN  1.25 mg      Return in about 7 weeks (around 07/25/2022) for f/u BRVO OS, DFE, OCT.  There are no Patient Instructions on file for this visit.  This document serves as a record of services personally performed by Karie Chimera, MD, PhD. It was created on their behalf by De Blanch, an ophthalmic technician. The creation of this record is the provider's dictation and/or activities during the visit.    Electronically signed by: De Blanch, OA, 06/06/22  12:34 PM  This document serves as a record of services personally performed by Karie Chimera, MD, PhD. It was created on their behalf by Glee Arvin. Manson Passey, OA an ophthalmic technician. The creation of this record is the provider's dictation and/or activities during the visit.    Electronically signed by: Glee Arvin. Kristopher Oppenheim 06.07.2023 12:34 PM  Karie Chimera, M.D., Ph.D. Diseases & Surgery of the Retina and Vitreous [facility]  I have reviewed the above documentation for accuracy and completeness, and I agree with the above. Karie Chimera, M.D., Ph.D. 06/06/22 12:34 PM   Abbreviations: M myopia (nearsighted); A astigmatism; H hyperopia (farsighted); P presbyopia; Mrx spectacle prescription;  CTL contact lenses; OD right eye; OS left eye; OU both eyes  XT exotropia; ET esotropia; PEK punctate epithelial keratitis; PEE punctate epithelial erosions; DES dry eye syndrome; MGD meibomian gland dysfunction; ATs artificial tears; PFAT's preservative free artificial tears; NSC nuclear sclerotic cataract; PSC posterior subcapsular cataract; ERM epi-retinal membrane; PVD posterior vitreous detachment; RD retinal detachment; DM diabetes mellitus; DR diabetic retinopathy; NPDR non-proliferative diabetic retinopathy; PDR proliferative diabetic retinopathy; CSME clinically significant macular edema; DME diabetic macular edema; dbh dot blot hemorrhages; CWS cotton wool spot; POAG primary open angle glaucoma; C/D cup-to-disc ratio;  HVF humphrey visual field; GVF goldmann visual field; OCT optical coherence tomography; IOP intraocular pressure; BRVO Branch retinal vein occlusion; CRVO central retinal vein occlusion; CRAO central retinal artery occlusion; BRAO branch retinal artery occlusion; RT retinal tear; SB scleral buckle; PPV pars plana vitrectomy; VH Vitreous hemorrhage; PRP panretinal laser photocoagulation; IVK intravitreal kenalog; VMT vitreomacular traction; MH Macular hole;  NVD neovascularization of the disc; NVE neovascularization elsewhere; AREDS age related eye disease study; ARMD age related macular degeneration; POAG primary open angle glaucoma; EBMD epithelial/anterior basement membrane dystrophy; ACIOL anterior chamber intraocular lens; IOL intraocular lens; PCIOL posterior chamber intraocular lens; Phaco/IOL phacoemulsification with intraocular lens placement; PRK photorefractive keratectomy; LASIK laser assisted in situ keratomileusis; HTN hypertension; DM diabetes mellitus; COPD chronic obstructive pulmonary disease

## 2022-06-06 ENCOUNTER — Encounter (INDEPENDENT_AMBULATORY_CARE_PROVIDER_SITE_OTHER): Payer: Self-pay | Admitting: Ophthalmology

## 2022-06-06 ENCOUNTER — Ambulatory Visit (INDEPENDENT_AMBULATORY_CARE_PROVIDER_SITE_OTHER): Payer: Medicare Other | Admitting: Ophthalmology

## 2022-06-06 DIAGNOSIS — H34832 Tributary (branch) retinal vein occlusion, left eye, with macular edema: Secondary | ICD-10-CM | POA: Diagnosis not present

## 2022-06-06 DIAGNOSIS — H35372 Puckering of macula, left eye: Secondary | ICD-10-CM | POA: Diagnosis not present

## 2022-06-06 DIAGNOSIS — Z961 Presence of intraocular lens: Secondary | ICD-10-CM

## 2022-06-06 DIAGNOSIS — H35033 Hypertensive retinopathy, bilateral: Secondary | ICD-10-CM | POA: Diagnosis not present

## 2022-06-06 DIAGNOSIS — I10 Essential (primary) hypertension: Secondary | ICD-10-CM

## 2022-06-06 MED ORDER — BEVACIZUMAB CHEMO INJECTION 1.25MG/0.05ML SYRINGE FOR KALEIDOSCOPE
1.2500 mg | INTRAVITREAL | Status: AC | PRN
  Administered 2022-06-06: 1.25 mg via INTRAVITREAL

## 2022-07-25 ENCOUNTER — Encounter (INDEPENDENT_AMBULATORY_CARE_PROVIDER_SITE_OTHER): Payer: Medicare Other | Admitting: Ophthalmology

## 2022-07-25 ENCOUNTER — Ambulatory Visit (INDEPENDENT_AMBULATORY_CARE_PROVIDER_SITE_OTHER): Payer: Medicare Other | Admitting: Ophthalmology

## 2022-07-25 ENCOUNTER — Encounter (INDEPENDENT_AMBULATORY_CARE_PROVIDER_SITE_OTHER): Payer: Self-pay | Admitting: Ophthalmology

## 2022-07-25 DIAGNOSIS — H35033 Hypertensive retinopathy, bilateral: Secondary | ICD-10-CM

## 2022-07-25 DIAGNOSIS — H34832 Tributary (branch) retinal vein occlusion, left eye, with macular edema: Secondary | ICD-10-CM

## 2022-07-25 DIAGNOSIS — I10 Essential (primary) hypertension: Secondary | ICD-10-CM | POA: Diagnosis not present

## 2022-07-25 DIAGNOSIS — H35372 Puckering of macula, left eye: Secondary | ICD-10-CM | POA: Diagnosis not present

## 2022-07-25 DIAGNOSIS — Z961 Presence of intraocular lens: Secondary | ICD-10-CM

## 2022-07-25 MED ORDER — BEVACIZUMAB CHEMO INJECTION 1.25MG/0.05ML SYRINGE FOR KALEIDOSCOPE
1.2500 mg | INTRAVITREAL | Status: AC | PRN
  Administered 2022-07-25: 1.25 mg via INTRAVITREAL

## 2022-07-25 NOTE — Progress Notes (Signed)
[facility] Note  07/25/2022    CHIEF COMPLAINT Patient presents for Retina Follow Up  HISTORY OF PRESENT ILLNESS: Roger Howe is a 66 y.o. male who presents to the clinic today for:   HPI     Retina Follow Up   Patient presents with  CRVO/BRVO.  In left eye.  Severity is moderate.  Duration of 7 weeks.  Since onset it is stable.  I, the attending physician,  performed the HPI with the patient and updated documentation appropriately.        Comments   Pt here for 7wk ret f/u BRVO OS. Pt states VA seems the same but is having issues with watering and dryness OU. Does not report taking any OTC drops.       Last edited by Bernarda Caffey, MD on 07/25/2022  9:22 AM.    Patient feels that the vision has slightly improved.   Referring physician: Sandi Mariscal, MD [place],  [place] [address]  HISTORICAL INFORMATION:  Selected notes from the MEDICAL RECORD NUMBER Referred by Dr. Katy Fitch for eval of BRVO OS   CURRENT MEDICATIONS: No current outpatient medications on file. (Ophthalmic Drugs)   No current facility-administered medications for this visit. (Ophthalmic Drugs)   Current Outpatient Medications (Other)  Medication Sig   aspirin 81 MG chewable tablet Chew 81 mg by mouth daily.   atorvastatin (LIPITOR) 20 MG tablet Take 20 mg by mouth daily at 6 PM.   fenofibrate micronized (LOFIBRA) 134 MG capsule Take 134 mg by mouth daily.   lisinopril-hydrochlorothiazide (ZESTORETIC) 10-12.5 MG tablet Take 1 tablet by mouth daily.   carvedilol (COREG) 3.125 MG tablet TAKE 1 TABLET BY MOUTH TWICE A DAY *PLS CALL OFFICE FOR ADDITIONAL REFILLS* (Patient not taking: Reported on 11/15/2021)   metoprolol succinate (TOPROL-XL) 25 MG 24 hr tablet Take 25 mg by mouth daily. (Patient not taking: Reported on 11/15/2021)   No current facility-administered medications for this visit. (Other)   REVIEW OF SYSTEMS: ROS   Positive for: Cardiovascular,  Eyes Negative for: Constitutional, Gastrointestinal, Neurological, Skin, Genitourinary, Musculoskeletal, HENT, Endocrine, Respiratory, Psychiatric, Allergic/Imm, Heme/Lymph Last edited by Kingsley Spittle, COT on 07/25/2022  8:40 AM.     ALLERGIES No Known Allergies  PAST MEDICAL HISTORY Past Medical History:  Diagnosis Date   Abdominal aortic atherosclerosis ([place])    Seen on catheterization   CAD, multiple vessel 05/18/2014   Coronary artery disease    Essential hypertension    Hypertensive retinopathy    NSTEMI (non-ST elevated myocardial infarction) ([place]) 05/17/2014   Post CABG Echo 09/19/2014: Normal EF 55-60% with mild Conc LVH. 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   LIMA to LAD, SVG to D1, Sequential SVG to PDA and RPL2, EVH via right thigh and leg   Past Surgical History:  Procedure Laterality Date   CARDIAC CATHETERIZATION  05/17/2014   DR HARDING: 95% proximal LAD, 90% ramus intermedius, 95% mid circumflex after OM1, neither percent RPDA   CORONARY ARTERY BYPASS GRAFT N/A 05/20/2014   Procedure: CORONARY ARTERY BYPASS GRAFTING (CABG) x 4,  LIMA-MID LAD,  SVG-DIAGONAL,  SEQUENTIAL SVG -PDA, PLB #2;  Surgeon: Rexene Alberts, MD;  Location: [facility];  Service: Open Heart Surgery;  Laterality: N/A;   INTRAOPERATIVE TRANSESOPHAGEAL ECHOCARDIOGRAM N/A 05/20/2014   Procedure: INTRAOPERATIVE TRANSESOPHAGEAL ECHOCARDIOGRAM;  Surgeon: Rexene Alberts, MD;  Location: [facility];  Service: Open Heart Surgery;  Laterality: N/A;  LEFT HEART CATHETERIZATION WITH CORONARY ANGIOGRAM N/A 05/18/2014   Procedure: LEFT HEART CATHETERIZATION WITH CORONARY ANGIOGRAM;  Surgeon: Leonie Man, MD;  Location: [facility] CATH LAB;  Service: Cardiovascular;  Laterality: N/A;   TRANSTHORACIC ECHOCARDIOGRAM  09/14/2014   Normal EF 55-60% with mild Conc LVH. No regional WMA, essentially normal   FAMILY HISTORY Family History  Problem Relation Age of Onset   Heart disease Brother         lives in [place]   Heart attack Neg Hx    Stroke Neg Hx    SOCIAL HISTORY Social History   Tobacco Use   Smoking status: Never   Smokeless tobacco: Never  Substance Use Topics   Alcohol use: Never   Drug use: Never       OPHTHALMIC EXAM: Base Eye Exam     Visual Acuity (Snellen - Linear)       Right Left   Dist [place] 20/30 20/30 -2   Dist ph [place] 20/25 -2 20/25         Tonometry (Tonopen, 8:49 AM)       Right Left   Pressure 11 14         Pupils       Dark Light Shape React APD   Right 4 3 Round Brisk None   Left 4 3 Round Brisk None         Visual Fields (Counting fingers)       Left Right    Full Full         Extraocular Movement       Right Left    Full, Ortho Full, Ortho         Neuro/Psych     Oriented x3: Yes   Mood/Affect: Normal         Dilation     Both eyes: 1.0% Mydriacyl, 2.5% Phenylephrine @ 8:49 AM           Slit Lamp and Fundus Exam     Slit Lamp Exam       Right Left   Lids/Lashes Dermatochalasis - upper lid Dermatochalasis - upper lid   Conjunctiva/Sclera White and quiet Mild melanosis   Cornea Arcus, Well healed cataract wound Arcus, Well healed cataract wound   Anterior Chamber Deep and quiet Deep and quiet   Iris Round and dilated round and poorly dilated   Lens PCIOL in good position PCIOL in good position   Anterior Vitreous Vitreous syneresis, condensations Vitreous syneresis         Fundus Exam       Right Left   Disc Pink and sharp, mild PPA Pink and sharp, mild temporal Pallor   C/D Ratio 0.3 0.4   Macula Flat, Good foveal reflex, RPE mottling, no heme or edema Good foveal reflex, persistent cystic changes, IRH/CWS superior and temporal macula -- improved, +ERM SN macula   Vessels Vascular attenuation, Copper wiring, attenuated, Tortuous Vascular attenuation, Tortuous, flame hemes along ST arcades resolved--BRVO/BRAO   Periphery Attached, no heme Attached, IRH superior midzone (extension of ST BRVO)--  improved           IMAGING AND PROCEDURES  Imaging and Procedures for 07/25/2022  OCT, Retina - OU - Both Eyes       Right Eye Quality was good. Central Foveal Thickness: 270. Progression has been stable. Findings include normal foveal contour, no IRF, no SRF, retinal drusen .   Left Eye Quality was good. Central Foveal Thickness: 343. Progression has improved.  Findings include no IRF, no SRF, abnormal foveal contour, intraretinal hyper-reflective material, epiretinal membrane, inner retinal atrophy (Mild interval improvement insuperior and temporal IRF/IRA/IRHM--BRVO w/BRAO component, persistent ERM with blunting of foveal contour, partial PVD).   Notes *Images captured and stored on drive  Diagnosis / Impression:  OD: NFP, no IRF/SRF OS: Mild interval improvement in superior and temporal IRF/IRA/IRHM--BRVO w/BRAO component, persistent ERM with blunting of foveal contour, partial PVD  Clinical management:  See below  Abbreviations: NFP - Normal foveal profile. CME - cystoid macular edema. PED - pigment epithelial detachment. IRF - intraretinal fluid. SRF - subretinal fluid. EZ - ellipsoid zone. ERM - epiretinal membrane. ORA - outer retinal atrophy. ORT - outer retinal tubulation. SRHM - subretinal hyper-reflective material. IRHM - intraretinal hyper-reflective material      Intravitreal Injection, Pharmacologic Agent - OS - Left Eye       Time Out 07/25/2022. 9:05 AM. Confirmed correct patient, procedure, site, and patient consented.   Anesthesia Topical anesthesia was used. Anesthetic medications included Lidocaine 2%, Proparacaine 0.5%.   Procedure Preparation included 5% betadine to ocular surface, eyelid speculum. A (32g) needle was used.   Injection: 1.25 mg Bevacizumab 1.45m/0.05ml   Route: Intravitreal, Site: Left Eye   NDC: [id number] Lot:: [id number] Expiration date: 09/18/2022   Post-op Post injection exam found visual acuity of at least counting fingers.  The patient tolerated the procedure well. There were no complications. The patient received written and verbal post procedure care education. Post injection medications were not given.            ASSESSMENT/PLAN:    ICD-10-CM   1. Branch retinal vein occlusion of left eye with macular edema  H34.8320 OCT, Retina - OU - Both Eyes    Intravitreal Injection, Pharmacologic Agent - OS - Left Eye    Bevacizumab (AVASTIN) SOLN 1.25 mg    2. Essential hypertension  I10     3. Hypertensive retinopathy of both eyes  H35.033     4. Epiretinal membrane (ERM) of left eye  H35.372     5. Pseudophakia of both eyes  Z96.1      1. BRVO w/ CME OS - s/p IVA OS #1 (9.21.22), #2 (10.19.22), #3 (11.16.22), #4 (12.28.22), #5 (02.22.23), #6 (06.07.23) - pt was first seen on 8.10.22, but could not be treated with intravitreal injection because BP was extremely high R arm (199/122) and L arm (186/110) - pt was referred back to PCP, who has since restarted BP meds - BCVA OS 20/25 (stably improved) - OCT shows Persistent superior and temporal IRF/IRA/IRHM--BRVO w/ BRAO component, persistent ERM with blunting of foveal contour, partial PVD at 7 weeks **history of increase in IRF at 8 weeks, noted on 04.19.23 OCT** - recommend IVA OS #7 today, 07.26.23 -- w/ f/u in 7 wks - Pt in agreement - RBA of procedure discussed, questions answered - informed consent obtained and re-signed, 06.07.23 - see procedure note - f/u in 7 weeks, DFE/OCT, possible injection  2,3. Hypertensive retinopathy OU  - BP at presentation (8.10.22) were extremely high -- R arm (199/122) and L arm (186/110)  - pt was referred back to PCP, who has since restarted BP meds - discussed importance of tight BP control.  4. Epiretinal membrane, left eye  - mild ERM - BCVA 20/25 - asymptomatic, no metamorphopsia - no indication for surgery at this time - monitor for now - f/u 3 mos -- DFE/OCT  5. Pseudophakia OU  - s/p CE/IOL OU (S.  [place])  - IOLs in good position, doing well  - post op drops per Dr. Katy Fitch  - continue to monitor  Ophthalmic Meds Ordered this visit:  Meds ordered this encounter  Medications   Bevacizumab (AVASTIN) SOLN 1.25 mg     Return in about 7 weeks (around 09/12/2022) for BRVO OS , DFE, OCT, Possible injection.  There are no Patient Instructions on file for this visit.  This document serves as a record of services personally performed by Gardiner Sleeper, MD, PhD. It was created on their behalf by Orvan Falconer, an ophthalmic technician. The creation of this record is the provider's dictation and/or activities during the visit.    Electronically signed by: Orvan Falconer, OA, 07/25/22  9:24 AM  This document serves as a record of services personally performed by Gardiner Sleeper, MD, PhD. It was created on their behalf by Renaldo Reel, COT an ophthalmic technician. The creation of this record is the provider's dictation and/or activities during the visit.    Electronically signed by:  Renaldo Reel, COT  07/25/22 9:24 AM  Gardiner Sleeper, M.D., Ph.D. Diseases & Surgery of the Retina and Vitreous [facility]  I have reviewed the above documentation for accuracy and completeness, and I agree with the above. Gardiner Sleeper, M.D., Ph.D. 07/25/22 9:24 AM  Abbreviations: M myopia (nearsighted); A astigmatism; H hyperopia (farsighted); P presbyopia; Mrx spectacle prescription;  CTL contact lenses; OD right eye; OS left eye; OU both eyes  XT exotropia; ET esotropia; PEK punctate epithelial keratitis; PEE punctate epithelial erosions; DES dry eye syndrome; MGD meibomian gland dysfunction; ATs artificial tears; PFAT's preservative free artificial tears; [place] nuclear sclerotic cataract; PSC posterior subcapsular cataract; ERM epi-retinal membrane; PVD posterior vitreous detachment; RD retinal detachment; DM diabetes mellitus; DR diabetic retinopathy; NPDR  non-proliferative diabetic retinopathy; PDR proliferative diabetic retinopathy; CSME clinically significant macular edema; DME diabetic macular edema; dbh dot blot hemorrhages; CWS cotton wool spot; POAG primary open angle glaucoma; C/D cup-to-disc ratio; HVF humphrey visual field; GVF goldmann visual field; OCT optical coherence tomography; IOP intraocular pressure; BRVO Branch retinal vein occlusion; CRVO central retinal vein occlusion; CRAO central retinal artery occlusion; BRAO branch retinal artery occlusion; RT retinal tear; SB scleral buckle; PPV pars plana vitrectomy; VH Vitreous hemorrhage; PRP panretinal laser photocoagulation; IVK intravitreal kenalog; VMT vitreomacular traction; MH Macular hole;  NVD neovascularization of the disc; NVE neovascularization elsewhere; AREDS age related eye disease study; ARMD age related macular degeneration; POAG primary open angle glaucoma; EBMD epithelial/anterior basement membrane dystrophy; ACIOL anterior chamber intraocular lens; IOL intraocular lens; PCIOL posterior chamber intraocular lens; Phaco/IOL phacoemulsification with intraocular lens placement; [place] photorefractive keratectomy; LASIK laser assisted in situ keratomileusis; HTN hypertension; DM diabetes mellitus; COPD chronic obstructive pulmonary disease

## 2022-09-06 NOTE — Progress Notes (Signed)
[facility] - Clinic Note  09/12/2022    CHIEF COMPLAINT Patient presents for Retina Follow Up  HISTORY OF PRESENT ILLNESS: Roger Howe is a 66 y.o. male who presents to the clinic today for:   HPI     Retina Follow Up   Patient presents with  CRVO/BRVO.  In left eye.  Severity is moderate.  Duration of 7 weeks.  Since onset it is stable.  I, the attending physician,  performed the HPI with the patient and updated documentation appropriately.        Comments   Patient feels that the vision is better than before.       Last edited by [name], MD on 09/12/2022  1:03 PM.    Pt states his vision is a lot better than before  Referring physician: Salli Real, MD [address] [place],  [place] [address]  HISTORICAL INFORMATION:  Selected notes from the MEDICAL RECORD NUMBER Referred by Dr. [name] for eval of BRVO OS   CURRENT MEDICATIONS: No current outpatient medications on file. (Ophthalmic Drugs)   No current facility-administered medications for this visit. (Ophthalmic Drugs)   Current Outpatient Medications (Other)  Medication Sig   aspirin 81 MG chewable tablet Chew 81 mg by mouth daily.   atorvastatin (LIPITOR) 20 MG tablet Take 20 mg by mouth daily at 6 PM.   carvedilol (COREG) 3.125 MG tablet TAKE 1 TABLET BY MOUTH TWICE A DAY *PLS CALL OFFICE FOR ADDITIONAL REFILLS* (Patient not taking: Reported on 11/15/2021)   fenofibrate micronized (LOFIBRA) 134 MG capsule Take 134 mg by mouth daily.   lisinopril-hydrochlorothiazide (ZESTORETIC) 10-12.5 MG tablet Take 1 tablet by mouth daily.   metoprolol succinate (TOPROL-XL) 25 MG 24 hr tablet Take 25 mg by mouth daily. (Patient not taking: Reported on 11/15/2021)   No current facility-administered medications for this visit. (Other)   REVIEW OF SYSTEMS: ROS   Positive for: Cardiovascular, Eyes Negative for: Constitutional, Gastrointestinal, Neurological, Skin, Genitourinary, Musculoskeletal, HENT,  Endocrine, Respiratory, Psychiatric, Allergic/Imm, Heme/Lymph Last edited by Julieanne Cotton, COT on 09/12/2022  9:08 AM.     ALLERGIES No Known Allergies  PAST MEDICAL HISTORY Past Medical History:  Diagnosis Date   Abdominal aortic atherosclerosis (HCC)    Seen on catheterization   CAD, multiple vessel 05/18/2014   Coronary artery disease    Essential hypertension    Hypertensive retinopathy    NSTEMI (non-ST elevated myocardial infarction) (HCC) 05/17/2014   Post CABG Echo 09/19/2014: Normal EF 55-60% with mild Conc LVH. 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   LIMA to LAD, SVG to D1, Sequential SVG to PDA and RPL2, EVH via right thigh and leg   Past Surgical History:  Procedure Laterality Date   CARDIAC CATHETERIZATION  05/17/2014   DR HARDING: 95% proximal LAD, 90% ramus intermedius, 95% mid circumflex after OM1, neither percent RPDA   CORONARY ARTERY BYPASS GRAFT N/A 05/20/2014   Procedure: CORONARY ARTERY BYPASS GRAFTING (CABG) x 4,  LIMA-MID LAD,  SVG-DIAGONAL,  SEQUENTIAL SVG -PDA, PLB #2;  Surgeon: [name], MD;  Location: [facility] OR;  Service: Open Heart Surgery;  Laterality: N/A;   INTRAOPERATIVE TRANSESOPHAGEAL ECHOCARDIOGRAM N/A 05/20/2014   Procedure: INTRAOPERATIVE TRANSESOPHAGEAL ECHOCARDIOGRAM;  Surgeon: [name], MD;  Location: [facility] OR;  Service: Open Heart Surgery;  Laterality: N/A;   LEFT HEART CATHETERIZATION WITH CORONARY ANGIOGRAM N/A 05/18/2014   Procedure: LEFT HEART CATHETERIZATION WITH CORONARY ANGIOGRAM;  Surgeon: Onalee Hua  Starr Lake, MD;  Location: [facility] CATH LAB;  Service: Cardiovascular;  Laterality: N/A;   TRANSTHORACIC ECHOCARDIOGRAM  09/14/2014   Normal EF 55-60% with mild Conc LVH. No regional WMA, essentially normal   FAMILY HISTORY Family History  Problem Relation Age of Onset   Heart disease Brother        lives in [place]   Heart attack Neg Hx    Stroke Neg Hx    SOCIAL HISTORY Social History   Tobacco Use    Smoking status: Never   Smokeless tobacco: Never  Substance Use Topics   Alcohol use: Never   Drug use: Never       OPHTHALMIC EXAM: Base Eye Exam     Visual Acuity (Snellen - Linear)       Right Left   Dist [place] 20/30 20/40   Dist ph [address] NI NI         Tonometry (Tonopen, 9:13 AM)       Right Left   Pressure 10 14         Pupils       Dark Light Shape React APD   Right 4 3 Round Brisk None   Left 4 3 Round Brisk None         Visual Fields       Left Right    Full Full         Extraocular Movement       Right Left    Full, Ortho Full, Ortho         Neuro/Psych     Oriented x3: Yes   Mood/Affect: Normal         Dilation     Both eyes: 1.0% Mydriacyl, 2.5% Phenylephrine @ 9:09 AM           Slit Lamp and Fundus Exam     Slit Lamp Exam       Right Left   Lids/Lashes Dermatochalasis - upper lid Dermatochalasis - upper lid   Conjunctiva/Sclera White and quiet Mild melanosis   Cornea Arcus, Well healed cataract wound Arcus, Well healed cataract wound   Anterior Chamber Deep and quiet Deep and quiet   Iris Round and dilated round and poorly dilated   Lens PCIOL in good position PCIOL in good position   Anterior Vitreous Vitreous syneresis, condensations Vitreous syneresis         Fundus Exam       Right Left   Disc Pink and sharp, mild PPA Pink and sharp, mild temporal Pallor   C/D Ratio 0.3 0.4   Macula Flat, Good foveal reflex, RPE mottling, no heme or edema Good foveal reflex, persistent cystic changes, IRH/CWS superior and temporal macula -- improved, +ERM SN macula   Vessels Vascular attenuation, Copper wiring, attenuated, Tortuous Vascular attenuation, Tortuous, flame hemes along ST arcades resolved--BRVO/BRAO   Periphery Attached, no heme Attached, IRH superior midzone (extension of ST BRVO)-- improved           IMAGING AND PROCEDURES  Imaging and Procedures for 09/12/2022  OCT, Retina - OU - Both Eyes       Right  Eye Quality was good. Central Foveal Thickness: 270. Progression has been stable. Findings include normal foveal contour, no IRF, no SRF, retinal drusen .   Left Eye Quality was good. Central Foveal Thickness: 351. Progression has worsened. Findings include no IRF, no SRF, abnormal foveal contour, intraretinal hyper-reflective material, epiretinal membrane, inner retinal atrophy (Mild interval increase in IRF temporal macula --  BRVO w/BRAO component, persistent ERM with blunting of foveal contour, partial PVD).   Notes *Images captured and stored on drive  Diagnosis / Impression:  OD: NFP, no IRF/SRF OS: Mild interval increase in IRF temporal macula -- BRVO w/BRAO component, persistent ERM with blunting of foveal contour, partial PVD  Clinical management:  See below  Abbreviations: NFP - Normal foveal profile. CME - cystoid macular edema. PED - pigment epithelial detachment. IRF - intraretinal fluid. SRF - subretinal fluid. EZ - ellipsoid zone. ERM - epiretinal membrane. ORA - outer retinal atrophy. ORT - outer retinal tubulation. SRHM - subretinal hyper-reflective material. IRHM - intraretinal hyper-reflective material      Intravitreal Injection, Pharmacologic Agent - OS - Left Eye       Time Out 09/12/2022. 10:16 AM. Confirmed correct patient, procedure, site, and patient consented.   Anesthesia Topical anesthesia was used. Anesthetic medications included Lidocaine 2%, Proparacaine 0.5%.   Procedure Preparation included 5% betadine to ocular surface, eyelid speculum. A (32g) needle was used.   Injection: 1.25 mg Bevacizumab 1.25mg /0.24ml   Route: Intravitreal, Site: Left Eye   NDC: P3213405, Lot: [id number], Expiration date: 10/23/2022   Post-op Post injection exam found visual acuity of at least counting fingers. The patient tolerated the procedure well. There were no complications. The patient received written and verbal post procedure care education. Post injection  medications were not given.            ASSESSMENT/PLAN:    ICD-10-CM   1. Branch retinal vein occlusion of left eye with macular edema  H34.8320 OCT, Retina - OU - Both Eyes    Intravitreal Injection, Pharmacologic Agent - OS - Left Eye    Bevacizumab (AVASTIN) SOLN 1.25 mg    2. Essential hypertension  I10     3. Hypertensive retinopathy of both eyes  H35.033     4. Epiretinal membrane (ERM) of left eye  H35.372     5. Pseudophakia of both eyes  Z96.1      1. BRVO w/ CME OS - s/p IVA OS #1 (9.21.22), #2 (10.19.22), #3 (11.16.22), #4 (12.28.22), #5 (02.22.23), #6 (06.07.23), #7 (07.26.23) - pt was first seen on 8.10.22, but could not be treated with intravitreal injection because BP was extremely high R arm (199/122) and L arm (186/110) - pt was referred back to PCP, who has since restarted BP meds - BCVA OS decreased to 20/40 from 20/25  - OCT shows Mild interval increase in IRF temporal macula -- BRVO w/BRAO component, persistent ERM with blunting of foveal contour, partial PVD at 7 weeks **history of increase in IRF at 8 weeks, noted on 04.19.23 OCT** **history of increase in IRF at 7 weeks, noted on 09.13.23 OCT** - consider switching to Eylea at next visit -- no prior auth required - recommend IVA OS #8 today, 09.13.23 -- w/ f/u back to 5-6 wks - Pt in agreement - RBA of procedure discussed, questions answered - informed consent obtained and re-signed, 06.07.23 - see procedure note  - f/u in 5-6 weeks, DFE/OCT, possible injection  2,3. Hypertensive retinopathy OU  - BP at presentation (8.10.22) were extremely high -- R arm (199/122) and L arm (186/110)  - pt was referred back to PCP, who has since restarted BP meds - discussed importance of tight BP control.  4. Epiretinal membrane, left eye  - mild ERM - BCVA 20/25 - asymptomatic, no metamorphopsia - no indication for surgery at this time - monitor for now - f/u 3  mos -- DFE/OCT  5. Pseudophakia OU  - s/p  CE/IOL OU (S. Groat)  - IOLs in good position, doing well  - post op drops per Dr. [name]  - continue to monitor  Ophthalmic Meds Ordered this visit:  Meds ordered this encounter  Medications   Bevacizumab (AVASTIN) SOLN 1.25 mg     Return for f/u 5-6 weeks, BRVO OS, DFE, OCT.  There are no Patient Instructions on file for this visit.  This document serves as a record of services personally performed by Karie Chimera, MD, PhD. It was created on their behalf by De Blanch, an ophthalmic technician. The creation of this record is the provider's dictation and/or activities during the visit.    Electronically signed by: De Blanch, OA, 09/12/22  1:04 PM  This document serves as a record of services personally performed by Karie Chimera, MD, PhD. It was created on their behalf by Glee Arvin. Manson Passey, OA an ophthalmic technician. The creation of this record is the provider's dictation and/or activities during the visit.    Electronically signed by: Glee Arvin. Manson Passey, [place] 09.13.2023 1:04 PM   Karie Chimera, M.D., Ph.D. Diseases & Surgery of the Retina and Vitreous [facility]  I have reviewed the above documentation for accuracy and completeness, and I agree with the above. Karie Chimera, M.D., Ph.D. 09/12/22 1:07 PM  Abbreviations: M myopia (nearsighted); A astigmatism; H hyperopia (farsighted); P presbyopia; Mrx spectacle prescription;  CTL contact lenses; OD right eye; OS left eye; OU both eyes  XT exotropia; ET esotropia; PEK punctate epithelial keratitis; PEE punctate epithelial erosions; DES dry eye syndrome; MGD meibomian gland dysfunction; ATs artificial tears; PFAT's preservative free artificial tears; NSC nuclear sclerotic cataract; PSC posterior subcapsular cataract; ERM epi-retinal membrane; PVD posterior vitreous detachment; RD retinal detachment; DM diabetes mellitus; DR diabetic retinopathy; NPDR non-proliferative diabetic retinopathy; PDR  proliferative diabetic retinopathy; CSME clinically significant macular edema; DME diabetic macular edema; dbh dot blot hemorrhages; CWS cotton wool spot; POAG primary open angle glaucoma; C/D cup-to-disc ratio; HVF humphrey visual field; GVF goldmann visual field; OCT optical coherence tomography; IOP intraocular pressure; BRVO Branch retinal vein occlusion; CRVO central retinal vein occlusion; CRAO central retinal artery occlusion; BRAO branch retinal artery occlusion; RT retinal tear; SB scleral buckle; PPV pars plana vitrectomy; VH Vitreous hemorrhage; PRP panretinal laser photocoagulation; IVK intravitreal kenalog; VMT vitreomacular traction; MH Macular hole;  NVD neovascularization of the disc; NVE neovascularization elsewhere; AREDS age related eye disease study; ARMD age related macular degeneration; POAG primary open angle glaucoma; EBMD epithelial/anterior basement membrane dystrophy; ACIOL anterior chamber intraocular lens; IOL intraocular lens; PCIOL posterior chamber intraocular lens; Phaco/IOL phacoemulsification with intraocular lens placement; PRK photorefractive keratectomy; LASIK laser assisted in situ keratomileusis; HTN hypertension; DM diabetes mellitus; COPD chronic obstructive pulmonary disease

## 2022-09-12 ENCOUNTER — Ambulatory Visit (INDEPENDENT_AMBULATORY_CARE_PROVIDER_SITE_OTHER): Payer: Medicare Other | Admitting: Ophthalmology

## 2022-09-12 ENCOUNTER — Encounter (INDEPENDENT_AMBULATORY_CARE_PROVIDER_SITE_OTHER): Payer: Self-pay | Admitting: Ophthalmology

## 2022-09-12 DIAGNOSIS — H35372 Puckering of macula, left eye: Secondary | ICD-10-CM | POA: Diagnosis not present

## 2022-09-12 DIAGNOSIS — H35033 Hypertensive retinopathy, bilateral: Secondary | ICD-10-CM | POA: Diagnosis not present

## 2022-09-12 DIAGNOSIS — H34832 Tributary (branch) retinal vein occlusion, left eye, with macular edema: Secondary | ICD-10-CM

## 2022-09-12 DIAGNOSIS — I10 Essential (primary) hypertension: Secondary | ICD-10-CM | POA: Diagnosis not present

## 2022-09-12 DIAGNOSIS — Z961 Presence of intraocular lens: Secondary | ICD-10-CM

## 2022-09-12 MED ORDER — BEVACIZUMAB CHEMO INJECTION 1.25MG/0.05ML SYRINGE FOR KALEIDOSCOPE
1.2500 mg | INTRAVITREAL | Status: AC | PRN
  Administered 2022-09-12: 1.25 mg via INTRAVITREAL

## 2022-10-10 NOTE — Progress Notes (Signed)
[facility] - Clinic Note  10/17/2022    CHIEF COMPLAINT Patient presents for Retina Follow Up  HISTORY OF PRESENT ILLNESS: Roger Howe is a 66 y.o. male who presents to the clinic today for:   HPI     Retina Follow Up   Patient presents with  CRVO/BRVO.  In left eye.  Severity is moderate.  Duration of 5 weeks.  Since onset it is stable.  I, the attending physician,  performed the HPI with the patient and updated documentation appropriately.        Comments   5 week Retina follow up BRVO and IVA OS pt states vision seems little better since his last appointment       Last edited by [name], MD on 10/17/2022 11:50 AM.    Patient is complaining of watery eyes and dryness.   Referring physician: Salli Real, MD [address] [place],  [place] [address]  HISTORICAL INFORMATION:  Selected notes from the MEDICAL RECORD NUMBER Referred by Dr. [name] for eval of BRVO OS   CURRENT MEDICATIONS: No current outpatient medications on file. (Ophthalmic Drugs)   No current facility-administered medications for this visit. (Ophthalmic Drugs)   Current Outpatient Medications (Other)  Medication Sig   aspirin 81 MG chewable tablet Chew 81 mg by mouth daily.   atorvastatin (LIPITOR) 20 MG tablet Take 20 mg by mouth daily at 6 PM.   carvedilol (COREG) 3.125 MG tablet TAKE 1 TABLET BY MOUTH TWICE A DAY *PLS CALL OFFICE FOR ADDITIONAL REFILLS* (Patient not taking: Reported on 11/15/2021)   fenofibrate micronized (LOFIBRA) 134 MG capsule Take 134 mg by mouth daily.   lisinopril-hydrochlorothiazide (ZESTORETIC) 10-12.5 MG tablet Take 1 tablet by mouth daily.   metoprolol succinate (TOPROL-XL) 25 MG 24 hr tablet Take 25 mg by mouth daily. (Patient not taking: Reported on 11/15/2021)   No current facility-administered medications for this visit. (Other)   REVIEW OF SYSTEMS:   ALLERGIES No Known Allergies  PAST MEDICAL HISTORY Past Medical History:   Diagnosis Date   Abdominal aortic atherosclerosis (HCC)    Seen on catheterization   CAD, multiple vessel 05/18/2014   Coronary artery disease    Essential hypertension    Hypertensive retinopathy    NSTEMI (non-ST elevated myocardial infarction) (HCC) 05/17/2014   Post CABG Echo 09/19/2014: Normal EF 55-60% with mild Conc LVH. 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   LIMA to LAD, SVG to D1, Sequential SVG to PDA and RPL2, EVH via right thigh and leg   Past Surgical History:  Procedure Laterality Date   CARDIAC CATHETERIZATION  05/17/2014   DR HARDING: 95% proximal LAD, 90% ramus intermedius, 95% mid circumflex after OM1, neither percent RPDA   CORONARY ARTERY BYPASS GRAFT N/A 05/20/2014   Procedure: CORONARY ARTERY BYPASS GRAFTING (CABG) x 4,  LIMA-MID LAD,  SVG-DIAGONAL,  SEQUENTIAL SVG -PDA, PLB #2;  Surgeon: [name], MD;  Location: [facility] OR;  Service: Open Heart Surgery;  Laterality: N/A;   INTRAOPERATIVE TRANSESOPHAGEAL ECHOCARDIOGRAM N/A 05/20/2014   Procedure: INTRAOPERATIVE TRANSESOPHAGEAL ECHOCARDIOGRAM;  Surgeon: [name], MD;  Location: [facility];  Service: Open Heart Surgery;  Laterality: N/A;   LEFT HEART CATHETERIZATION WITH CORONARY ANGIOGRAM N/A 05/18/2014   Procedure: LEFT HEART CATHETERIZATION WITH CORONARY ANGIOGRAM;  Surgeon: [name], MD;  Location: [facility] CATH LAB;  Service: Cardiovascular;  Laterality: N/A;   TRANSTHORACIC ECHOCARDIOGRAM  09/14/2014   Normal EF 55-60% with mild  Conc LVH. No regional WMA, essentially normal   FAMILY HISTORY Family History  Problem Relation Age of Onset   Heart disease Brother        lives in [place]   Heart attack Neg Hx    Stroke Neg Hx    SOCIAL HISTORY Social History   Tobacco Use   Smoking status: Never   Smokeless tobacco: Never  Substance Use Topics   Alcohol use: Never   Drug use: Never       OPHTHALMIC EXAM: Base Eye Exam     Visual Acuity (Snellen - Linear)       Right Left    Dist [place] 20/30 -2 20/40   Dist ph [place] NI NI         Tonometry (Tonopen, 9:17 AM)       Right Left   Pressure 15 14         Pupils       Dark Light Shape React APD   Right 4 3 Round Brisk None   Left 4 3 Round Brisk None         Visual Fields       Left Right    Full Full         Extraocular Movement       Right Left    Full Full         Neuro/Psych     Oriented x3: Yes   Mood/Affect: Normal         Dilation     Both eyes: 1.0% Mydriacyl, 2.5% Phenylephrine @ 9:17 AM           Slit Lamp and Fundus Exam     Slit Lamp Exam       Right Left   Lids/Lashes Dermatochalasis - upper lid Dermatochalasis - upper lid   Conjunctiva/Sclera White and quiet Mild melanosis   Cornea Arcus, Well healed cataract wound Arcus, Well healed cataract wound   Anterior Chamber Deep and quiet Deep and quiet   Iris Round and dilated round and poorly dilated   Lens PCIOL in good position PCIOL in good position   Anterior Vitreous Vitreous syneresis, condensations Vitreous syneresis         Fundus Exam       Right Left   Disc Pink and sharp, mild PPA Pink and sharp, mild temporal Pallor   C/D Ratio 0.3 0.4   Macula Flat, Good foveal reflex, RPE mottling, no heme or edema Good foveal reflex, persistent cystic changes, IRH/CWS superior and temporal macula -- improved, +ERM SN macula   Vessels Vascular attenuation, Copper wiring, Tortuous Vascular attenuation, Tortuous, flame hemes along ST arcades resolved--BRVO/BRAO, early sclerosis ST arcades   Periphery Attached, no heme Attached, IRH superior midzone (extension of ST BRVO)-- improved           IMAGING AND PROCEDURES  Imaging and Procedures for 10/17/2022  OCT, Retina - OU - Both Eyes       Right Eye Quality was good. Central Foveal Thickness: 269. Progression has been stable. Findings include normal foveal contour, no IRF, no SRF, retinal drusen .   Left Eye Quality was good. Central Foveal Thickness:  339. Progression has been stable. Findings include no IRF, no SRF, abnormal foveal contour, intraretinal hyper-reflective material, epiretinal membrane, inner retinal atrophy (Persistent in IRF temporal macula -- BRVO w/BRAO component, persistent ERM with blunting of foveal contour, partial PVD).   Notes *Images captured and stored on drive  Diagnosis / Impression:  OD: NFP, no IRF/SRF OS: Persistent in IRF temporal macula -- BRVO w/BRAO component, persistent ERM with blunting of foveal contour, partial PVD  Clinical management:  See below  Abbreviations: NFP - Normal foveal profile. CME - cystoid macular edema. PED - pigment epithelial detachment. IRF - intraretinal fluid. SRF - subretinal fluid. EZ - ellipsoid zone. ERM - epiretinal membrane. ORA - outer retinal atrophy. ORT - outer retinal tubulation. SRHM - subretinal hyper-reflective material. IRHM - intraretinal hyper-reflective material      Intravitreal Injection, Pharmacologic Agent - OS - Left Eye       Time Out 10/17/2022. 9:34 AM. Confirmed correct patient, procedure, site, and patient consented.   Anesthesia Topical anesthesia was used. Anesthetic medications included Lidocaine 2%, Proparacaine 0.5%.   Procedure Preparation included 5% betadine to ocular surface, eyelid speculum. A (32g) needle was used.   Injection: 1.25 mg Bevacizumab 1.25mg /0.52ml   Route: Intravitreal, Site: Left Eye   NDC: H061816, Lot: [id number], Expiration date: 11/14/2022   Post-op Post injection exam found visual acuity of at least counting fingers. The patient tolerated the procedure well. There were no complications. The patient received written and verbal post procedure care education. Post injection medications were not given.            ASSESSMENT/PLAN:    ICD-10-CM   1. Branch retinal vein occlusion of left eye with macular edema  H34.8320 OCT, Retina - OU - Both Eyes    Intravitreal Injection, Pharmacologic Agent - OS -  Left Eye    Bevacizumab (AVASTIN) SOLN 1.25 mg    2. Essential hypertension  I10     3. Hypertensive retinopathy of both eyes  H35.033     4. Epiretinal membrane (ERM) of left eye  H35.372     5. Pseudophakia of both eyes  Z96.1      1. BRVO w/ CME OS - s/p IVA OS #1 (9.21.22), #2 (10.19.22), #3 (11.16.22), #4 (12.28.22), #5 (02.22.23), #6 (06.07.23), #7 (07.26.23), #8 (09.13.23) - pt was first seen on 8.10.22, but could not be treated with intravitreal injection because BP was extremely high R arm (199/122) and L arm (186/110) - pt was referred back to PCP, who has since restarted BP meds - BCVA OS decreased to 20/40 from 20/25  - OCT shows Mild interval increase in IRF temporal macula -- BRVO w/BRAO component, persistent ERM with blunting of foveal contour, partial PVD at 5 weeks **history of increase in IRF at 8 weeks, noted on 04.19.23 OCT** **history of increase in IRF at 7 weeks, noted on 09.13.23 OCT** - consider switching to Eylea at next visit -- no prior auth required - recommend IVA OS #9 today, 10.18.23 -- w/ f/u again at 5 wks - Pt in agreement - RBA of procedure discussed, questions answered - informed consent obtained and re-signed, 06.07.23 - see procedure note  - f/u in 5 weeks, DFE/OCT, possible injection  2,3. Hypertensive retinopathy OU  - BP at presentation (8.10.22) were extremely high -- R arm (199/122) and L arm (186/110)  - pt was referred back to PCP, who has since restarted BP meds - discussed importance of tight BP control.  4. Epiretinal membrane, left eye  - mild ERM - BCVA 20/25 - asymptomatic, no metamorphopsia - no indication for surgery at this time - monitor for now - f/u 3 mos -- DFE/OCT  5. Pseudophakia OU  - s/p CE/IOL OU (S. Groat)  - IOLs in good position, doing well  - post op  drops per Dr. [name]  - continue to monitor  Ophthalmic Meds Ordered this visit:  Meds ordered this encounter  Medications   Bevacizumab (AVASTIN) SOLN  1.25 mg     Return in about 5 weeks (around 11/21/2022) for f/u BRVO w/ CME OS , DFE, OCT, Possible, IVA, OS.  There are no Patient Instructions on file for this visit.  This document serves as a record of services personally performed by Karie Chimera, MD, PhD. It was created on their behalf by De Blanch, an ophthalmic technician. The creation of this record is the provider's dictation and/or activities during the visit.    Electronically signed by: De Blanch, OA, 10/17/22  11:51 AM  This document serves as a record of services personally performed by Karie Chimera, MD, PhD. It was created on their behalf by Gerilyn Nestle, COT an ophthalmic technician. The creation of this record is the provider's dictation and/or activities during the visit.    Electronically signed by:  Gerilyn Nestle, COT  10.18.23 11:51 AM   Karie Chimera, M.D., Ph.D. Diseases & Surgery of the Retina and Vitreous [facility]  I have reviewed the above documentation for accuracy and completeness, and I agree with the above. Karie Chimera, M.D., Ph.D. 10/17/22 11:53 AM  Abbreviations: M myopia (nearsighted); A astigmatism; H hyperopia (farsighted); P presbyopia; Mrx spectacle prescription;  CTL contact lenses; OD right eye; OS left eye; OU both eyes  XT exotropia; ET esotropia; PEK punctate epithelial keratitis; PEE punctate epithelial erosions; DES dry eye syndrome; MGD meibomian gland dysfunction; ATs artificial tears; PFAT's preservative free artificial tears; NSC nuclear sclerotic cataract; PSC posterior subcapsular cataract; ERM epi-retinal membrane; PVD posterior vitreous detachment; RD retinal detachment; DM diabetes mellitus; DR diabetic retinopathy; NPDR non-proliferative diabetic retinopathy; PDR proliferative diabetic retinopathy; CSME clinically significant macular edema; DME diabetic macular edema; dbh dot blot hemorrhages; CWS cotton wool spot; POAG primary  open angle glaucoma; C/D cup-to-disc ratio; HVF humphrey visual field; GVF goldmann visual field; OCT optical coherence tomography; IOP intraocular pressure; BRVO Branch retinal vein occlusion; CRVO central retinal vein occlusion; CRAO central retinal artery occlusion; BRAO branch retinal artery occlusion; RT retinal tear; SB scleral buckle; PPV pars plana vitrectomy; VH Vitreous hemorrhage; PRP panretinal laser photocoagulation; IVK intravitreal kenalog; VMT vitreomacular traction; MH Macular hole;  NVD neovascularization of the disc; NVE neovascularization elsewhere; AREDS age related eye disease study; ARMD age related macular degeneration; POAG primary open angle glaucoma; EBMD epithelial/anterior basement membrane dystrophy; ACIOL anterior chamber intraocular lens; IOL intraocular lens; PCIOL posterior chamber intraocular lens; Phaco/IOL phacoemulsification with intraocular lens placement; PRK photorefractive keratectomy; LASIK laser assisted in situ keratomileusis; HTN hypertension; DM diabetes mellitus; COPD chronic obstructive pulmonary disease

## 2022-10-17 ENCOUNTER — Encounter (INDEPENDENT_AMBULATORY_CARE_PROVIDER_SITE_OTHER): Payer: Self-pay | Admitting: Ophthalmology

## 2022-10-17 ENCOUNTER — Ambulatory Visit (INDEPENDENT_AMBULATORY_CARE_PROVIDER_SITE_OTHER): Payer: Medicare Other | Admitting: Ophthalmology

## 2022-10-17 DIAGNOSIS — I10 Essential (primary) hypertension: Secondary | ICD-10-CM

## 2022-10-17 DIAGNOSIS — H34832 Tributary (branch) retinal vein occlusion, left eye, with macular edema: Secondary | ICD-10-CM | POA: Diagnosis not present

## 2022-10-17 DIAGNOSIS — H35372 Puckering of macula, left eye: Secondary | ICD-10-CM

## 2022-10-17 DIAGNOSIS — H35033 Hypertensive retinopathy, bilateral: Secondary | ICD-10-CM

## 2022-10-17 DIAGNOSIS — Z961 Presence of intraocular lens: Secondary | ICD-10-CM

## 2022-10-17 MED ORDER — BEVACIZUMAB CHEMO INJECTION 1.25MG/0.05ML SYRINGE FOR KALEIDOSCOPE
1.2500 mg | INTRAVITREAL | Status: AC | PRN
  Administered 2022-10-17: 1.25 mg via INTRAVITREAL

## 2022-11-14 NOTE — Progress Notes (Addendum)
[facility] Note  11/21/2022    CHIEF COMPLAINT Patient presents for Retina Follow Up  HISTORY OF PRESENT ILLNESS: Roger Howe is a 66 y.o. male who presents to the clinic today for:   HPI     Retina Follow Up   Patient presents with  CRVO/BRVO.  In left eye.  Severity is moderate.  Duration of 5 weeks.  Since onset it is stable.  I, the attending physician,  performed the HPI with the patient and updated documentation appropriately.        Comments   PT here for 5 wk ret f/u BRVO OS. Pt states VA is improving.       Last edited by Bernarda Caffey, MD on 11/21/2022  8:35 PM.     Referring physician: Debbra Riding, MD [address] [place],  [place] [address]  HISTORICAL INFORMATION:  Selected notes from the MEDICAL RECORD NUMBER Referred by Dr. Katy Fitch for eval of BRVO OS   CURRENT MEDICATIONS: No current outpatient medications on file. (Ophthalmic Drugs)   No current facility-administered medications for this visit. (Ophthalmic Drugs)   Current Outpatient Medications (Other)  Medication Sig   aspirin 81 MG chewable tablet Chew 81 mg by mouth daily.   atorvastatin (LIPITOR) 20 MG tablet Take 20 mg by mouth daily at 6 PM.   fenofibrate micronized (LOFIBRA) 134 MG capsule Take 134 mg by mouth daily.   lisinopril-hydrochlorothiazide (ZESTORETIC) 10-12.5 MG tablet Take 1 tablet by mouth daily.   metoprolol succinate (TOPROL-XL) 25 MG 24 hr tablet Take 25 mg by mouth daily.   carvedilol (COREG) 3.125 MG tablet TAKE 1 TABLET BY MOUTH TWICE A DAY *PLS CALL OFFICE FOR ADDITIONAL REFILLS* (Patient not taking: Reported on 11/15/2021)   No current facility-administered medications for this visit. (Other)   REVIEW OF SYSTEMS: ROS   Positive for: Cardiovascular, Eyes Negative for: Constitutional, Gastrointestinal, Neurological, Skin, Genitourinary, Musculoskeletal, HENT, Endocrine, Respiratory, Psychiatric, Allergic/Imm, Heme/Lymph Last  edited by Kingsley Spittle, COT on 11/21/2022  9:06 AM.     ALLERGIES No Known Allergies  PAST MEDICAL HISTORY Past Medical History:  Diagnosis Date   Abdominal aortic atherosclerosis ([place])    Seen on catheterization   CAD, multiple vessel 05/18/2014   Coronary artery disease    Essential hypertension    Hypertensive retinopathy    NSTEMI (non-ST elevated myocardial infarction) ([place]) 05/17/2014   Post CABG Echo 09/19/2014: Normal EF 55-60% with mild Conc LVH. 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   LIMA to LAD, SVG to D1, Sequential SVG to PDA and RPL2, EVH via right thigh and leg   Past Surgical History:  Procedure Laterality Date   CARDIAC CATHETERIZATION  05/17/2014   DR HARDING: 95% proximal LAD, 90% ramus intermedius, 95% mid circumflex after OM1, neither percent RPDA   CORONARY ARTERY BYPASS GRAFT N/A 05/20/2014   Procedure: CORONARY ARTERY BYPASS GRAFTING (CABG) x 4,  LIMA-MID LAD,  SVG-DIAGONAL,  SEQUENTIAL SVG -PDA, PLB #2;  Surgeon: Rexene Alberts, MD;  Location: [facility];  Service: Open Heart Surgery;  Laterality: N/A;   INTRAOPERATIVE TRANSESOPHAGEAL ECHOCARDIOGRAM N/A 05/20/2014   Procedure: INTRAOPERATIVE TRANSESOPHAGEAL ECHOCARDIOGRAM;  Surgeon: Rexene Alberts, MD;  Location: [facility];  Service: Open Heart Surgery;  Laterality: N/A;   LEFT HEART CATHETERIZATION WITH CORONARY ANGIOGRAM N/A 05/18/2014   Procedure: LEFT HEART CATHETERIZATION WITH CORONARY ANGIOGRAM;  Surgeon: Leonie Man, MD;  Location: [facility] CATH  LAB;  Service: Cardiovascular;  Laterality: N/A;   TRANSTHORACIC ECHOCARDIOGRAM  09/14/2014   Normal EF 55-60% with mild Conc LVH. No regional WMA, essentially normal   FAMILY HISTORY Family History  Problem Relation Age of Onset   Heart disease Brother        lives in [place]   Heart attack Neg Hx    Stroke Neg Hx    SOCIAL HISTORY Social History   Tobacco Use   Smoking status: Never   Smokeless tobacco: Never  Substance Use Topics    Alcohol use: Never   Drug use: Never       OPHTHALMIC EXAM: Base Eye Exam     Visual Acuity (Snellen - Linear)       Right Left   Dist [place] 20/30 -1 20/40   Dist ph  NI NI         Tonometry (Tonopen, 9:11 AM)       Right Left   Pressure 14 18         Pupils       Pupils Dark Light Shape React APD   Right PERRL 4 3 Round Brisk None   Left PERRL 4 3 Round Brisk None         Visual Fields (Counting fingers)       Left Right    Full Full         Extraocular Movement       Right Left    Full, Ortho Full, Ortho         Neuro/Psych     Oriented x3: Yes   Mood/Affect: Normal         Dilation     Both eyes: 1.0% Mydriacyl, 2.5% Phenylephrine @ 9:12 AM           Slit Lamp and Fundus Exam     Slit Lamp Exam       Right Left   Lids/Lashes Dermatochalasis - upper lid Dermatochalasis - upper lid   Conjunctiva/Sclera White and quiet Mild melanosis   Cornea Arcus, Well healed cataract wound Arcus, Well healed cataract wound   Anterior Chamber Deep and quiet Deep and quiet   Iris Round and dilated round and poorly dilated   Lens PCIOL in good position PCIOL in good position   Anterior Vitreous Vitreous syneresis, condensations Vitreous syneresis         Fundus Exam       Right Left   Disc Pink and sharp, mild PPA Pink and sharp, mild temporal Pallor   C/D Ratio 0.3 0.4   Macula Flat, Good foveal reflex, RPE mottling, no heme or edema Good foveal reflex, persistent cystic changes, IRH/CWS superior and temporal macula -- improved, +ERM SN macula   Vessels Vascular attenuation, Copper wiring, Tortuous Vascular attenuation, Tortuous, flame hemes along ST arcades resolved--BRVO/BRAO, early sclerosis ST arcades   Periphery Attached, no heme Attached, IRH superior midzone (extension of ST BRVO)-- improved           IMAGING AND PROCEDURES  Imaging and Procedures for 11/21/2022  OCT, Retina - OU - Both Eyes       Right Eye Quality was  good. Central Foveal Thickness: 265. Progression has been stable. Findings include normal foveal contour, no IRF, no SRF, retinal drusen .   Left Eye Quality was good. Central Foveal Thickness: 338. Progression has been stable. Findings include no IRF, no SRF, abnormal foveal contour, intraretinal hyper-reflective material, epiretinal membrane, inner retinal atrophy (Persistent IRF superior and  temporal macula and fovea, persistent ERM with blunting of foveal contour, partial PVD).   Notes *Images captured and stored on drive  Diagnosis / Impression:  OD: NFP, no IRF/SRF OS: Persistent IRF superior and temporal macula and fovea, persistent ERM with blunting of foveal contour, partial PVD  Clinical management:  See below  Abbreviations: NFP - Normal foveal profile. CME - cystoid macular edema. PED - pigment epithelial detachment. IRF - intraretinal fluid. SRF - subretinal fluid. EZ - ellipsoid zone. ERM - epiretinal membrane. ORA - outer retinal atrophy. ORT - outer retinal tubulation. SRHM - subretinal hyper-reflective material. IRHM - intraretinal hyper-reflective material      Intravitreal Injection, Pharmacologic Agent - OS - Left Eye       Time Out 11/21/2022. 10:03 AM. Confirmed correct patient, procedure, site, and patient consented.   Anesthesia Topical anesthesia was used. Anesthetic medications included Lidocaine 2%, Proparacaine 0.5%.   Procedure Preparation included 5% betadine to ocular surface, eyelid speculum. A (32g) needle was used.   Injection: 2 mg aflibercept 2 MG/0.05ML   Route: Intravitreal, Site: Left Eye   NDC: A3590391, Lot: [id number], Expiration date: 02/28/2024, Waste: 0 mL   Post-op Post injection exam found visual acuity of at least counting fingers. The patient tolerated the procedure well. There were no complications. The patient received written and verbal post procedure care education. Post injection medications were not given.             ASSESSMENT/PLAN:    ICD-10-CM   1. Branch retinal vein occlusion of left eye with macular edema  H34.8320 OCT, Retina - OU - Both Eyes    Intravitreal Injection, Pharmacologic Agent - OS - Left Eye    aflibercept (EYLEA) SOLN 2 mg    CANCELED: Intravitreal Injection, Pharmacologic Agent - OS - Left Eye    2. Essential hypertension  I10     3. Hypertensive retinopathy of both eyes  H35.033     4. Epiretinal membrane (ERM) of left eye  H35.372     5. Pseudophakia of both eyes  Z96.1      1. BRVO w/ CME OS - s/p IVA OS #1 (9.21.22), #2 (10.19.22), #3 (11.16.22), #4 (12.28.22), #5 (02.22.23), #6 (06.07.23), #7 (07.26.23), #8 (09.13.23), #9 (10.18.23) - pt was first seen on 8.10.22, but could not be treated with intravitreal injection because BP was extremely high R arm (199/122) and L arm (186/110) - pt was referred back to PCP, who has since restarted BP meds - BCVA OS 20/40 stable, but was 20/25 on 7.26.23 - OCT shows Persistent IRF superior and temporal macula and fovea, persistent ERM with blunting of foveal contour at 5 weeks **history of increase in IRF at 8 weeks, noted on 04.19.23 OCT** **history of increase in IRF at 7 weeks, noted on 09.13.23 OCT** - discussed IVA resistance and possible benefit of switching medication - recommend switch to IVE OS #1 today, 11.22.23 -- for persistent edema - pt wishes to proceed with injection of IVE - RBA of procedure discussed, questions answered - IVA informed consent obtained and re-signed, 06.07.23 (OS) - IVE informed consent obtained and signed, 11.22.23 (OS) - see procedure note  - f/u in 5 weeks, DFE/OCT, possible injection  2,3. Hypertensive retinopathy OU  - BPs at presentation (8.10.22) were extremely high -- R arm (199/122) and L arm (186/110)  - pt was referred back to PCP, who has since restarted BP meds - discussed importance of tight BP control.  4. Epiretinal membrane, left  eye  - mild ERM - BCVA 20/40 -  asymptomatic, no metamorphopsia - no indication for surgery at this time - monitor  5. Pseudophakia OU  - s/p CE/IOL OU ([place]. [place])  - IOLs in good position, doing well  - continue to monitor  Ophthalmic Meds Ordered this visit:  Meds ordered this encounter  Medications   aflibercept (EYLEA) SOLN 2 mg     Return for f/u 5-6 weeks, BRVO OS, DFE, OCT.  There are no Patient Instructions on file for this visit.  This document serves as a record of services personally performed by Gardiner Sleeper, MD, PhD. It was created on their behalf by Orvan Falconer, an ophthalmic technician. The creation of this record is the provider's dictation and/or activities during the visit.    Electronically signed by: Orvan Falconer, OA, 11/21/22  8:41 PM  This document serves as a record of services personally performed by Gardiner Sleeper, MD, PhD. It was created on their behalf by [place] Jetty. Owens Shark, OA an ophthalmic technician. The creation of this record is the provider's dictation and/or activities during the visit.    Electronically signed by: San Jetty. Owens Shark, [place] 11.22.2023 8:41 PM  Gardiner Sleeper, M.D., Ph.D. Diseases & Surgery of the Retina and Vitreous [facility]  I have reviewed the above documentation for accuracy and completeness, and I agree with the above. Gardiner Sleeper, M.D., Ph.D. 11/21/22 8:41 PM  Abbreviations: M myopia (nearsighted); A astigmatism; H hyperopia (farsighted); P presbyopia; Mrx spectacle prescription;  CTL contact lenses; OD right eye; OS left eye; OU both eyes  XT exotropia; ET esotropia; PEK punctate epithelial keratitis; PEE punctate epithelial erosions; DES dry eye syndrome; MGD meibomian gland dysfunction; ATs artificial tears; PFAT's preservative free artificial tears; [place] nuclear sclerotic cataract; PSC posterior subcapsular cataract; ERM epi-retinal membrane; PVD posterior vitreous detachment; RD retinal detachment; DM diabetes mellitus;  DR diabetic retinopathy; NPDR non-proliferative diabetic retinopathy; PDR proliferative diabetic retinopathy; CSME clinically significant macular edema; DME diabetic macular edema; dbh dot blot hemorrhages; CWS cotton wool spot; POAG primary open angle glaucoma; C/D cup-to-disc ratio; HVF humphrey visual field; GVF goldmann visual field; OCT optical coherence tomography; IOP intraocular pressure; BRVO Branch retinal vein occlusion; CRVO central retinal vein occlusion; CRAO central retinal artery occlusion; BRAO branch retinal artery occlusion; RT retinal tear; SB scleral buckle; PPV pars plana vitrectomy; VH Vitreous hemorrhage; PRP panretinal laser photocoagulation; IVK intravitreal kenalog; VMT vitreomacular traction; MH Macular hole;  NVD neovascularization of the disc; NVE neovascularization elsewhere; AREDS age related eye disease study; ARMD age related macular degeneration; POAG primary open angle glaucoma; EBMD epithelial/anterior basement membrane dystrophy; ACIOL anterior chamber intraocular lens; IOL intraocular lens; PCIOL posterior chamber intraocular lens; Phaco/IOL phacoemulsification with intraocular lens placement; [place] photorefractive keratectomy; LASIK laser assisted in situ keratomileusis; HTN hypertension; DM diabetes mellitus; COPD chronic obstructive pulmonary disease

## 2022-11-21 ENCOUNTER — Encounter (INDEPENDENT_AMBULATORY_CARE_PROVIDER_SITE_OTHER): Payer: Self-pay | Admitting: Ophthalmology

## 2022-11-21 ENCOUNTER — Ambulatory Visit (INDEPENDENT_AMBULATORY_CARE_PROVIDER_SITE_OTHER): Payer: Medicare Other | Admitting: Ophthalmology

## 2022-11-21 DIAGNOSIS — I10 Essential (primary) hypertension: Secondary | ICD-10-CM | POA: Diagnosis not present

## 2022-11-21 DIAGNOSIS — H34832 Tributary (branch) retinal vein occlusion, left eye, with macular edema: Secondary | ICD-10-CM

## 2022-11-21 DIAGNOSIS — H35033 Hypertensive retinopathy, bilateral: Secondary | ICD-10-CM

## 2022-11-21 DIAGNOSIS — Z961 Presence of intraocular lens: Secondary | ICD-10-CM

## 2022-11-21 DIAGNOSIS — H35372 Puckering of macula, left eye: Secondary | ICD-10-CM | POA: Diagnosis not present

## 2022-11-21 MED ORDER — AFLIBERCEPT 2MG/0.05ML IZ SOLN FOR KALEIDOSCOPE
2.0000 mg | INTRAVITREAL | Status: AC | PRN
  Administered 2022-11-21: 2 mg via INTRAVITREAL

## 2023-01-01 NOTE — Progress Notes (Signed)
[facility] - Clinic Note  01/02/2023    CHIEF COMPLAINT Patient presents for Retina Follow Up  HISTORY OF PRESENT ILLNESS: Roger Howe is a 67 y.o. male who presents to the clinic today for:   HPI     Retina Follow Up   This started 6 weeks ago.  Duration of 6 weeks.  I, the attending physician,  performed the HPI with the patient and updated documentation appropriately.        Comments   6 week f/u BRVO and I'VE OS pt is reporting that his vision seems to improved some       Last edited by [name], MD on 01/05/2023  1:29 AM.    Pt states vision OD is "very good", left eye is still not as good, no problems after first Eylea injection at last visit  Referring physician: Salli Real, MD [address] [place],  [place] [address]  HISTORICAL INFORMATION:  Selected notes from the MEDICAL RECORD NUMBER Referred by Dr. [name] for eval of BRVO OS   CURRENT MEDICATIONS: No current outpatient medications on file. (Ophthalmic Drugs)   No current facility-administered medications for this visit. (Ophthalmic Drugs)   Current Outpatient Medications (Other)  Medication Sig   aspirin 81 MG chewable tablet Chew 81 mg by mouth daily.   atorvastatin (LIPITOR) 20 MG tablet Take 20 mg by mouth daily at 6 PM.   carvedilol (COREG) 3.125 MG tablet TAKE 1 TABLET BY MOUTH TWICE A DAY *PLS CALL OFFICE FOR ADDITIONAL REFILLS* (Patient not taking: Reported on 11/15/2021)   fenofibrate micronized (LOFIBRA) 134 MG capsule Take 134 mg by mouth daily.   lisinopril-hydrochlorothiazide (ZESTORETIC) 10-12.5 MG tablet Take 1 tablet by mouth daily.   metoprolol succinate (TOPROL-XL) 25 MG 24 hr tablet Take 25 mg by mouth daily.   No current facility-administered medications for this visit. (Other)   REVIEW OF SYSTEMS: ROS   Positive for: Eyes Last edited by [name], MD on 01/02/2023  1:02 PM.     ALLERGIES No Known Allergies  PAST MEDICAL HISTORY Past Medical History:   Diagnosis Date   Abdominal aortic atherosclerosis (HCC)    Seen on catheterization   CAD, multiple vessel 05/18/2014   Coronary artery disease    Essential hypertension    Hypertensive retinopathy    NSTEMI (non-ST elevated myocardial infarction) (HCC) 05/17/2014   Post CABG Echo 09/19/2014: Normal EF 55-60% with mild Conc LVH. 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   LIMA to LAD, SVG to D1, Sequential SVG to PDA and RPL2, EVH via right thigh and leg   Past Surgical History:  Procedure Laterality Date   CARDIAC CATHETERIZATION  05/17/2014   DR HARDING: 95% proximal LAD, 90% ramus intermedius, 95% mid circumflex after OM1, neither percent RPDA   CORONARY ARTERY BYPASS GRAFT N/A 05/20/2014   Procedure: CORONARY ARTERY BYPASS GRAFTING (CABG) x 4,  LIMA-MID LAD,  SVG-DIAGONAL,  SEQUENTIAL SVG -PDA, PLB #2;  Surgeon: [name], MD;  Location: [facility] OR;  Service: Open Heart Surgery;  Laterality: N/A;   INTRAOPERATIVE TRANSESOPHAGEAL ECHOCARDIOGRAM N/A 05/20/2014   Procedure: INTRAOPERATIVE TRANSESOPHAGEAL ECHOCARDIOGRAM;  Surgeon: [name], MD;  Location: [facility] OR;  Service: Open Heart Surgery;  Laterality: N/A;   LEFT HEART CATHETERIZATION WITH CORONARY ANGIOGRAM N/A 05/18/2014   Procedure: LEFT HEART CATHETERIZATION WITH CORONARY ANGIOGRAM;  Surgeon: [name], MD;  Location: [facility] CATH LAB;  Service: Cardiovascular;  Laterality: N/A;  TRANSTHORACIC ECHOCARDIOGRAM  09/14/2014   Normal EF 55-60% with mild Conc LVH. No regional WMA, essentially normal   FAMILY HISTORY Family History  Problem Relation Age of Onset   Heart disease Brother        lives in [place]   Heart attack Neg Hx    Stroke Neg Hx    SOCIAL HISTORY Social History   Tobacco Use   Smoking status: Never   Smokeless tobacco: Never  Substance Use Topics   Alcohol use: Never   Drug use: Never       OPHTHALMIC EXAM: Base Eye Exam     Visual Acuity (Snellen - Linear)       Right Left    Dist [place] 20/30 20/40 -1   Dist ph [place] NI NI         Tonometry (Tonopen, 9:07 AM)       Right Left   Pressure 17 18         Pupils       Pupils Dark Light Shape React APD   Right PERRL 4 3 Round Brisk None   Left PERRL 4 3 Round Brisk None         Visual Fields       Left Right    Full Full         Extraocular Movement       Right Left    Full, Ortho Full, Ortho         Neuro/Psych     Oriented x3: Yes   Mood/Affect: Normal         Dilation     Both eyes: 2.5% Phenylephrine @ 9:07 AM           Slit Lamp and Fundus Exam     Slit Lamp Exam       Right Left   Lids/Lashes Dermatochalasis - upper lid Dermatochalasis - upper lid   Conjunctiva/Sclera White and quiet Mild melanosis   Cornea Arcus, Well healed cataract wound Arcus, Well healed cataract wound   Anterior Chamber Deep and quiet Deep and quiet   Iris Round and dilated round and poorly dilated   Lens PCIOL in good position PCIOL in good position   Anterior Vitreous Vitreous syneresis, condensations Vitreous syneresis         Fundus Exam       Right Left   Disc mild pallor, sharp rim, mild PPA Sharp rim, mild temporal Pallor   C/D Ratio 0.3 0.4   Macula Flat, Good foveal reflex, RPE mottling, no heme or edema Good foveal reflex, interval improvement in cystic changes, IRH/CWS superior and temporal macula -- improved, +ERM SN macula   Vessels Vascular attenuation, Copper wiring, Tortuous Vascular attenuation, Tortuous, +sclerosis ST arcades   Periphery Attached, no heme Attached, IRH superior midzone (extension of ST BRVO)-- improved           IMAGING AND PROCEDURES  Imaging and Procedures for 01/02/2023  OCT, Retina - OU - Both Eyes       Right Eye Quality was good. Central Foveal Thickness: 271. Progression has been stable. Findings include normal foveal contour, no IRF, no SRF, retinal drusen .   Left Eye Quality was good. Central Foveal Thickness: 333. Progression has  improved. Findings include no IRF, no SRF, abnormal foveal contour, intraretinal hyper-reflective material, epiretinal membrane, inner retinal atrophy (Mild interval improvement in IRF superior and temporal macula and fovea, persistent ERM with blunting of foveal contour, partial PVD).   Notes *  Images captured and stored on drive  Diagnosis / Impression:  OD: NFP, no IRF/SRF OS: Mild interval improvement in IRF superior and temporal macula and fovea, persistent ERM with blunting of foveal contour, partial PVD  Clinical management:  See below  Abbreviations: NFP - Normal foveal profile. CME - cystoid macular edema. PED - pigment epithelial detachment. IRF - intraretinal fluid. SRF - subretinal fluid. EZ - ellipsoid zone. ERM - epiretinal membrane. ORA - outer retinal atrophy. ORT - outer retinal tubulation. SRHM - subretinal hyper-reflective material. IRHM - intraretinal hyper-reflective material      Intravitreal Injection, Pharmacologic Agent - OS - Left Eye       Time Out 01/02/2023. 9:55 AM. Confirmed correct patient, procedure, site, and patient consented.   Anesthesia Topical anesthesia was used. Anesthetic medications included Lidocaine 2%, Proparacaine 0.5%.   Procedure Preparation included 5% betadine to ocular surface, eyelid speculum. A (32g) needle was used.   Injection: 2 mg aflibercept 2 MG/0.05ML   Route: Intravitreal, Site: Left Eye   NDC: [id number], Lot: [id number], Expiration date: 02/28/2024, Waste: 0 mL   Post-op Post injection exam found visual acuity of at least counting fingers. The patient tolerated the procedure well. There were no complications. The patient received written and verbal post procedure care education. Post injection medications were not given.            ASSESSMENT/PLAN:    ICD-10-CM   1. Branch retinal vein occlusion of left eye with macular edema  H34.8320 OCT, Retina - OU - Both Eyes    Intravitreal Injection, Pharmacologic Agent  - OS - Left Eye    aflibercept (EYLEA) SOLN 2 mg    2. Essential hypertension  I10     3. Hypertensive retinopathy of both eyes  H35.033     4. Epiretinal membrane (ERM) of left eye  H35.372     5. Pseudophakia of both eyes  Z96.1      1. BRVO w/ CME OS - s/p IVA OS #1 (9.21.22), #2 (10.19.22), #3 (11.16.22), #4 (12.28.22), #5 (02.22.23), #6 (06.07.23), #7 (07.26.23), #8 (09.13.23), #9 (10.18.23) -- IVA resistance - s/p I'VE OS #1 (11.22.23) - pt was first seen on 8.10.22, but could not be treated with intravitreal injection because BP was extremely high R arm (199/122) and L arm (186/110) - pt was referred back to PCP, who has since restarted BP meds - BCVA OS 20/40 stable, but was 20/25 on 7.26.23 - OCT shows Mild interval improvement in IRF superior and temporal macula and fovea, persistent ERM with blunting of foveal contour, partial PVD at 6 weeks **history of increase in IRF at 8 weeks, noted on 04.19.23 OCT** **history of increase in IRF at 7 weeks, noted on 09.13.23 OCT** - recommend IVE OS #2 today, 1.02.24 -- for persistent edema - pt wishes to proceed with injection of IVE - RBA of procedure discussed, questions answered - IVA informed consent obtained and re-signed, 06.07.23 (OS) - IVE informed consent obtained and signed, 11.22.23 (OS) - see procedure note  - f/u in 6 weeks, DFE/OCT, possible injection  2,3. Hypertensive retinopathy OU  - BPs at presentation (8.10.22) were extremely high -- R arm (199/122) and L arm (186/110)  - pt was referred back to PCP, who has since restarted BP meds - discussed importance of tight BP control.  4. Epiretinal membrane, left eye  - mild ERM - BCVA 20/40 - asymptomatic, no metamorphopsia - no indication for surgery at this time - monitor  5.  Pseudophakia OU  - s/p CE/IOL OU (S. Groat)  - IOLs in good position, doing well  - continue to monitor  Ophthalmic Meds Ordered this visit:  Meds ordered this encounter  Medications    aflibercept (EYLEA) SOLN 2 mg     Return in about 6 weeks (around 02/13/2023) for f/u BRVO OS, DFE, OCT.  There are no Patient Instructions on file for this visit.  This document serves as a record of services personally performed by Karie Chimera, MD, PhD. It was created on their behalf by De Blanch, an ophthalmic technician. The creation of this record is the provider's dictation and/or activities during the visit.    Electronically signed by: De Blanch, OA, 01/05/23  1:29 AM  This document serves as a record of services personally performed by Karie Chimera, MD, PhD. It was created on their behalf by Glee Arvin. Manson Passey, OA an ophthalmic technician. The creation of this record is the provider's dictation and/or activities during the visit.    Electronically signed by: Glee Arvin. Manson Passey, [place] 01.03.2024 1:29 AM  Karie Chimera, M.D., Ph.D. Diseases & Surgery of the Retina and Vitreous [facility]  I have reviewed the above documentation for accuracy and completeness, and I agree with the above. Karie Chimera, M.D., Ph.D. 01/05/23 1:33 AM  Abbreviations: M myopia (nearsighted); A astigmatism; H hyperopia (farsighted); P presbyopia; Mrx spectacle prescription;  CTL contact lenses; OD right eye; OS left eye; OU both eyes  XT exotropia; ET esotropia; PEK punctate epithelial keratitis; PEE punctate epithelial erosions; DES dry eye syndrome; MGD meibomian gland dysfunction; ATs artificial tears; PFAT's preservative free artificial tears; NSC nuclear sclerotic cataract; PSC posterior subcapsular cataract; ERM epi-retinal membrane; PVD posterior vitreous detachment; RD retinal detachment; DM diabetes mellitus; DR diabetic retinopathy; NPDR non-proliferative diabetic retinopathy; PDR proliferative diabetic retinopathy; CSME clinically significant macular edema; DME diabetic macular edema; dbh dot blot hemorrhages; CWS cotton wool spot; POAG primary open angle  glaucoma; C/D cup-to-disc ratio; HVF humphrey visual field; GVF goldmann visual field; OCT optical coherence tomography; IOP intraocular pressure; BRVO Branch retinal vein occlusion; CRVO central retinal vein occlusion; CRAO central retinal artery occlusion; BRAO branch retinal artery occlusion; RT retinal tear; SB scleral buckle; PPV pars plana vitrectomy; VH Vitreous hemorrhage; PRP panretinal laser photocoagulation; IVK intravitreal kenalog; VMT vitreomacular traction; MH Macular hole;  NVD neovascularization of the disc; NVE neovascularization elsewhere; AREDS age related eye disease study; ARMD age related macular degeneration; POAG primary open angle glaucoma; EBMD epithelial/anterior basement membrane dystrophy; ACIOL anterior chamber intraocular lens; IOL intraocular lens; PCIOL posterior chamber intraocular lens; Phaco/IOL phacoemulsification with intraocular lens placement; PRK photorefractive keratectomy; LASIK laser assisted in situ keratomileusis; HTN hypertension; DM diabetes mellitus; COPD chronic obstructive pulmonary disease

## 2023-01-02 ENCOUNTER — Ambulatory Visit (INDEPENDENT_AMBULATORY_CARE_PROVIDER_SITE_OTHER): Payer: Medicare Other | Admitting: Ophthalmology

## 2023-01-02 ENCOUNTER — Encounter (INDEPENDENT_AMBULATORY_CARE_PROVIDER_SITE_OTHER): Payer: Self-pay | Admitting: Ophthalmology

## 2023-01-02 DIAGNOSIS — Z961 Presence of intraocular lens: Secondary | ICD-10-CM

## 2023-01-02 DIAGNOSIS — H34832 Tributary (branch) retinal vein occlusion, left eye, with macular edema: Secondary | ICD-10-CM

## 2023-01-02 DIAGNOSIS — H35372 Puckering of macula, left eye: Secondary | ICD-10-CM | POA: Diagnosis not present

## 2023-01-02 DIAGNOSIS — H35033 Hypertensive retinopathy, bilateral: Secondary | ICD-10-CM

## 2023-01-02 DIAGNOSIS — I10 Essential (primary) hypertension: Secondary | ICD-10-CM | POA: Diagnosis not present

## 2023-01-02 MED ORDER — AFLIBERCEPT 2MG/0.05ML IZ SOLN FOR KALEIDOSCOPE
2.0000 mg | INTRAVITREAL | Status: AC | PRN
  Administered 2023-01-02: 2 mg via INTRAVITREAL

## 2023-02-07 NOTE — Progress Notes (Signed)
[facility] Note  02/13/2023    CHIEF COMPLAINT Patient presents for Retina Follow Up  HISTORY OF PRESENT ILLNESS: Roger Howe is a 67 y.o. male who presents to the clinic today for:   HPI     Retina Follow Up   This started years ago.  Duration of 6 weeks.  I, the attending physician,  performed the HPI with the patient and updated documentation appropriately.        Comments   Patient feels that the vision is improving. He is not using any eye drops at this time.       Last edited by Bernarda Caffey, MD on 02/13/2023 12:37 PM.      Referring physician: Sandi Mariscal, MD [place],  [place] [address]  HISTORICAL INFORMATION:  Selected notes from the MEDICAL RECORD NUMBER Referred by Dr. Katy Fitch for eval of BRVO OS   CURRENT MEDICATIONS: No current outpatient medications on file. (Ophthalmic Drugs)   No current facility-administered medications for this visit. (Ophthalmic Drugs)   Current Outpatient Medications (Other)  Medication Sig   aspirin 81 MG chewable tablet Chew 81 mg by mouth daily.   atorvastatin (LIPITOR) 20 MG tablet Take 20 mg by mouth daily at 6 PM.   carvedilol (COREG) 3.125 MG tablet TAKE 1 TABLET BY MOUTH TWICE A DAY *PLS CALL OFFICE FOR ADDITIONAL REFILLS* (Patient not taking: Reported on 11/15/2021)   fenofibrate micronized (LOFIBRA) 134 MG capsule Take 134 mg by mouth daily.   lisinopril-hydrochlorothiazide (ZESTORETIC) 10-12.5 MG tablet Take 1 tablet by mouth daily.   metoprolol succinate (TOPROL-XL) 25 MG 24 hr tablet Take 25 mg by mouth daily.   No current facility-administered medications for this visit. (Other)   REVIEW OF SYSTEMS: ROS   Positive for: Cardiovascular, Eyes Negative for: Constitutional, Gastrointestinal, Neurological, Skin, Genitourinary, Musculoskeletal, HENT, Endocrine, Respiratory, Psychiatric, Allergic/Imm, Heme/Lymph Last edited by Annie Paras, COT on 02/13/2023  8:54 AM.      ALLERGIES No Known Allergies  PAST MEDICAL HISTORY Past Medical History:  Diagnosis Date   Abdominal aortic atherosclerosis ([place])    Seen on catheterization   CAD, multiple vessel 05/18/2014   Coronary artery disease    Essential hypertension    Hypertensive retinopathy    NSTEMI (non-ST elevated myocardial infarction) ([place]) 05/17/2014   Post CABG Echo 09/19/2014: Normal EF 55-60% with mild Conc LVH. 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   LIMA to LAD, SVG to D1, Sequential SVG to PDA and RPL2, EVH via right thigh and leg   Past Surgical History:  Procedure Laterality Date   CARDIAC CATHETERIZATION  05/17/2014   DR HARDING: 95% proximal LAD, 90% ramus intermedius, 95% mid circumflex after OM1, neither percent RPDA   CORONARY ARTERY BYPASS GRAFT N/A 05/20/2014   Procedure: CORONARY ARTERY BYPASS GRAFTING (CABG) x 4,  LIMA-MID LAD,  SVG-DIAGONAL,  SEQUENTIAL SVG -PDA, PLB #2;  Surgeon: Rexene Alberts, MD;  Location: [facility];  Service: Open Heart Surgery;  Laterality: N/A;   INTRAOPERATIVE TRANSESOPHAGEAL ECHOCARDIOGRAM N/A 05/20/2014   Procedure: INTRAOPERATIVE TRANSESOPHAGEAL ECHOCARDIOGRAM;  Surgeon: Rexene Alberts, MD;  Location: [facility];  Service: Open Heart Surgery;  Laterality: N/A;   LEFT HEART CATHETERIZATION WITH CORONARY ANGIOGRAM N/A 05/18/2014   Procedure: LEFT HEART CATHETERIZATION WITH CORONARY ANGIOGRAM;  Surgeon: Leonie Man, MD;  Location: [facility] CATH LAB;  Service: Cardiovascular;  Laterality: N/A;   TRANSTHORACIC ECHOCARDIOGRAM  09/14/2014   Normal  EF 55-60% with mild Conc LVH. No regional WMA, essentially normal   FAMILY HISTORY Family History  Problem Relation Age of Onset   Heart disease Brother        lives in [place]   Heart attack Neg Hx    Stroke Neg Hx    SOCIAL HISTORY Social History   Tobacco Use   Smoking status: Never   Smokeless tobacco: Never  Substance Use Topics   Alcohol use: Never   Drug use: Never       OPHTHALMIC  EXAM: Base Eye Exam     Visual Acuity (Snellen - Linear)       Right Left   Dist [place] 20/30 20/40   Dist ph [place] NI NI         Tonometry (Tonopen, 8:57 AM)       Right Left   Pressure 18 18         Pupils       Dark Light Shape React APD   Right 4 3 Round Minimal None   Left 4 3 Round Minimal None         Visual Fields       Left Right    Full Full         Extraocular Movement       Right Left    Full, Ortho Full, Ortho         Neuro/Psych     Oriented x3: Yes   Mood/Affect: Normal         Dilation     Both eyes: 1.0% Mydriacyl, 2.5% Phenylephrine @ 8:55 AM           Slit Lamp and Fundus Exam     Slit Lamp Exam       Right Left   Lids/Lashes Dermatochalasis - upper lid Dermatochalasis - upper lid   Conjunctiva/Sclera White and quiet Mild melanosis   Cornea Arcus, Well healed cataract wound Arcus, Well healed cataract wound   Anterior Chamber Deep and quiet Deep and quiet   Iris Round and dilated round and poorly dilated   Lens PCIOL in good position PCIOL in good position   Anterior Vitreous Vitreous syneresis, PVD, condensations Vitreous syneresis         Fundus Exam       Right Left   Disc mild pallor, sharp rim, mild PPA Sharp rim, mild temporal Pallor   C/D Ratio 0.3 0.4   Macula Flat, Good foveal reflex, RPE mottling, no heme or edema Good foveal reflex, persistent cystic changes, IRH/CWS superior and temporal macula -- improved, +ERM SN macula   Vessels Vascular attenuation, Copper wiring, Tortuous Vascular attenuation, Tortuous, +sclerosis ST arcades   Periphery Attached, no heme Attached, IRH superior midzone (extension of ST BRVO)-- improved           IMAGING AND PROCEDURES  Imaging and Procedures for 02/13/2023  OCT, Retina - OU - Both Eyes       Right Eye Quality was good. Central Foveal Thickness: 269. Progression has been stable. Findings include normal foveal contour, no IRF, no SRF, retinal drusen .   Left  Eye Quality was good. Central Foveal Thickness: 335. Progression has worsened. Findings include no SRF, abnormal foveal contour, intraretinal hyper-reflective material, cystoid macular edema, epiretinal membrane, intraretinal fluid, inner retinal atrophy ( interval increase in IRF superior and temporal macula and fovea, persistent ERM with blunting of foveal contour, partial PVD).   Notes *Images captured and stored on drive  Diagnosis / Impression:  OD: NFP, no IRF/SRF OS: interval increase in IRF superior and temporal macula and fovea, persistent ERM with blunting of foveal contour, partial PVD  Clinical management:  See below  Abbreviations: NFP - Normal foveal profile. CME - cystoid macular edema. PED - pigment epithelial detachment. IRF - intraretinal fluid. SRF - subretinal fluid. EZ - ellipsoid zone. ERM - epiretinal membrane. ORA - outer retinal atrophy. ORT - outer retinal tubulation. SRHM - subretinal hyper-reflective material. IRHM - intraretinal hyper-reflective material      Intravitreal Injection, Pharmacologic Agent - OS - Left Eye       Time Out 02/13/2023. 9:34 AM. Confirmed correct patient, procedure, site, and patient consented.   Anesthesia Topical anesthesia was used. Anesthetic medications included Lidocaine 2%, Proparacaine 0.5%.   Procedure Preparation included 5% betadine to ocular surface, eyelid speculum. A (32g) needle was used.   Injection: 2 mg aflibercept 2 MG/0.05ML   Route: Intravitreal, Site: Left Eye   NDC: [id number], Lot: [id number], Expiration date: 02/28/2024, Waste: 0 mL   Post-op Post injection exam found visual acuity of at least counting fingers. The patient tolerated the procedure well. There were no complications. The patient received written and verbal post procedure care education. Post injection medications were not given.            ASSESSMENT/PLAN:    ICD-10-CM   1. Branch retinal vein occlusion of left eye with macular  edema  H34.8320 OCT, Retina - OU - Both Eyes    Intravitreal Injection, Pharmacologic Agent - OS - Left Eye    aflibercept (EYLEA) SOLN 2 mg    2. Essential hypertension  I10     3. Hypertensive retinopathy of both eyes  H35.033     4. Epiretinal membrane (ERM) of left eye  H35.372     5. Pseudophakia of both eyes  Z96.1       1. BRVO w/ CME OS - s/p IVA OS #1 (9.21.22), #2 (10.19.22), #3 (11.16.22), #4 (12.28.22), #5 (02.22.23), #6 (06.07.23), #7 (07.26.23), #8 (09.13.23), #9 (10.18.23) -- IVA resistance - s/p IVE OS #1 (11.22.23), #2 (01.02.24) - pt was first seen on 8.10.22, but could not be treated with intravitreal injection because BP was extremely high R arm (199/122) and L arm (186/110) - pt was referred back to PCP, who has since restarted BP meds - BCVA OS 20/40 stable, but was 20/25 on 7.26.23 - OCT shows interval increase in IRF superior and temporal macula and fovea at 6 weeks **history of increase in IRF at 8 weeks, noted on 04.19.23 OCT** **history of increase in IRF at 7 weeks, noted on 09.13.23 OCT** - recommend IVE OS #3 today, 02.14.24 w/ dec f/u interval to 5 wks - pt wishes to proceed with injection of IVE - RBA of procedure discussed, questions answered - IVA informed consent obtained and re-signed, 06.07.23 (OS) - IVE informed consent obtained and signed, 11.22.23 (OS) - see procedure note - f/u in 5 weeks, DFE/OCT, possible injection  2,3. Hypertensive retinopathy OU - BPs at presentation (8.10.22) were extremely high -- R arm (199/122) and L arm (186/110)  - pt was referred back to PCP, who has since restarted BP meds - discussed importance of tight BP control.  4. Epiretinal membrane, left eye  - mild ERM - BCVA 20/40 - asymptomatic, no metamorphopsia - no indication for surgery at this time - continue to monitor  5. Pseudophakia OU  - s/p CE/IOL OU ([place])  - IOLs  in good position, doing well  - continue to monitor  Ophthalmic Meds Ordered  this visit:  Meds ordered this encounter  Medications   aflibercept (EYLEA) SOLN 2 mg     Return in about 5 weeks (around 03/20/2023) for f/u BRVO OS , DFE, OCT, Possible, IVE, OS.  There are no Patient Instructions on file for this visit.  This document serves as a record of services personally performed by Gardiner Sleeper, MD, PhD. It was created on their behalf by Orvan Falconer, an ophthalmic technician. The creation of this record is the provider's dictation and/or activities during the visit.    Electronically signed by: Orvan Falconer, OA, 02/13/23  12:38 PM  This document serves as a record of services personally performed by Gardiner Sleeper, MD, PhD. It was created on their behalf by San Jetty. Owens Shark, OA an ophthalmic technician. The creation of this record is the provider's dictation and/or activities during the visit.    Electronically signed by: San Jetty. Owens Shark, [place] 02.14.2024 12:38 PM  This document serves as a record of services personally performed by Gardiner Sleeper, MD, PhD. It was created on their behalf by Renaldo Reel, [place] an ophthalmic technician. The creation of this record is the provider's dictation and/or activities during the visit.    Electronically signed by:  Renaldo Reel, COT  02.14.24 12:38 PM  Gardiner Sleeper, M.D., Ph.D. Diseases & Surgery of the Retina and Vitreous [facility]  I have reviewed the above documentation for accuracy and completeness, and I agree with the above. Gardiner Sleeper, M.D., Ph.D. 02/13/23 12:41 PM  Abbreviations: M myopia (nearsighted); A astigmatism; H hyperopia (farsighted); P presbyopia; Mrx spectacle prescription;  CTL contact lenses; OD right eye; OS left eye; OU both eyes  XT exotropia; ET esotropia; PEK punctate epithelial keratitis; PEE punctate epithelial erosions; DES dry eye syndrome; MGD meibomian gland dysfunction; ATs artificial tears; PFAT's preservative free artificial tears; [place]  nuclear sclerotic cataract; PSC posterior subcapsular cataract; ERM epi-retinal membrane; PVD posterior vitreous detachment; RD retinal detachment; DM diabetes mellitus; DR diabetic retinopathy; NPDR non-proliferative diabetic retinopathy; PDR proliferative diabetic retinopathy; CSME clinically significant macular edema; DME diabetic macular edema; dbh dot blot hemorrhages; CWS cotton wool spot; POAG primary open angle glaucoma; C/D cup-to-disc ratio; HVF humphrey visual field; GVF goldmann visual field; OCT optical coherence tomography; IOP intraocular pressure; BRVO Branch retinal vein occlusion; CRVO central retinal vein occlusion; CRAO central retinal artery occlusion; BRAO branch retinal artery occlusion; RT retinal tear; SB scleral buckle; PPV pars plana vitrectomy; VH Vitreous hemorrhage; PRP panretinal laser photocoagulation; IVK intravitreal kenalog; VMT vitreomacular traction; MH Macular hole;  NVD neovascularization of the disc; NVE neovascularization elsewhere; AREDS age related eye disease study; ARMD age related macular degeneration; POAG primary open angle glaucoma; EBMD epithelial/anterior basement membrane dystrophy; ACIOL anterior chamber intraocular lens; IOL intraocular lens; PCIOL posterior chamber intraocular lens; Phaco/IOL phacoemulsification with intraocular lens placement; [place] photorefractive keratectomy; LASIK laser assisted in situ keratomileusis; HTN hypertension; DM diabetes mellitus; COPD chronic obstructive pulmonary disease

## 2023-02-13 ENCOUNTER — Encounter (INDEPENDENT_AMBULATORY_CARE_PROVIDER_SITE_OTHER): Payer: Self-pay | Admitting: Ophthalmology

## 2023-02-13 ENCOUNTER — Ambulatory Visit (INDEPENDENT_AMBULATORY_CARE_PROVIDER_SITE_OTHER): Payer: Medicare Other | Admitting: Ophthalmology

## 2023-02-13 DIAGNOSIS — H35372 Puckering of macula, left eye: Secondary | ICD-10-CM

## 2023-02-13 DIAGNOSIS — H34832 Tributary (branch) retinal vein occlusion, left eye, with macular edema: Secondary | ICD-10-CM | POA: Diagnosis not present

## 2023-02-13 DIAGNOSIS — Z961 Presence of intraocular lens: Secondary | ICD-10-CM

## 2023-02-13 DIAGNOSIS — I10 Essential (primary) hypertension: Secondary | ICD-10-CM | POA: Diagnosis not present

## 2023-02-13 DIAGNOSIS — H35033 Hypertensive retinopathy, bilateral: Secondary | ICD-10-CM

## 2023-02-13 MED ORDER — AFLIBERCEPT 2MG/0.05ML IZ SOLN FOR KALEIDOSCOPE
2.0000 mg | INTRAVITREAL | Status: AC | PRN
  Administered 2023-02-13: 2 mg via INTRAVITREAL

## 2023-03-06 NOTE — Progress Notes (Signed)
[facility] Note  03/20/2023    CHIEF COMPLAINT Patient presents for Retina Follow Up  HISTORY OF PRESENT ILLNESS: Roger Howe is a 67 y.o. male who presents to the clinic today for:   HPI     Retina Follow Up   Patient presents with  CRVO/BRVO.  In left eye.  Severity is moderate.  Duration of 5 weeks.  Since onset it is stable.  I, the attending physician,  performed the HPI with the patient and updated documentation appropriately.        Comments   Pt here for 5 wk ret f/u BRVO OS. Pt states VA is stable but is experiencing 'stickiness' OU. Pt uses OTC gtts 2-3x daily.       Last edited by Bernarda Caffey, MD on 03/20/2023 12:10 PM.    Pt states vision has improved, eyes sometimes feel "sticky"   Referring physician: Sandi Mariscal, MD [place],  [place] [id number]  HISTORICAL INFORMATION:  Selected notes from the MEDICAL RECORD NUMBER Referred by Dr. Katy Fitch for eval of BRVO OS   CURRENT MEDICATIONS: No current outpatient medications on file. (Ophthalmic Drugs)   No current facility-administered medications for this visit. (Ophthalmic Drugs)   Current Outpatient Medications (Other)  Medication Sig   aspirin 81 MG chewable tablet Chew 81 mg by mouth daily.   atorvastatin (LIPITOR) 20 MG tablet Take 20 mg by mouth daily at 6 PM.   fenofibrate micronized (LOFIBRA) 134 MG capsule Take 134 mg by mouth daily.   lisinopril-hydrochlorothiazide (ZESTORETIC) 10-12.5 MG tablet Take 1 tablet by mouth daily.   metoprolol succinate (TOPROL-XL) 25 MG 24 hr tablet Take 25 mg by mouth daily.   carvedilol (COREG) 3.125 MG tablet TAKE 1 TABLET BY MOUTH TWICE A DAY *PLS CALL OFFICE FOR ADDITIONAL REFILLS* (Patient not taking: Reported on 11/15/2021)   No current facility-administered medications for this visit. (Other)   REVIEW OF SYSTEMS: ROS   Positive for: Cardiovascular, Eyes Negative for: Constitutional, Gastrointestinal, Neurological, Skin,  Genitourinary, Musculoskeletal, HENT, Endocrine, Respiratory, Psychiatric, Allergic/Imm, Heme/Lymph Last edited by Kingsley Spittle, COT on 03/20/2023  8:12 AM.     ALLERGIES No Known Allergies  PAST MEDICAL HISTORY Past Medical History:  Diagnosis Date   Abdominal aortic atherosclerosis ([place])    Seen on catheterization   CAD, multiple vessel 05/18/2014   Coronary artery disease    Essential hypertension    Hypertensive retinopathy    NSTEMI (non-ST elevated myocardial infarction) ([place]) 05/17/2014   Post CABG Echo 09/19/2014: Normal EF 55-60% with mild Conc LVH. 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   LIMA to LAD, SVG to D1, Sequential SVG to PDA and RPL2, EVH via right thigh and leg   Past Surgical History:  Procedure Laterality Date   CARDIAC CATHETERIZATION  05/17/2014   DR HARDING: 95% proximal LAD, 90% ramus intermedius, 95% mid circumflex after OM1, neither percent RPDA   CORONARY ARTERY BYPASS GRAFT N/A 05/20/2014   Procedure: CORONARY ARTERY BYPASS GRAFTING (CABG) x 4,  LIMA-MID LAD,  SVG-DIAGONAL,  SEQUENTIAL SVG -PDA, PLB #2;  Surgeon: Rexene Alberts, MD;  Location: [facility];  Service: Open Heart Surgery;  Laterality: N/A;   INTRAOPERATIVE TRANSESOPHAGEAL ECHOCARDIOGRAM N/A 05/20/2014   Procedure: INTRAOPERATIVE TRANSESOPHAGEAL ECHOCARDIOGRAM;  Surgeon: Rexene Alberts, MD;  Location: [facility];  Service: Open Heart Surgery;  Laterality: N/A;   LEFT HEART CATHETERIZATION WITH CORONARY ANGIOGRAM N/A 05/18/2014   Procedure:  LEFT HEART CATHETERIZATION WITH CORONARY ANGIOGRAM;  Surgeon: Leonie Man, MD;  Location: [facility] CATH LAB;  Service: Cardiovascular;  Laterality: N/A;   TRANSTHORACIC ECHOCARDIOGRAM  09/14/2014   Normal EF 55-60% with mild Conc LVH. No regional WMA, essentially normal   FAMILY HISTORY Family History  Problem Relation Age of Onset   Heart disease Brother        lives in [place]   Heart attack Neg Hx    Stroke Neg Hx    SOCIAL  HISTORY Social History   Tobacco Use   Smoking status: Never   Smokeless tobacco: Never  Substance Use Topics   Alcohol use: Never   Drug use: Never       OPHTHALMIC EXAM: Base Eye Exam     Visual Acuity (Snellen - Linear)       Right Left   Dist [place] 20/25 -2 20/30 -2   Dist ph  NI NI         Tonometry (Tonopen, 8:17 AM)       Right Left   Pressure 14 13         Pupils       Pupils Dark Light Shape React APD   Right PERRL 4 3 Round Brisk None   Left PERRL 4 3 Round Brisk None         Visual Fields (Counting fingers)       Left Right    Full Full         Extraocular Movement       Right Left    Full, Ortho Full, Ortho         Neuro/Psych     Oriented x3: Yes   Mood/Affect: Normal         Dilation     Both eyes: 1.0% Mydriacyl, 2.5% Phenylephrine @ 8:18 AM           Slit Lamp and Fundus Exam     Slit Lamp Exam       Right Left   Lids/Lashes Dermatochalasis - upper lid Dermatochalasis - upper lid   Conjunctiva/Sclera White and quiet Mild melanosis   Cornea Arcus, Well healed cataract wound Arcus, Well healed cataract wound   Anterior Chamber Deep and quiet Deep and quiet   Iris Round and dilated round and poorly dilated   Lens PCIOL in good position PCIOL in good position   Anterior Vitreous Vitreous syneresis, PVD, condensations Vitreous syneresis, Posterior vitreous detachment, vitreous condensations         Fundus Exam       Right Left   Disc mild pallor, sharp rim, mild PPA Sharp rim, mild temporal Pallor   C/D Ratio 0.3 0.4   Macula Flat, Good foveal reflex, RPE mottling, no heme or edema Good foveal reflex, persistent cystic changes -- improved, IRH/CWS superior and temporal macula -- improved, +ERM SN macula   Vessels Vascular attenuation, Copper wiring, Tortuous Vascular attenuation, Tortuous, +sclerosis ST arcades   Periphery Attached, no heme Attached, IRH superior midzone (extension of ST BRVO)-- improved            IMAGING AND PROCEDURES  Imaging and Procedures for 03/20/2023  OCT, Retina - OU - Both Eyes       Right Eye Quality was good. Central Foveal Thickness: 264. Progression has been stable. Findings include normal foveal contour, no IRF, no SRF, retinal drusen .   Left Eye Quality was good. Central Foveal Thickness: 336. Progression has improved. Findings include no SRF,  abnormal foveal contour, intraretinal hyper-reflective material, cystoid macular edema, epiretinal membrane, intraretinal fluid, inner retinal atrophy ( interval improvement in IRF superior and temporal macula --just trace cystic changes remain, persistent ERM with blunting of foveal contour, partial PVD).   Notes *Images captured and stored on drive  Diagnosis / Impression:  OD: NFP, no IRF/SRF OS: interval improvement in IRF superior and temporal macula --just trace cystic changes remain, persistent ERM with blunting of foveal contour, partial PVD  Clinical management:  See below  Abbreviations: NFP - Normal foveal profile. CME - cystoid macular edema. PED - pigment epithelial detachment. IRF - intraretinal fluid. SRF - subretinal fluid. EZ - ellipsoid zone. ERM - epiretinal membrane. ORA - outer retinal atrophy. ORT - outer retinal tubulation. SRHM - subretinal hyper-reflective material. IRHM - intraretinal hyper-reflective material      Intravitreal Injection, Pharmacologic Agent - OS - Left Eye       Time Out 03/20/2023. 9:37 AM. Confirmed correct patient, procedure, site, and patient consented.   Anesthesia Topical anesthesia was used. Anesthetic medications included Lidocaine 2%, Proparacaine 0.5%.   Procedure Preparation included 5% betadine to ocular surface, eyelid speculum. A (32g) needle was used.   Injection: 2 mg aflibercept 2 MG/0.05ML   Route: Intravitreal, Site: Left Eye   NDC: [id number], Lot: [id number], Expiration date: 04/29/2024, Waste: 0 mL   Post-op Post injection exam found  visual acuity of at least counting fingers. The patient tolerated the procedure well. There were no complications. The patient received written and verbal post procedure care education. Post injection medications were not given.            ASSESSMENT/PLAN:    ICD-10-CM   1. Branch retinal vein occlusion of left eye with macular edema  H34.8320 OCT, Retina - OU - Both Eyes    Intravitreal Injection, Pharmacologic Agent - OS - Left Eye    aflibercept (EYLEA) SOLN 2 mg    2. Essential hypertension  I10     3. Hypertensive retinopathy of both eyes  H35.033     4. Epiretinal membrane (ERM) of left eye  H35.372     5. Pseudophakia of both eyes  Z96.1      1. BRVO w/ CME OS - s/p IVA OS #1 (9.21.22), #2 (10.19.22), #3 (11.16.22), #4 (12.28.22), #5 (02.22.23), #6 (06.07.23), #7 (07.26.23), #8 (09.13.23), #9 (10.18.23) -- IVA resistance - s/p IVE OS #1 (11.22.23), #2 (01.02.24), #3 (02.14.24) - pt was first seen on 8.10.22, but could not be treated with intravitreal injection because BP was extremely high R arm (199/122) and L arm (186/110) - pt was referred back to PCP, who has since restarted BP meds - BCVA OS 20/40 stable, but was 20/25 on 7.26.23 - OCT shows interval improvement in IRF superior and temporal macula --just trace cystic changes remain at 5 weeks **history of increase in IRF at 8 weeks, noted on 04.19.23 OCT** **history of increase in IRF at 7 weeks, noted on 09.13.23 OCT** - recommend IVE OS #4 today, 03.20.24 w/ f/u at 5 wks - pt wishes to proceed with injection of IVE - RBA of procedure discussed, questions answered - IVA informed consent obtained and re-signed, 06.07.23 (OS) - IVE informed consent obtained and signed, 11.22.23 (OS) - see procedure note - f/u in 5 weeks, DFE/OCT, possible injection  2,3. Hypertensive retinopathy OU - BPs at presentation (8.10.22) were extremely high -- R arm (199/122) and L arm (186/110)  - pt was referred back to PCP, who has  since restarted BP meds - discussed importance of tight BP control.  4. Epiretinal membrane, left eye  - mild ERM - BCVA 20/40 - asymptomatic, no metamorphopsia - no indication for surgery at this time - continue to monitor  5. Pseudophakia OU  - s/p CE/IOL OU (S. [place])  - IOLs in good position, doing well  - continue to monitor  Ophthalmic Meds Ordered this visit:  Meds ordered this encounter  Medications   aflibercept (EYLEA) SOLN 2 mg     Return in about 5 weeks (around 04/24/2023) for f/u BRVO OS, DFE, OCT.  There are no Patient Instructions on file for this visit.  This document serves as a record of services personally performed by Gardiner Sleeper, MD, PhD. It was created on their behalf by Orvan Falconer, an ophthalmic technician. The creation of this record is the provider's dictation and/or activities during the visit.    Electronically signed by: Orvan Falconer, OA, 03/20/23  12:16 PM  This document serves as a record of services personally performed by Gardiner Sleeper, MD, PhD. It was created on their behalf by San Jetty. Owens Shark, OA an ophthalmic technician. The creation of this record is the provider's dictation and/or activities during the visit.    Electronically signed by: San Jetty. Owens Shark, [place] 03.20.2024 12:16 PM  Gardiner Sleeper, M.D., Ph.D. Diseases & Surgery of the Retina and Vitreous [facility]  I have reviewed the above documentation for accuracy and completeness, and I agree with the above. Gardiner Sleeper, M.D., Ph.D. 03/20/23 12:18 PM   Abbreviations: M myopia (nearsighted); A astigmatism; H hyperopia (farsighted); P presbyopia; Mrx spectacle prescription;  CTL contact lenses; OD right eye; OS left eye; OU both eyes  XT exotropia; ET esotropia; PEK punctate epithelial keratitis; PEE punctate epithelial erosions; DES dry eye syndrome; MGD meibomian gland dysfunction; ATs artificial tears; PFAT's preservative free artificial  tears; [place] nuclear sclerotic cataract; PSC posterior subcapsular cataract; ERM epi-retinal membrane; PVD posterior vitreous detachment; RD retinal detachment; DM diabetes mellitus; DR diabetic retinopathy; NPDR non-proliferative diabetic retinopathy; PDR proliferative diabetic retinopathy; CSME clinically significant macular edema; DME diabetic macular edema; dbh dot blot hemorrhages; CWS cotton wool spot; POAG primary open angle glaucoma; C/D cup-to-disc ratio; HVF humphrey visual field; GVF goldmann visual field; OCT optical coherence tomography; IOP intraocular pressure; BRVO Branch retinal vein occlusion; CRVO central retinal vein occlusion; CRAO central retinal artery occlusion; BRAO branch retinal artery occlusion; RT retinal tear; SB scleral buckle; PPV pars plana vitrectomy; VH Vitreous hemorrhage; PRP panretinal laser photocoagulation; IVK intravitreal kenalog; VMT vitreomacular traction; MH Macular hole;  NVD neovascularization of the disc; NVE neovascularization elsewhere; AREDS age related eye disease study; ARMD age related macular degeneration; POAG primary open angle glaucoma; EBMD epithelial/anterior basement membrane dystrophy; ACIOL anterior chamber intraocular lens; IOL intraocular lens; PCIOL posterior chamber intraocular lens; Phaco/IOL phacoemulsification with intraocular lens placement; [place] photorefractive keratectomy; LASIK laser assisted in situ keratomileusis; HTN hypertension; DM diabetes mellitus; COPD chronic obstructive pulmonary disease

## 2023-03-20 ENCOUNTER — Ambulatory Visit (INDEPENDENT_AMBULATORY_CARE_PROVIDER_SITE_OTHER): Payer: Medicare Other | Admitting: Ophthalmology

## 2023-03-20 ENCOUNTER — Encounter (INDEPENDENT_AMBULATORY_CARE_PROVIDER_SITE_OTHER): Payer: Self-pay | Admitting: Ophthalmology

## 2023-03-20 DIAGNOSIS — H35372 Puckering of macula, left eye: Secondary | ICD-10-CM | POA: Diagnosis not present

## 2023-03-20 DIAGNOSIS — H35033 Hypertensive retinopathy, bilateral: Secondary | ICD-10-CM | POA: Diagnosis not present

## 2023-03-20 DIAGNOSIS — I10 Essential (primary) hypertension: Secondary | ICD-10-CM | POA: Diagnosis not present

## 2023-03-20 DIAGNOSIS — Z961 Presence of intraocular lens: Secondary | ICD-10-CM

## 2023-03-20 DIAGNOSIS — H34832 Tributary (branch) retinal vein occlusion, left eye, with macular edema: Secondary | ICD-10-CM | POA: Diagnosis not present

## 2023-03-20 MED ORDER — AFLIBERCEPT 2MG/0.05ML IZ SOLN FOR KALEIDOSCOPE
2.0000 mg | INTRAVITREAL | Status: AC | PRN
  Administered 2023-03-20: 2 mg via INTRAVITREAL

## 2023-04-18 NOTE — Progress Notes (Signed)
[facility] - Clinic Note  04/24/2023    CHIEF COMPLAINT Patient presents for Retina Follow Up  HISTORY OF PRESENT ILLNESS: Roger Howe is a 67 y.o. male who presents to the clinic today for:   HPI     Retina Follow Up   Patient presents with  CRVO/BRVO.  In left eye.  Severity is moderate.  Duration of 5 weeks.  Since onset it is gradually improving.  I, the attending physician,  performed the HPI with the patient and updated documentation appropriately.        Comments   Pt here for 5 wk ret f/u BRVO OS. Pt states VA is a little bit better.       Last edited by [name], MD on 04/24/2023 11:43 AM.     Referring physician: Salli Real, MD [address] [place],  [place] [address]  HISTORICAL INFORMATION:  Selected notes from the MEDICAL RECORD NUMBER Referred by Dr. [name] for eval of BRVO OS   CURRENT MEDICATIONS: No current outpatient medications on file. (Ophthalmic Drugs)   No current facility-administered medications for this visit. (Ophthalmic Drugs)   Current Outpatient Medications (Other)  Medication Sig   aspirin 81 MG chewable tablet Chew 81 mg by mouth daily.   atorvastatin (LIPITOR) 20 MG tablet Take 20 mg by mouth daily at 6 PM.   carvedilol (COREG) 3.125 MG tablet TAKE 1 TABLET BY MOUTH TWICE A DAY *PLS CALL OFFICE FOR ADDITIONAL REFILLS*   fenofibrate micronized (LOFIBRA) 134 MG capsule Take 134 mg by mouth daily.   lisinopril-hydrochlorothiazide (ZESTORETIC) 10-12.5 MG tablet Take 1 tablet by mouth daily.   metoprolol succinate (TOPROL-XL) 25 MG 24 hr tablet Take 25 mg by mouth daily.   No current facility-administered medications for this visit. (Other)   REVIEW OF SYSTEMS: ROS   Positive for: Cardiovascular, Eyes Negative for: Constitutional, Gastrointestinal, Neurological, Skin, Genitourinary, Musculoskeletal, HENT, Endocrine, Respiratory, Psychiatric, Allergic/Imm, Heme/Lymph Last edited by Thompson Grayer, COT on  04/24/2023  8:19 AM.      ALLERGIES No Known Allergies  PAST MEDICAL HISTORY Past Medical History:  Diagnosis Date   Abdominal aortic atherosclerosis    Seen on catheterization   CAD, multiple vessel 05/18/2014   Coronary artery disease    Essential hypertension    Hypertensive retinopathy    NSTEMI (non-ST elevated myocardial infarction) 05/17/2014   Post CABG Echo 09/19/2014: Normal EF 55-60% with mild Conc LVH. 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   LIMA to LAD, SVG to D1, Sequential SVG to PDA and RPL2, EVH via right thigh and leg   Past Surgical History:  Procedure Laterality Date   CARDIAC CATHETERIZATION  05/17/2014   DR HARDING: 95% proximal LAD, 90% ramus intermedius, 95% mid circumflex after OM1, neither percent RPDA   CORONARY ARTERY BYPASS GRAFT N/A 05/20/2014   Procedure: CORONARY ARTERY BYPASS GRAFTING (CABG) x 4,  LIMA-MID LAD,  SVG-DIAGONAL,  SEQUENTIAL SVG -PDA, PLB #2;  Surgeon: [name], MD;  Location: [facility] OR;  Service: Open Heart Surgery;  Laterality: N/A;   INTRAOPERATIVE TRANSESOPHAGEAL ECHOCARDIOGRAM N/A 05/20/2014   Procedure: INTRAOPERATIVE TRANSESOPHAGEAL ECHOCARDIOGRAM;  Surgeon: [name], MD;  Location: [facility] OR;  Service: Open Heart Surgery;  Laterality: N/A;   LEFT HEART CATHETERIZATION WITH CORONARY ANGIOGRAM N/A 05/18/2014   Procedure: LEFT HEART CATHETERIZATION WITH CORONARY ANGIOGRAM;  Surgeon: [name], MD;  Location: [facility] CATH LAB;  Service: Cardiovascular;  Laterality: N/A;  TRANSTHORACIC ECHOCARDIOGRAM  09/14/2014   Normal EF 55-60% with mild Conc LVH. No regional WMA, essentially normal   FAMILY HISTORY Family History  Problem Relation Age of Onset   Heart disease Brother        lives in [place]   Heart attack Neg Hx    Stroke Neg Hx    SOCIAL HISTORY Social History   Tobacco Use   Smoking status: Never   Smokeless tobacco: Never  Substance Use Topics   Alcohol use: Never   Drug use: Never        OPHTHALMIC EXAM: Base Eye Exam     Visual Acuity (Snellen - Linear)       Right Left   Dist [place] 20/25 -1 20/30 -2   Dist ph [place] NI 20/30         Tonometry (Tonopen, 8:24 AM)       Right Left   Pressure 13 21         Pupils       Pupils Dark Light Shape React APD   Right PERRL 4 3 Round Brisk None   Left PERRL 4 3 Round Brisk None         Visual Fields (Counting fingers)       Left Right    Full Full         Extraocular Movement       Right Left    Full, Ortho Full, Ortho         Neuro/Psych     Oriented x3: Yes   Mood/Affect: Normal         Dilation     Both eyes: 1.0% Mydriacyl, 2.5% Phenylephrine @ 8:25 AM           Slit Lamp and Fundus Exam     Slit Lamp Exam       Right Left   Lids/Lashes Dermatochalasis - upper lid Dermatochalasis - upper lid   Conjunctiva/Sclera White and quiet Mild melanosis   Cornea Arcus, Well healed cataract wound Arcus, Well healed cataract wound   Anterior Chamber Deep and quiet Deep and quiet   Iris Round and dilated round and poorly dilated   Lens PCIOL in good position PCIOL in good position   Anterior Vitreous Vitreous syneresis, PVD, condensations Vitreous syneresis, Posterior vitreous detachment, vitreous condensations, Weiss ring         Fundus Exam       Right Left   Disc mild pallor, sharp rim, mild PPA Sharp rim, mild temporal Pallor   C/D Ratio 0.3 0.4   Macula Flat, Good foveal reflex, RPE mottling, no heme or edema Good foveal reflex, persistent cystic changes, IRH/CWS superior and temporal macula -- improved, +ERM SN macula   Vessels attenuated, Tortuous attenuated, Tortuous, +sclerosis ST arcades   Periphery Attached, no heme Attached, IRH superior midzone (extension of ST BRVO)-- improved           IMAGING AND PROCEDURES  Imaging and Procedures for 04/24/2023  OCT, Retina - OU - Both Eyes       Right Eye Quality was good. Central Foveal Thickness: 268. Progression has been  stable. Findings include normal foveal contour, no IRF, no SRF, retinal drusen .   Left Eye Quality was good. Central Foveal Thickness: 332. Progression has been stable. Findings include no SRF, abnormal foveal contour, intraretinal hyper-reflective material, cystoid macular edema, epiretinal membrane, intraretinal fluid, inner retinal atrophy (persistent IRF perifovea, persistent ERM with blunting of foveal contour, partial PVD).  Notes *Images captured and stored on drive  Diagnosis / Impression:  OD: NFP, no IRF/SRF OS: persistent IRF perifovea, persistent ERM with blunting of foveal contour, partial PVD  Clinical management:  See below  Abbreviations: NFP - Normal foveal profile. CME - cystoid macular edema. PED - pigment epithelial detachment. IRF - intraretinal fluid. SRF - subretinal fluid. EZ - ellipsoid zone. ERM - epiretinal membrane. ORA - outer retinal atrophy. ORT - outer retinal tubulation. SRHM - subretinal hyper-reflective material. IRHM - intraretinal hyper-reflective material      Intravitreal Injection, Pharmacologic Agent - OS - Left Eye       Time Out 04/24/2023. 8:55 AM. Confirmed correct patient, procedure, site, and patient consented.   Anesthesia Topical anesthesia was used. Anesthetic medications included Lidocaine 2%, Proparacaine 0.5%.   Procedure Preparation included 5% betadine to ocular surface, eyelid speculum. A (32g) needle was used.   Injection: 2 mg aflibercept 2 MG/0.05ML   Route: Intravitreal, Site: Left Eye   NDC: L6038910, Lot: [id number], Expiration date: 04/29/2024, Waste: 0 mL   Post-op Post injection exam found visual acuity of at least counting fingers. The patient tolerated the procedure well. There were no complications. The patient received written and verbal post procedure care education. Post injection medications were not given.             ASSESSMENT/PLAN:    ICD-10-CM   1. Branch retinal vein occlusion of left  eye with macular edema  H34.8320 OCT, Retina - OU - Both Eyes    Intravitreal Injection, Pharmacologic Agent - OS - Left Eye    aflibercept (EYLEA) SOLN 2 mg    2. Essential hypertension  I10     3. Hypertensive retinopathy of both eyes  H35.033     4. Epiretinal membrane (ERM) of left eye  H35.372     5. Pseudophakia of both eyes  Z96.1       1. BRVO w/ CME OS - s/p IVA OS #1 (9.21.22), #2 (10.19.22), #3 (11.16.22), #4 (12.28.22), #5 (02.22.23), #6 (06.07.23), #7 (07.26.23), #8 (09.13.23), #9 (10.18.23) -- IVA resistance - s/p IVE OS #1 (11.22.23), #2 (01.02.24), #3 (02.14.24), #4 (03.20.24) - pt was first seen on 8.10.22, but could not be treated with intravitreal injection because BP was extremely high R arm (199/122) and L arm (186/110) - pt was referred back to PCP, who has since restarted BP meds - BCVA OS 20/30 -- stable - OCT shows persistent IRF perifovea, persistent ERM with blunting of foveal contour, partial PVD **history of increase in IRF at 8 weeks, noted on 04.19.23 OCT** **history of increase in IRF at 7 weeks, noted on 09.13.23 OCT** - recommend IVE OS #5 today, 04.24.24 w/ f/u at 5 wks - pt wishes to proceed with injection of IVE - RBA of procedure discussed, questions answered - IVA informed consent obtained and re-signed, 06.07.23 (OS) - IVE informed consent obtained and signed, 11.22.23 (OS) - see procedure note - f/u in 5 weeks, DFE/OCT/FA (transit OS), possible injection  2,3. Hypertensive retinopathy OU - BPs at presentation (8.10.22) were extremely high -- R arm (199/122) and L arm (186/110)  - pt was referred back to PCP, who has since restarted BP meds - discussed importance of tight BP control.  4. Epiretinal membrane, left eye  - mild ERM - BCVA 20/30 - asymptomatic, no metamorphopsia - no indication for surgery at this time - continue to monitor  5. Pseudophakia OU  - s/p CE/IOL OU (S. Groat)  -  IOLs in good position, doing well  - continue  to monitor  Ophthalmic Meds Ordered this visit:  Meds ordered this encounter  Medications   aflibercept (EYLEA) SOLN 2 mg     Return in about 5 weeks (around 05/29/2023) for f/u BRVO OS, DFE, OCT, FA (transit OS), possible injxn.  There are no Patient Instructions on file for this visit.  This document serves as a record of services personally performed by Karie Chimera, MD, PhD. It was created on their behalf by De Blanch, an ophthalmic technician. The creation of this record is the provider's dictation and/or activities during the visit.    Electronically signed by: De Blanch, OA, 04/24/23  11:44 AM  This document serves as a record of services personally performed by Karie Chimera, MD, PhD. It was created on their behalf by Glee Arvin. Manson Passey, OA an ophthalmic technician. The creation of this record is the provider's dictation and/or activities during the visit.    Electronically signed by: Glee Arvin. Manson Passey, [place] 04.24.2024 11:44 AM  Karie Chimera, M.D., Ph.D. Diseases & Surgery of the Retina and Vitreous [facility]  I have reviewed the above documentation for accuracy and completeness, and I agree with the above. Karie Chimera, M.D., Ph.D. 04/24/23 11:45 AM   Abbreviations: M myopia (nearsighted); A astigmatism; H hyperopia (farsighted); P presbyopia; Mrx spectacle prescription;  CTL contact lenses; OD right eye; OS left eye; OU both eyes  XT exotropia; ET esotropia; PEK punctate epithelial keratitis; PEE punctate epithelial erosions; DES dry eye syndrome; MGD meibomian gland dysfunction; ATs artificial tears; PFAT's preservative free artificial tears; NSC nuclear sclerotic cataract; PSC posterior subcapsular cataract; ERM epi-retinal membrane; PVD posterior vitreous detachment; RD retinal detachment; DM diabetes mellitus; DR diabetic retinopathy; NPDR non-proliferative diabetic retinopathy; PDR proliferative diabetic retinopathy; CSME clinically  significant macular edema; DME diabetic macular edema; dbh dot blot hemorrhages; CWS cotton wool spot; POAG primary open angle glaucoma; C/D cup-to-disc ratio; HVF humphrey visual field; GVF goldmann visual field; OCT optical coherence tomography; IOP intraocular pressure; BRVO Branch retinal vein occlusion; CRVO central retinal vein occlusion; CRAO central retinal artery occlusion; BRAO branch retinal artery occlusion; RT retinal tear; SB scleral buckle; PPV pars plana vitrectomy; VH Vitreous hemorrhage; PRP panretinal laser photocoagulation; IVK intravitreal kenalog; VMT vitreomacular traction; MH Macular hole;  NVD neovascularization of the disc; NVE neovascularization elsewhere; AREDS age related eye disease study; ARMD age related macular degeneration; POAG primary open angle glaucoma; EBMD epithelial/anterior basement membrane dystrophy; ACIOL anterior chamber intraocular lens; IOL intraocular lens; PCIOL posterior chamber intraocular lens; Phaco/IOL phacoemulsification with intraocular lens placement; PRK photorefractive keratectomy; LASIK laser assisted in situ keratomileusis; HTN hypertension; DM diabetes mellitus; COPD chronic obstructive pulmonary disease

## 2023-04-24 ENCOUNTER — Encounter (INDEPENDENT_AMBULATORY_CARE_PROVIDER_SITE_OTHER): Payer: Self-pay | Admitting: Ophthalmology

## 2023-04-24 ENCOUNTER — Ambulatory Visit (INDEPENDENT_AMBULATORY_CARE_PROVIDER_SITE_OTHER): Payer: Medicare Other | Admitting: Ophthalmology

## 2023-04-24 DIAGNOSIS — H34832 Tributary (branch) retinal vein occlusion, left eye, with macular edema: Secondary | ICD-10-CM

## 2023-04-24 DIAGNOSIS — H35372 Puckering of macula, left eye: Secondary | ICD-10-CM

## 2023-04-24 DIAGNOSIS — H35033 Hypertensive retinopathy, bilateral: Secondary | ICD-10-CM

## 2023-04-24 DIAGNOSIS — Z961 Presence of intraocular lens: Secondary | ICD-10-CM

## 2023-04-24 DIAGNOSIS — I10 Essential (primary) hypertension: Secondary | ICD-10-CM

## 2023-04-24 MED ORDER — AFLIBERCEPT 2MG/0.05ML IZ SOLN FOR KALEIDOSCOPE
2.0000 mg | INTRAVITREAL | Status: AC | PRN
  Administered 2023-04-24: 2 mg via INTRAVITREAL

## 2023-05-21 NOTE — Progress Notes (Addendum)
[facility] - Clinic Note  05/29/2023    CHIEF COMPLAINT Patient presents for Retina Follow Up  HISTORY OF PRESENT ILLNESS: Roger Howe is a 67 y.o. male who presents to the clinic today for:   HPI     Retina Follow Up   Patient presents with  CRVO/BRVO.  In left eye.  Severity is moderate.  Duration of 5 weeks.  Since onset it is stable.  I, the attending physician,  performed the HPI with the patient and updated documentation appropriately.        Comments   Pt here for 5 wk ret f/u BRVO OS. Pt states VA in OS doesn't seem as clear. Since he's been here last OS develops sticky, mucousy film on and off. He does use OTC gtts for dryness.       Last edited by [name], MD on 05/29/2023 11:41 AM.      Referring physician: [name], MD [address] [place],  [place] [address]  HISTORICAL INFORMATION:  Selected notes from the MEDICAL RECORD NUMBER Referred by Dr. [name] for eval of BRVO OS   CURRENT MEDICATIONS: No current outpatient medications on file. (Ophthalmic Drugs)   No current facility-administered medications for this visit. (Ophthalmic Drugs)   Current Outpatient Medications (Other)  Medication Sig   aspirin 81 MG chewable tablet Chew 81 mg by mouth daily.   atorvastatin (LIPITOR) 20 MG tablet Take 20 mg by mouth daily at 6 PM.   carvedilol (COREG) 3.125 MG tablet TAKE 1 TABLET BY MOUTH TWICE A DAY *PLS CALL OFFICE FOR ADDITIONAL REFILLS*   fenofibrate micronized (LOFIBRA) 134 MG capsule Take 134 mg by mouth daily.   lisinopril-hydrochlorothiazide (ZESTORETIC) 10-12.5 MG tablet Take 1 tablet by mouth daily.   metoprolol succinate (TOPROL-XL) 25 MG 24 hr tablet Take 25 mg by mouth daily.   No current facility-administered medications for this visit. (Other)   REVIEW OF SYSTEMS: ROS   Positive for: Cardiovascular, Eyes Negative for: Constitutional, Gastrointestinal, Neurological, Skin, Genitourinary, Musculoskeletal,  HENT, Endocrine, Respiratory, Psychiatric, Allergic/Imm, Heme/Lymph Last edited by Thompson Grayer, COT on 05/29/2023  7:57 AM.     ALLERGIES No Known Allergies  PAST MEDICAL HISTORY Past Medical History:  Diagnosis Date   Abdominal aortic atherosclerosis (HCC)    Seen on catheterization   CAD, multiple vessel 05/18/2014   Coronary artery disease    Essential hypertension    Hypertensive retinopathy    NSTEMI (non-ST elevated myocardial infarction) (HCC) 05/17/2014   Post CABG Echo 09/19/2014: Normal EF 55-60% with mild Conc LVH. 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   LIMA to LAD, SVG to D1, Sequential SVG to PDA and RPL2, EVH via right thigh and leg   Past Surgical History:  Procedure Laterality Date   CARDIAC CATHETERIZATION  05/17/2014   DR HARDING: 95% proximal LAD, 90% ramus intermedius, 95% mid circumflex after OM1, neither percent RPDA   CORONARY ARTERY BYPASS GRAFT N/A 05/20/2014   Procedure: CORONARY ARTERY BYPASS GRAFTING (CABG) x 4,  LIMA-MID LAD,  SVG-DIAGONAL,  SEQUENTIAL SVG -PDA, PLB #2;  Surgeon: [name], MD;  Location: [facility] OR;  Service: Open Heart Surgery;  Laterality: N/A;   INTRAOPERATIVE TRANSESOPHAGEAL ECHOCARDIOGRAM N/A 05/20/2014   Procedure: INTRAOPERATIVE TRANSESOPHAGEAL ECHOCARDIOGRAM;  Surgeon: [name], MD;  Location: [facility] OR;  Service: Open Heart Surgery;  Laterality: N/A;   LEFT HEART CATHETERIZATION WITH CORONARY ANGIOGRAM N/A 05/18/2014  Procedure: LEFT HEART CATHETERIZATION WITH CORONARY ANGIOGRAM;  Surgeon: [name], MD;  Location: [facility] CATH LAB;  Service: Cardiovascular;  Laterality: N/A;   TRANSTHORACIC ECHOCARDIOGRAM  09/14/2014   Normal EF 55-60% with mild Conc LVH. No regional WMA, essentially normal   FAMILY HISTORY Family History  Problem Relation Age of Onset   Heart disease Brother        lives in [place]   Heart attack Neg Hx    Stroke Neg Hx    SOCIAL HISTORY Social History   Tobacco Use    Smoking status: Never   Smokeless tobacco: Never  Substance Use Topics   Alcohol use: Never   Drug use: Never       OPHTHALMIC EXAM: Base Eye Exam     Visual Acuity (Snellen - Linear)       Right Left   Dist [place] 20/30 +1 20/40 -2   Dist ph [place] 20/20 -2 20/25 -1         Tonometry (Tonopen, 8:04 AM)       Right Left   Pressure 12 14         Pupils       Pupils Dark Light Shape React APD   Right PERRL 4 3 Round Brisk None   Left PERRL 4 3 Round Brisk None         Visual Fields (Counting fingers)       Left Right    Full Full         Extraocular Movement       Right Left    Full, Ortho Full, Ortho         Neuro/Psych     Oriented x3: Yes   Mood/Affect: Normal         Dilation     Both eyes: 1.0% Mydriacyl, 2.5% Phenylephrine @ 8:06 AM           Slit Lamp and Fundus Exam     Slit Lamp Exam       Right Left   Lids/Lashes Dermatochalasis - upper lid Dermatochalasis - upper lid   Conjunctiva/Sclera White and quiet Mild melanosis   Cornea Arcus, Well healed cataract wound Arcus, Well healed cataract wound   Anterior Chamber Deep and quiet Deep and quiet   Iris Round and dilated round and poorly dilated   Lens PCIOL in good position PCIOL in good position   Anterior Vitreous Vitreous syneresis, PVD, vitreous condensations Vitreous syneresis, Posterior vitreous detachment, vitreous condensations, Weiss ring         Fundus Exam       Right Left   Disc mild pallor, sharp rim, mild PPA Sharp rim, mild temporal Pallor   C/D Ratio 0.3 0.4   Macula Flat, Good foveal reflex, RPE mottling, no heme or edema Good foveal reflex, persistent cystic changes -- slightly improved, IRH/CWS superior and temporal macula -- improved, +ERM SN macula   Vessels attenuated, Tortuous attenuated, Tortuous, +sclerosis ST arcades   Periphery Attached, no heme Attached, IRH superior midzone (extension of ST BRVO) -- improved           IMAGING AND PROCEDURES   Imaging and Procedures for 05/29/2023  OCT, Retina - OU - Both Eyes       Right Eye Quality was good. Central Foveal Thickness: 219. Progression has been stable. Findings include normal foveal contour, no IRF, no SRF, retinal drusen .   Left Eye Quality was good. Central Foveal Thickness: 333. Progression has improved.  Findings include no SRF, abnormal foveal contour, intraretinal hyper-reflective material, cystoid macular edema, epiretinal membrane, intraretinal fluid, inner retinal atrophy (persistent IRF perifovea -- slightly improved, stable IRA ST macula; persistent ERM with blunting of foveal contour, partial PVD).   Notes *Images captured and stored on drive  Diagnosis / Impression:  OD: NFP, no IRF/SRF OS: persistent IRF perifovea -- slightly improved, stable IRA ST macula; persistent ERM with blunting of foveal contour, partial PVD  Clinical management:  See below  Abbreviations: NFP - Normal foveal profile. CME - cystoid macular edema. PED - pigment epithelial detachment. IRF - intraretinal fluid. SRF - subretinal fluid. EZ - ellipsoid zone. ERM - epiretinal membrane. ORA - outer retinal atrophy. ORT - outer retinal tubulation. SRHM - subretinal hyper-reflective material. IRHM - intraretinal hyper-reflective material      Fluorescein Angiography Optos (Transit OS)       Right Eye Progression has no prior data. Early phase findings include normal observations. Mid/Late phase findings include normal observations.   Left Eye Progression has no prior data. Early phase findings include delayed filling, vascular perfusion defect. Mid/Late phase findings include vascular perfusion defect (Severe vascular attenuation ST quad with capillary drop out and +telangiectasias, prominent dilated and tortuous venule off nasal disc).   Notes **Images stored on drive**  Impression: OD: normal study OS: BRAO /BRVO ST quad including superior macula -- Severe vascular attenuation ST quad  with capillary drop out and +telangiectasias, prominent dilated and tortuous venule off nasal disc, no NV     Intravitreal Injection, Pharmacologic Agent - OS - Left Eye       Time Out 05/29/2023. 9:26 AM. Confirmed correct patient, procedure, site, and patient consented.   Anesthesia Topical anesthesia was used. Anesthetic medications included Lidocaine 2%, Proparacaine 0.5%.   Procedure Preparation included 5% betadine to ocular surface, eyelid speculum. A (32g) needle was used.   Injection: 2 mg aflibercept 2 MG/0.05ML   Route: Intravitreal, Site: Left Eye   NDC: L6038910, Lot: [id number], Expiration date: 04/29/2024, Waste: 0 mL   Post-op Post injection exam found visual acuity of at least counting fingers. The patient tolerated the procedure well. There were no complications. The patient received written and verbal post procedure care education. Post injection medications were not given.            ASSESSMENT/PLAN:    ICD-10-CM   1. Branch retinal vein occlusion of left eye with macular edema  H34.8320 OCT, Retina - OU - Both Eyes    Intravitreal Injection, Pharmacologic Agent - OS - Left Eye    aflibercept (EYLEA) SOLN 2 mg    2. Essential hypertension  I10     3. Hypertensive retinopathy of both eyes  H35.033 Fluorescein Angiography Optos (Transit OS)    4. Epiretinal membrane (ERM) of left eye  H35.372     5. Pseudophakia of both eyes  Z96.1      1. BRVO w/ CME OS - s/p IVA OS #1 (9.21.22), #2 (10.19.22), #3 (11.16.22), #4 (12.28.22), #5 (02.22.23), #6 (06.07.23), #7 (07.26.23), #8 (09.13.23), #9 (10.18.23) -- IVA resistance - s/p IVE OS #1 (11.22.23), #2 (01.02.24), #3 (02.14.24), #4 (03.20.24), #5 (04.24.24) - pt was first seen on 8.10.22, but could not be treated with intravitreal injection because BP was extremely high R arm (199/122) and L arm (186/110) - pt was referred back to PCP, who has since restarted BP meds - FA (05.29.24) shows OS: Severe  vascular attenuation ST quad with capillary drop out and +telangiectasias,  prominent dilated and tortuous venule off nasal disc, no NV - BCVA OS 20/30 -- stable - OCT shows ppersistent IRF perifovea -- slightly improved, stable IRA ST macula; persistent ERM with blunting of foveal contour at 5 weeks - IRA suggestive of BRAO component **history of increase in IRF at 8 weeks, noted on 04.19.23 OCT** **history of increase in IRF at 7 weeks, noted on 09.13.23 OCT** - recommend IVE OS #5 today, 05.29.24 w/ f/u extended to 5-6 wks - pt wishes to proceed with injection of IVE - RBA of procedure discussed, questions answered - IVA informed consent obtained and re-signed, 06.07.23 (OS) - IVE informed consent obtained and signed, 11.22.23 (OS) - see procedure note - f/u in 5-6 weeks, DFE/OCT, possible injection  2,3. Hypertensive retinopathy OU - BPs at presentation (8.10.22) were extremely high -- R arm (199/122) and L arm (186/110)  - pt was referred back to PCP, who has since restarted BP meds - discussed importance of tight BP control.  4. Epiretinal membrane, left eye  - mild ERM - BCVA 20/25 - asymptomatic, no metamorphopsia - no indication for surgery at this time - continue to monitor  5. Pseudophakia OU  - s/p CE/IOL OU (S. Groat)  - IOLs in good position, doing well  - continue to monitor  Ophthalmic Meds Ordered this visit:  Meds ordered this encounter  Medications   aflibercept (EYLEA) SOLN 2 mg     Return for f/u 5-6 weeks, BRVO OS, DFE, OCT.  There are no Patient Instructions on file for this visit.  This document serves as a record of services personally performed by Karie Chimera, MD, PhD. It was created on their behalf by De Blanch, an ophthalmic technician. The creation of this record is the provider's dictation and/or activities during the visit.    Electronically signed by: De Blanch, OA, 05/29/23  11:47 AM  This document serves as a record of  services personally performed by Karie Chimera, MD, PhD. It was created on their behalf by Glee Arvin. Manson Passey, OA an ophthalmic technician. The creation of this record is the provider's dictation and/or activities during the visit.    Electronically signed by: Glee Arvin. Manson Passey, [place] 05.29.2024 11:47 AM  Karie Chimera, M.D., Ph.D. Diseases & Surgery of the Retina and Vitreous [facility]  I have reviewed the above documentation for accuracy and completeness, and I agree with the above. Karie Chimera, M.D., Ph.D. 05/29/23 11:47 AM   Abbreviations: M myopia (nearsighted); A astigmatism; H hyperopia (farsighted); P presbyopia; Mrx spectacle prescription;  CTL contact lenses; OD right eye; OS left eye; OU both eyes  XT exotropia; ET esotropia; PEK punctate epithelial keratitis; PEE punctate epithelial erosions; DES dry eye syndrome; MGD meibomian gland dysfunction; ATs artificial tears; PFAT's preservative free artificial tears; NSC nuclear sclerotic cataract; PSC posterior subcapsular cataract; ERM epi-retinal membrane; PVD posterior vitreous detachment; RD retinal detachment; DM diabetes mellitus; DR diabetic retinopathy; NPDR non-proliferative diabetic retinopathy; PDR proliferative diabetic retinopathy; CSME clinically significant macular edema; DME diabetic macular edema; dbh dot blot hemorrhages; CWS cotton wool spot; POAG primary open angle glaucoma; C/D cup-to-disc ratio; HVF humphrey visual field; GVF goldmann visual field; OCT optical coherence tomography; IOP intraocular pressure; BRVO Branch retinal vein occlusion; CRVO central retinal vein occlusion; CRAO central retinal artery occlusion; BRAO branch retinal artery occlusion; RT retinal tear; SB scleral buckle; PPV pars plana vitrectomy; VH Vitreous hemorrhage; PRP panretinal laser photocoagulation; IVK intravitreal kenalog; VMT vitreomacular traction;  MH Macular hole;  NVD neovascularization of the disc; NVE  neovascularization elsewhere; AREDS age related eye disease study; ARMD age related macular degeneration; POAG primary open angle glaucoma; EBMD epithelial/anterior basement membrane dystrophy; ACIOL anterior chamber intraocular lens; IOL intraocular lens; PCIOL posterior chamber intraocular lens; Phaco/IOL phacoemulsification with intraocular lens placement; PRK photorefractive keratectomy; LASIK laser assisted in situ keratomileusis; HTN hypertension; DM diabetes mellitus; COPD chronic obstructive pulmonary disease

## 2023-05-29 ENCOUNTER — Encounter (INDEPENDENT_AMBULATORY_CARE_PROVIDER_SITE_OTHER): Payer: Self-pay | Admitting: Ophthalmology

## 2023-05-29 ENCOUNTER — Ambulatory Visit (INDEPENDENT_AMBULATORY_CARE_PROVIDER_SITE_OTHER): Payer: 59 | Admitting: Ophthalmology

## 2023-05-29 DIAGNOSIS — Z961 Presence of intraocular lens: Secondary | ICD-10-CM

## 2023-05-29 DIAGNOSIS — H35372 Puckering of macula, left eye: Secondary | ICD-10-CM

## 2023-05-29 DIAGNOSIS — I10 Essential (primary) hypertension: Secondary | ICD-10-CM | POA: Diagnosis not present

## 2023-05-29 DIAGNOSIS — H34832 Tributary (branch) retinal vein occlusion, left eye, with macular edema: Secondary | ICD-10-CM | POA: Diagnosis not present

## 2023-05-29 DIAGNOSIS — H35033 Hypertensive retinopathy, bilateral: Secondary | ICD-10-CM

## 2023-05-29 MED ORDER — AFLIBERCEPT 2MG/0.05ML IZ SOLN FOR KALEIDOSCOPE
2.0000 mg | INTRAVITREAL | Status: AC | PRN
  Administered 2023-05-29: 2 mg via INTRAVITREAL

## 2023-06-25 NOTE — Progress Notes (Signed)
[facility] - Clinic Note  07/09/2023    CHIEF COMPLAINT Patient presents for Retina Follow Up  HISTORY OF PRESENT ILLNESS: Roger Howe is a 67 y.o. male who presents to the clinic today for:   HPI     Retina Follow Up   Patient presents with  CRVO/BRVO.  In left eye.  This started 6 weeks ago.        Comments   Patient here for 6 week retina follow up for BRVO OS. Patient states vision still the same as before. No eye pain.       Last edited by Laddie Aquas, COA on 07/09/2023  9:06 AM.       Referring physician: [name], MD [address] [place],  [place] [address]  HISTORICAL INFORMATION:  Selected notes from the MEDICAL RECORD NUMBER Referred by Dr. [name] for eval of BRVO OS   CURRENT MEDICATIONS: No current outpatient medications on file. (Ophthalmic Drugs)   No current facility-administered medications for this visit. (Ophthalmic Drugs)   Current Outpatient Medications (Other)  Medication Sig   aspirin 81 MG chewable tablet Chew 81 mg by mouth daily.   atorvastatin (LIPITOR) 20 MG tablet Take 20 mg by mouth daily at 6 PM.   carvedilol (COREG) 3.125 MG tablet TAKE 1 TABLET BY MOUTH TWICE A DAY *PLS CALL OFFICE FOR ADDITIONAL REFILLS*   fenofibrate micronized (LOFIBRA) 134 MG capsule Take 134 mg by mouth daily.   lisinopril-hydrochlorothiazide (ZESTORETIC) 10-12.5 MG tablet Take 1 tablet by mouth daily.   metoprolol succinate (TOPROL-XL) 25 MG 24 hr tablet Take 25 mg by mouth daily.   No current facility-administered medications for this visit. (Other)   REVIEW OF SYSTEMS: ROS   Positive for: Cardiovascular, Eyes Negative for: Constitutional, Gastrointestinal, Neurological, Skin, Genitourinary, Musculoskeletal, HENT, Endocrine, Respiratory, Psychiatric, Allergic/Imm, Heme/Lymph Last edited by Laddie Aquas, COA on 07/09/2023  9:06 AM.      ALLERGIES No Known Allergies  PAST MEDICAL HISTORY Past Medical History:   Diagnosis Date   Abdominal aortic atherosclerosis (HCC)    Seen on catheterization   CAD, multiple vessel 05/18/2014   Coronary artery disease    Essential hypertension    Hypertensive retinopathy    NSTEMI (non-ST elevated myocardial infarction) (HCC) 05/17/2014   Post CABG Echo 09/19/2014: Normal EF 55-60% with mild Conc LVH. 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   LIMA to LAD, SVG to D1, Sequential SVG to PDA and RPL2, EVH via right thigh and leg   Past Surgical History:  Procedure Laterality Date   CARDIAC CATHETERIZATION  05/17/2014   DR HARDING: 95% proximal LAD, 90% ramus intermedius, 95% mid circumflex after OM1, neither percent RPDA   CORONARY ARTERY BYPASS GRAFT N/A 05/20/2014   Procedure: CORONARY ARTERY BYPASS GRAFTING (CABG) x 4,  LIMA-MID LAD,  SVG-DIAGONAL,  SEQUENTIAL SVG -PDA, PLB #2;  Surgeon: [name], MD;  Location: [facility] OR;  Service: Open Heart Surgery;  Laterality: N/A;   INTRAOPERATIVE TRANSESOPHAGEAL ECHOCARDIOGRAM N/A 05/20/2014   Procedure: INTRAOPERATIVE TRANSESOPHAGEAL ECHOCARDIOGRAM;  Surgeon: [name], MD;  Location: [facility] OR;  Service: Open Heart Surgery;  Laterality: N/A;   LEFT HEART CATHETERIZATION WITH CORONARY ANGIOGRAM N/A 05/18/2014   Procedure: LEFT HEART CATHETERIZATION WITH CORONARY ANGIOGRAM;  Surgeon: [name], MD;  Location: [facility] CATH LAB;  Service: Cardiovascular;  Laterality: N/A;   TRANSTHORACIC ECHOCARDIOGRAM  09/14/2014   Normal EF 55-60% with  mild Conc LVH. No regional WMA, essentially normal   FAMILY HISTORY Family History  Problem Relation Age of Onset   Heart disease Brother        lives in [place]   Heart attack Neg Hx    Stroke Neg Hx    SOCIAL HISTORY Social History   Tobacco Use   Smoking status: Never   Smokeless tobacco: Never  Vaping Use   Vaping Use: Never used  Substance Use Topics   Alcohol use: Never   Drug use: Never       OPHTHALMIC EXAM: Base Eye Exam     Visual Acuity  (Snellen - Linear)       Right Left   Dist [place] 20/25 -1 20/40 -1   Dist ph [place] NI 20/25 -2         Tonometry (Tonopen, 9:03 AM)       Right Left   Pressure 13 12         Pupils       Dark Light Shape React APD   Right 4 3 Round Brisk None   Left 4 3 Round Brisk None         Visual Fields (Counting fingers)       Left Right    Full Full         Extraocular Movement       Right Left    Full, Ortho Full, Ortho         Neuro/Psych     Oriented x3: Yes   Mood/Affect: Normal         Dilation     Both eyes: 1.0% Mydriacyl, 2.5% Phenylephrine @ 9:03 AM           Slit Lamp and Fundus Exam     Slit Lamp Exam       Right Left   Lids/Lashes Dermatochalasis - upper lid Dermatochalasis - upper lid   Conjunctiva/Sclera White and quiet Mild melanosis   Cornea Arcus, Well healed cataract wound Arcus, Well healed cataract wound   Anterior Chamber Deep and quiet Deep and quiet   Iris Round and dilated round and poorly dilated   Lens PCIOL in good position PCIOL in good position   Anterior Vitreous Vitreous syneresis, PVD, vitreous condensations Vitreous syneresis, Posterior vitreous detachment, vitreous condensations, Weiss ring         Fundus Exam       Right Left   Disc mild pallor, sharp rim, mild PPA Sharp rim, mild temporal Pallor   C/D Ratio 0.3 0.4   Macula Flat, Good foveal reflex, RPE mottling, no heme or edema Good foveal reflex, persistent cystic changes -- slightly improved, IRH/CWS superior and temporal macula -- improved, +ERM SN macula   Vessels attenuated, Tortuous attenuated, Tortuous, +sclerosis ST arcades   Periphery Attached, no heme Attached, IRH superior midzone (extension of ST BRVO) -- improved           IMAGING AND PROCEDURES  Imaging and Procedures for 07/09/2023  OCT, Retina - OU - Both Eyes       Right Eye Quality was good. Central Foveal Thickness: 273. Progression has been stable. Findings include normal foveal  contour, no IRF, no SRF, retinal drusen .   Left Eye Quality was good. Central Foveal Thickness: 326. Progression has improved. Findings include no SRF, abnormal foveal contour, intraretinal hyper-reflective material, cystoid macular edema, epiretinal membrane, intraretinal fluid, inner retinal atrophy (persistent IRF perifovea -- slightly improved, stable IRA ST macula; persistent  ERM with blunting of foveal contour, partial PVD).   Notes *Images captured and stored on drive  Diagnosis / Impression:  OD: NFP, no IRF/SRF OS: persistent IRF perifovea -- slightly improved, stable IRA ST macula; persistent ERM with blunting of foveal contour, partial PVD  Clinical management:  See below  Abbreviations: NFP - Normal foveal profile. CME - cystoid macular edema. PED - pigment epithelial detachment. IRF - intraretinal fluid. SRF - subretinal fluid. EZ - ellipsoid zone. ERM - epiretinal membrane. ORA - outer retinal atrophy. ORT - outer retinal tubulation. SRHM - subretinal hyper-reflective material. IRHM - intraretinal hyper-reflective material             ASSESSMENT/PLAN:    ICD-10-CM   1. Branch retinal vein occlusion of left eye with macular edema  H34.8320 OCT, Retina - OU - Both Eyes    2. Essential hypertension  I10     3. Hypertensive retinopathy of both eyes  H35.033     4. Epiretinal membrane (ERM) of left eye  H35.372     5. Pseudophakia of both eyes  Z96.1      1. BRVO w/ CME OS - s/p IVA OS #1 (9.21.22), #2 (10.19.22), #3 (11.16.22), #4 (12.28.22), #5 (02.22.23), #6 (06.07.23), #7 (07.26.23), #8 (09.13.23), #9 (10.18.23) -- IVA resistance - s/p IVE OS #1 (11.22.23), #2 (01.02.24), #3 (02.14.24), #4 (03.20.24), #5 (04.24.24), #5 (05.29.24) - pt was first seen on 8.10.22, but could not be treated with intravitreal injection because BP was extremely high R arm (199/122) and L arm (186/110) - pt was referred back to PCP, who has since restarted BP meds - FA (05.29.24)  shows OS: Severe vascular attenuation ST quad with capillary drop out and +telangiectasias, prominent dilated and tortuous venule off nasal disc, no NV - BCVA OS 20/25  - OCT shows persistent IRF perifovea -- slightly increased, stable IRA ST macula; persistent ERM with blunting of foveal contour, partial PVD at 5 weeks - IRA suggestive of BRAO component **history of increase in IRF at 8 weeks, noted on 04.19.23 OCT** **history of increase in IRF at 7 weeks, noted on 09.13.23 OCT** - recommend IVE OS #6 today, 07.09.24 w/ f/u extended to 5-6 wks - pt wishes to proceed with injection of IVE - RBA of procedure discussed, questions answered - IVA informed consent obtained and re-signed, 06.07.23 (OS) - IVE informed consent obtained and signed, 11.22.23 (OS) - see procedure note - f/u in 5-6 weeks, DFE/OCT, possible injection  2,3. Hypertensive retinopathy OU - BPs at presentation (8.10.22) were extremely high -- R arm (199/122) and L arm (186/110)  - pt was referred back to PCP, who has since restarted BP meds - discussed importance of tight BP control.  4. Epiretinal membrane, left eye  - mild ERM - BCVA 20/25 - asymptomatic, no metamorphopsia - no indication for surgery at this time - continue to monitor  5. Pseudophakia OU  - s/p CE/IOL OU (S. Groat)  - IOLs in good position, doing well  - continue to monitor  Ophthalmic Meds Ordered this visit:  No orders of the defined types were placed in this encounter.    No follow-ups on file.  There are no Patient Instructions on file for this visit.  This document serves as a record of services personally performed by Karie Chimera, MD, PhD. It was created on their behalf by Gerilyn Nestle, COT an ophthalmic technician. The creation of this record is the provider's dictation and/or activities during the visit.  Electronically signed by:  Charlette Caffey, COT  07/09/23 9:50 AM  This document serves as a record of services  personally performed by Karie Chimera, MD, PhD. It was created on their behalf by Glee Arvin. Manson Passey, OA an ophthalmic technician. The creation of this record is the provider's dictation and/or activities during the visit.    Electronically signed by: Glee Arvin. Manson Passey, OA 07/09/23 9:50 AM  This document serves as a record of services personally performed by Karie Chimera, MD, PhD. It was created on their behalf by Gerilyn Nestle, COT an ophthalmic technician. The creation of this record is the provider's dictation and/or activities during the visit.    Electronically signed by:  Charlette Caffey, COT  07/09/23 9:50 AM   Karie Chimera, M.D., Ph.D. Diseases & Surgery of the Retina and Vitreous [facility]    Abbreviations: M myopia (nearsighted); A astigmatism; H hyperopia (farsighted); P presbyopia; Mrx spectacle prescription;  CTL contact lenses; OD right eye; OS left eye; OU both eyes  XT exotropia; ET esotropia; PEK punctate epithelial keratitis; PEE punctate epithelial erosions; DES dry eye syndrome; MGD meibomian gland dysfunction; ATs artificial tears; PFAT's preservative free artificial tears; NSC nuclear sclerotic cataract; PSC posterior subcapsular cataract; ERM epi-retinal membrane; PVD posterior vitreous detachment; RD retinal detachment; DM diabetes mellitus; DR diabetic retinopathy; NPDR non-proliferative diabetic retinopathy; PDR proliferative diabetic retinopathy; CSME clinically significant macular edema; DME diabetic macular edema; dbh dot blot hemorrhages; CWS cotton wool spot; POAG primary open angle glaucoma; C/D cup-to-disc ratio; HVF humphrey visual field; GVF goldmann visual field; OCT optical coherence tomography; IOP intraocular pressure; BRVO Branch retinal vein occlusion; CRVO central retinal vein occlusion; CRAO central retinal artery occlusion; BRAO branch retinal artery occlusion; RT retinal tear; SB scleral buckle; PPV pars plana  vitrectomy; VH Vitreous hemorrhage; PRP panretinal laser photocoagulation; IVK intravitreal kenalog; VMT vitreomacular traction; MH Macular hole;  NVD neovascularization of the disc; NVE neovascularization elsewhere; AREDS age related eye disease study; ARMD age related macular degeneration; POAG primary open angle glaucoma; EBMD epithelial/anterior basement membrane dystrophy; ACIOL anterior chamber intraocular lens; IOL intraocular lens; PCIOL posterior chamber intraocular lens; Phaco/IOL phacoemulsification with intraocular lens placement; PRK photorefractive keratectomy; LASIK laser assisted in situ keratomileusis; HTN hypertension; DM diabetes mellitus; COPD chronic obstructive pulmonary disease

## 2023-07-09 ENCOUNTER — Encounter (INDEPENDENT_AMBULATORY_CARE_PROVIDER_SITE_OTHER): Payer: Self-pay | Admitting: Ophthalmology

## 2023-07-09 ENCOUNTER — Ambulatory Visit (INDEPENDENT_AMBULATORY_CARE_PROVIDER_SITE_OTHER): Payer: 59 | Admitting: Ophthalmology

## 2023-07-09 DIAGNOSIS — H35372 Puckering of macula, left eye: Secondary | ICD-10-CM | POA: Diagnosis not present

## 2023-07-09 DIAGNOSIS — Z961 Presence of intraocular lens: Secondary | ICD-10-CM

## 2023-07-09 DIAGNOSIS — I10 Essential (primary) hypertension: Secondary | ICD-10-CM | POA: Diagnosis not present

## 2023-07-09 DIAGNOSIS — H35033 Hypertensive retinopathy, bilateral: Secondary | ICD-10-CM

## 2023-07-09 DIAGNOSIS — H34832 Tributary (branch) retinal vein occlusion, left eye, with macular edema: Secondary | ICD-10-CM | POA: Diagnosis not present

## 2023-07-09 MED ORDER — AFLIBERCEPT 2MG/0.05ML IZ SOLN FOR KALEIDOSCOPE
2.0000 mg | INTRAVITREAL | Status: AC | PRN
  Administered 2023-07-09: 2 mg via INTRAVITREAL

## 2023-07-30 NOTE — Progress Notes (Signed)
[facility] - Clinic Note  08/13/2023    CHIEF COMPLAINT Patient presents for Retina Follow Up  HISTORY OF PRESENT ILLNESS: Roger Howe is a 67 y.o. male who presents to the clinic today for:   HPI     Retina Follow Up   Patient presents with  CRVO/BRVO.  In left eye.  This started 5 weeks ago.  Duration of 5 weeks.  Since onset it is stable.  I, the attending physician,  performed the HPI with the patient and updated documentation appropriately.        Comments   5 week retina follow up BRVO OS and I'VE OS pt is reporting no vision changes noticed he denies any flashes or floaters       Last edited by [name], MD on 08/13/2023 12:36 PM.    Patient states that he is doing well. He has not noticed any change in his vision.    Referring physician: [name], MD [address] [place],  [place] [address]  HISTORICAL INFORMATION:  Selected notes from the MEDICAL RECORD NUMBER Referred by Dr. [name] for eval of BRVO OS   CURRENT MEDICATIONS: No current outpatient medications on file. (Ophthalmic Drugs)   No current facility-administered medications for this visit. (Ophthalmic Drugs)   Current Outpatient Medications (Other)  Medication Sig   aspirin 81 MG chewable tablet Chew 81 mg by mouth daily.   atorvastatin (LIPITOR) 20 MG tablet Take 20 mg by mouth daily at 6 PM.   carvedilol (COREG) 3.125 MG tablet TAKE 1 TABLET BY MOUTH TWICE A DAY *PLS CALL OFFICE FOR ADDITIONAL REFILLS*   fenofibrate micronized (LOFIBRA) 134 MG capsule Take 134 mg by mouth daily.   lisinopril-hydrochlorothiazide (ZESTORETIC) 10-12.5 MG tablet Take 1 tablet by mouth daily.   metoprolol succinate (TOPROL-XL) 25 MG 24 hr tablet Take 25 mg by mouth daily.   No current facility-administered medications for this visit. (Other)   REVIEW OF SYSTEMS: ROS   Positive for: Cardiovascular, Eyes Negative for: Constitutional, Gastrointestinal, Neurological, Skin,  Genitourinary, Musculoskeletal, HENT, Endocrine, Respiratory, Psychiatric, Allergic/Imm, Heme/Lymph Last edited by Etheleen Mayhew, COT on 08/13/2023  9:30 AM.     ALLERGIES No Known Allergies  PAST MEDICAL HISTORY Past Medical History:  Diagnosis Date   Abdominal aortic atherosclerosis (HCC)    Seen on catheterization   CAD, multiple vessel 05/18/2014   Coronary artery disease    Essential hypertension    Hypertensive retinopathy    NSTEMI (non-ST elevated myocardial infarction) (HCC) 05/17/2014   Post CABG Echo 09/19/2014: Normal EF 55-60% with mild Conc LVH. 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   LIMA to LAD, SVG to D1, Sequential SVG to PDA and RPL2, EVH via right thigh and leg   Past Surgical History:  Procedure Laterality Date   CARDIAC CATHETERIZATION  05/17/2014   DR HARDING: 95% proximal LAD, 90% ramus intermedius, 95% mid circumflex after OM1, neither percent RPDA   CORONARY ARTERY BYPASS GRAFT N/A 05/20/2014   Procedure: CORONARY ARTERY BYPASS GRAFTING (CABG) x 4,  LIMA-MID LAD,  SVG-DIAGONAL,  SEQUENTIAL SVG -PDA, PLB #2;  Surgeon: [name], MD;  Location: [facility] OR;  Service: Open Heart Surgery;  Laterality: N/A;   INTRAOPERATIVE TRANSESOPHAGEAL ECHOCARDIOGRAM N/A 05/20/2014   Procedure: INTRAOPERATIVE TRANSESOPHAGEAL ECHOCARDIOGRAM;  Surgeon: [name], MD;  Location: [facility] OR;  Service: Open Heart Surgery;  Laterality: N/A;   LEFT HEART CATHETERIZATION WITH CORONARY  ANGIOGRAM N/A 05/18/2014   Procedure: LEFT HEART CATHETERIZATION WITH CORONARY ANGIOGRAM;  Surgeon: [name], MD;  Location: [facility] CATH LAB;  Service: Cardiovascular;  Laterality: N/A;   TRANSTHORACIC ECHOCARDIOGRAM  09/14/2014   Normal EF 55-60% with mild Conc LVH. No regional WMA, essentially normal   FAMILY HISTORY Family History  Problem Relation Age of Onset   Heart disease Brother        lives in [place]   Heart attack Neg Hx    Stroke Neg Hx    SOCIAL  HISTORY Social History   Tobacco Use   Smoking status: Never   Smokeless tobacco: Never  Vaping Use   Vaping status: Never Used  Substance Use Topics   Alcohol use: Never   Drug use: Never       OPHTHALMIC EXAM: Base Eye Exam     Visual Acuity (Snellen - Linear)       Right Left   Dist [place] 20/30 20/40 -2   Dist ph [place] NI NI         Tonometry (Tonopen, 9:34 AM)       Right Left   Pressure 12 12         Pupils       Pupils Dark Light Shape React APD   Right PERRL 4 3 Round Brisk None   Left PERRL 4 3 Round Brisk None         Visual Fields       Left Right    Full Full         Extraocular Movement       Right Left    Full, Ortho Full, Ortho         Neuro/Psych     Oriented x3: Yes   Mood/Affect: Normal         Dilation     Both eyes: 2.5% Phenylephrine @ 9:34 AM           Slit Lamp and Fundus Exam     Slit Lamp Exam       Right Left   Lids/Lashes Dermatochalasis - upper lid Dermatochalasis - upper lid   Conjunctiva/Sclera White and quiet Mild melanosis   Cornea Arcus, Well healed cataract wound Arcus, Well healed cataract wound   Anterior Chamber Deep and quiet Deep and quiet   Iris Round and dilated round and poorly dilated   Lens PCIOL in good position PCIOL in good position   Anterior Vitreous Vitreous syneresis, PVD, vitreous condensations Vitreous syneresis, Posterior vitreous detachment, vitreous condensations, Weiss ring         Fundus Exam       Right Left   Disc mild pallor, sharp rim, mild PPA Sharp rim, mild temporal Pallor   C/D Ratio 0.3 0.4   Macula Flat, Good foveal reflex, RPE mottling, no heme or edema Good foveal reflex, persistent cystic changes, IRH/CWS superior and temporal macula -- improved, +ERM SN macula   Vessels attenuated, Tortuous attenuated, Tortuous, +sclerosis ST arcades   Periphery Attached, no heme Attached, IRH superior midzone (extension of ST BRVO) -- improved           IMAGING  AND PROCEDURES  Imaging and Procedures for 08/13/2023  OCT, Retina - OU - Both Eyes       Right Eye Quality was good. Central Foveal Thickness: 266. Progression has been stable. Findings include normal foveal contour, no IRF, no SRF, retinal drusen .   Left Eye Quality was good. Central Foveal Thickness:  328. Progression has been stable. Findings include no SRF, abnormal foveal contour, intraretinal hyper-reflective material, cystoid macular edema, epiretinal membrane, intraretinal fluid, inner retinal atrophy (persistent IRF perifovea, stable IRA ST macula; persistent ERM with blunting of foveal contour, partial PVD).   Notes *Images captured and stored on drive  Diagnosis / Impression:  OD: NFP, no IRF/SRF OS: persistent IRF perifovea, stable IRA ST macula; persistent ERM with blunting of foveal contour, partial PVD  Clinical management:  See below  Abbreviations: NFP - Normal foveal profile. CME - cystoid macular edema. PED - pigment epithelial detachment. IRF - intraretinal fluid. SRF - subretinal fluid. EZ - ellipsoid zone. ERM - epiretinal membrane. ORA - outer retinal atrophy. ORT - outer retinal tubulation. SRHM - subretinal hyper-reflective material. IRHM - intraretinal hyper-reflective material      Intravitreal Injection, Pharmacologic Agent - OS - Left Eye       Time Out 08/13/2023. 10:10 AM. Confirmed correct patient, procedure, site, and patient consented.   Anesthesia Topical anesthesia was used. Anesthetic medications included Lidocaine 2%, Proparacaine 0.5%.   Procedure Preparation included 5% betadine to ocular surface, eyelid speculum. A (32g) needle was used.   Injection: 2 mg aflibercept 2 MG/0.05ML   Route: Intravitreal, Site: Left Eye   NDC: L6038910, Lot: [id number], Expiration date: 03/30/2024, Waste: 0 mL   Post-op Post injection exam found visual acuity of at least counting fingers. The patient tolerated the procedure well. There were no  complications. The patient received written and verbal post procedure care education. Post injection medications were not given.            ASSESSMENT/PLAN:    ICD-10-CM   1. Branch retinal vein occlusion of left eye with macular edema  H34.8320 OCT, Retina - OU - Both Eyes    Intravitreal Injection, Pharmacologic Agent - OS - Left Eye    aflibercept (EYLEA) SOLN 2 mg    2. Essential hypertension  I10     3. Hypertensive retinopathy of both eyes  H35.033     4. Epiretinal membrane (ERM) of left eye  H35.372     5. Pseudophakia of both eyes  Z96.1      1. BRVO w/ CME OS - s/p IVA OS #1 (9.21.22), #2 (10.19.22), #3 (11.16.22), #4 (12.28.22), #5 (02.22.23), #6 (06.07.23), #7 (07.26.23), #8 (09.13.23), #9 (10.18.23) -- IVA resistance - s/p IVE OS #1 (11.22.23), #2 (01.02.24), #3 (02.14.24), #4 (03.20.24), #5 (04.24.24), #5 (05.29.24), #6 (07.09.24) - pt was first seen on 8.10.22, but could not be treated with intravitreal injection because BP was extremely high R arm (199/122) and L arm (186/110) - pt was referred back to PCP, who has since restarted BP meds - FA (05.29.24) shows OS: Severe vascular attenuation ST quad with capillary drop out and +telangiectasias, prominent dilated and tortuous venule off nasal disc, no NV - BCVA OS 20/40 from 20/25 - OCT shows persistent IRF perifovea, stable IRA ST macula; persistent ERM with blunting of foveal contour, partial PVD at 5 weeks - IRA suggestive of BRAO component **history of increase in IRF at 8 weeks, noted on 04.19.23 OCT** **history of increase in IRF at 7 weeks, noted on 09.13.23 OCT** - recommend IVE OS #7 today, 08.13.24 w/ f/u back to 4-5 wks - pt wishes to proceed with injection of IVE - RBA of procedure discussed, questions answered - IVA informed consent obtained and re-signed, 06.07.23 (OS) - IVE informed consent obtained and signed, 11.22.23 (OS) - see procedure note - f/u in  4-5 weeks, DFE/OCT, possible  injection  2,3. Hypertensive retinopathy OU - BPs at presentation (8.10.22) were extremely high -- R arm (199/122) and L arm (186/110)  - pt was referred back to PCP, who has since restarted BP meds - discussed importance of tight BP control.  4. Epiretinal membrane, left eye  - mild ERM - BCVA 20/40- decrease - asymptomatic, no metamorphopsia - no indication for surgery at this time - continue to monitor  5. Pseudophakia OU  - s/p CE/IOL OU (S. [place])  - IOLs in good position, doing well  - continue to monitor  Ophthalmic Meds Ordered this visit:  Meds ordered this encounter  Medications   aflibercept (EYLEA) SOLN 2 mg     Return in about 4 weeks (around 09/10/2023) for f/u BRVO OS, DFE, OCT, Possible, IVE, OS.  There are no Patient Instructions on file for this visit.  This document serves as a record of services personally performed by Karie Chimera, MD, PhD. It was created on their behalf by Gerilyn Nestle, COT an ophthalmic technician. The creation of this record is the provider's dictation and/or activities during the visit.    Electronically signed by:  Charlette Caffey, COT  08/13/23 12:37 PM  Karie Chimera, M.D., Ph.D. Diseases & Surgery of the Retina and Vitreous [facility]  I have reviewed the above documentation for accuracy and completeness, and I agree with the above. Karie Chimera, M.D., Ph.D. 08/13/23 12:39 PM   Abbreviations: M myopia (nearsighted); A astigmatism; H hyperopia (farsighted); P presbyopia; Mrx spectacle prescription;  CTL contact lenses; OD right eye; OS left eye; OU both eyes  XT exotropia; ET esotropia; PEK punctate epithelial keratitis; PEE punctate epithelial erosions; DES dry eye syndrome; MGD meibomian gland dysfunction; ATs artificial tears; PFAT's preservative free artificial tears; NSC nuclear sclerotic cataract; PSC posterior subcapsular cataract; ERM epi-retinal membrane; PVD posterior vitreous  detachment; RD retinal detachment; DM diabetes mellitus; DR diabetic retinopathy; NPDR non-proliferative diabetic retinopathy; PDR proliferative diabetic retinopathy; CSME clinically significant macular edema; DME diabetic macular edema; dbh dot blot hemorrhages; CWS cotton wool spot; POAG primary open angle glaucoma; C/D cup-to-disc ratio; HVF humphrey visual field; GVF goldmann visual field; OCT optical coherence tomography; IOP intraocular pressure; BRVO Branch retinal vein occlusion; CRVO central retinal vein occlusion; CRAO central retinal artery occlusion; BRAO branch retinal artery occlusion; RT retinal tear; SB scleral buckle; PPV pars plana vitrectomy; VH Vitreous hemorrhage; PRP panretinal laser photocoagulation; IVK intravitreal kenalog; VMT vitreomacular traction; MH Macular hole;  NVD neovascularization of the disc; NVE neovascularization elsewhere; AREDS age related eye disease study; ARMD age related macular degeneration; POAG primary open angle glaucoma; EBMD epithelial/anterior basement membrane dystrophy; ACIOL anterior chamber intraocular lens; IOL intraocular lens; PCIOL posterior chamber intraocular lens; Phaco/IOL phacoemulsification with intraocular lens placement; PRK photorefractive keratectomy; LASIK laser assisted in situ keratomileusis; HTN hypertension; DM diabetes mellitus; COPD chronic obstructive pulmonary disease

## 2023-08-13 ENCOUNTER — Ambulatory Visit (INDEPENDENT_AMBULATORY_CARE_PROVIDER_SITE_OTHER): Payer: 59 | Admitting: Ophthalmology

## 2023-08-13 ENCOUNTER — Encounter (INDEPENDENT_AMBULATORY_CARE_PROVIDER_SITE_OTHER): Payer: Self-pay | Admitting: Ophthalmology

## 2023-08-13 DIAGNOSIS — I10 Essential (primary) hypertension: Secondary | ICD-10-CM

## 2023-08-13 DIAGNOSIS — H35372 Puckering of macula, left eye: Secondary | ICD-10-CM | POA: Diagnosis not present

## 2023-08-13 DIAGNOSIS — H35033 Hypertensive retinopathy, bilateral: Secondary | ICD-10-CM

## 2023-08-13 DIAGNOSIS — H34832 Tributary (branch) retinal vein occlusion, left eye, with macular edema: Secondary | ICD-10-CM | POA: Diagnosis not present

## 2023-08-13 DIAGNOSIS — Z961 Presence of intraocular lens: Secondary | ICD-10-CM

## 2023-08-13 MED ORDER — AFLIBERCEPT 2MG/0.05ML IZ SOLN FOR KALEIDOSCOPE
2.0000 mg | INTRAVITREAL | Status: AC | PRN
Start: 2023-08-13 — End: 2023-08-13
  Administered 2023-08-13: 2 mg via INTRAVITREAL

## 2023-08-28 NOTE — Progress Notes (Signed)
[facility] - Clinic Note  09/10/2023    CHIEF COMPLAINT Patient presents for Retina Follow Up  HISTORY OF PRESENT ILLNESS: Roger Howe is a 67 y.o. male who presents to the clinic today for:   HPI     Retina Follow Up   Patient presents with  CRVO/BRVO.  In left eye.  This started 4 weeks ago.  Duration of 4 weeks.  Since onset it is stable.  I, the attending physician,  performed the HPI with the patient and updated documentation appropriately.        Comments   4 week retina follow up IVE OS pt is reporting improved vision he is reporting some floaters denies any flashes       Last edited by [name], MD on 09/10/2023 12:21 PM.     Patient states sometimes when he opens his eyes his vision is clear, other times it is blurry, he states when his vision is blurry, his eyes also water, he uses AT's PRN   Referring physician: [name], MD [address] [place],  [place] [address]  HISTORICAL INFORMATION:  Selected notes from the MEDICAL RECORD NUMBER Referred by Dr. [name] for eval of BRVO OS   CURRENT MEDICATIONS: No current outpatient medications on file. (Ophthalmic Drugs)   No current facility-administered medications for this visit. (Ophthalmic Drugs)   Current Outpatient Medications (Other)  Medication Sig   aspirin 81 MG chewable tablet Chew 81 mg by mouth daily.   atorvastatin (LIPITOR) 20 MG tablet Take 20 mg by mouth daily at 6 PM.   carvedilol (COREG) 3.125 MG tablet TAKE 1 TABLET BY MOUTH TWICE A DAY *PLS CALL OFFICE FOR ADDITIONAL REFILLS*   fenofibrate micronized (LOFIBRA) 134 MG capsule Take 134 mg by mouth daily.   lisinopril-hydrochlorothiazide (ZESTORETIC) 10-12.5 MG tablet Take 1 tablet by mouth daily.   metoprolol succinate (TOPROL-XL) 25 MG 24 hr tablet Take 25 mg by mouth daily.   No current facility-administered medications for this visit. (Other)   REVIEW OF SYSTEMS: ROS   Positive for: Cardiovascular,  Eyes Negative for: Constitutional, Gastrointestinal, Neurological, Skin, Genitourinary, Musculoskeletal, HENT, Endocrine, Respiratory, Psychiatric, Allergic/Imm, Heme/Lymph Last edited by Etheleen Mayhew, COT on 09/10/2023  9:00 AM.      ALLERGIES No Known Allergies  PAST MEDICAL HISTORY Past Medical History:  Diagnosis Date   Abdominal aortic atherosclerosis (HCC)    Seen on catheterization   CAD, multiple vessel 05/18/2014   Coronary artery disease    Essential hypertension    Hypertensive retinopathy    NSTEMI (non-ST elevated myocardial infarction) (HCC) 05/17/2014   Post CABG Echo 09/19/2014: Normal EF 55-60% with mild Conc LVH. 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   LIMA to LAD, SVG to D1, Sequential SVG to PDA and RPL2, EVH via right thigh and leg   Past Surgical History:  Procedure Laterality Date   CARDIAC CATHETERIZATION  05/17/2014   DR HARDING: 95% proximal LAD, 90% ramus intermedius, 95% mid circumflex after OM1, neither percent RPDA   CORONARY ARTERY BYPASS GRAFT N/A 05/20/2014   Procedure: CORONARY ARTERY BYPASS GRAFTING (CABG) x 4,  LIMA-MID LAD,  SVG-DIAGONAL,  SEQUENTIAL SVG -PDA, PLB #2;  Surgeon: [name], MD;  Location: [facility] OR;  Service: Open Heart Surgery;  Laterality: N/A;   INTRAOPERATIVE TRANSESOPHAGEAL ECHOCARDIOGRAM N/A 05/20/2014   Procedure: INTRAOPERATIVE TRANSESOPHAGEAL ECHOCARDIOGRAM;  Surgeon: [name], MD;  Location: [facility] OR;  Service: Open Heart Surgery;  Laterality: N/A;   LEFT HEART CATHETERIZATION WITH CORONARY ANGIOGRAM N/A 05/18/2014   Procedure: LEFT HEART CATHETERIZATION WITH CORONARY ANGIOGRAM;  Surgeon: [name], MD;  Location: [facility] CATH LAB;  Service: Cardiovascular;  Laterality: N/A;   TRANSTHORACIC ECHOCARDIOGRAM  09/14/2014   Normal EF 55-60% with mild Conc LVH. No regional WMA, essentially normal   FAMILY HISTORY Family History  Problem Relation Age of Onset   Heart disease Brother         lives in [place]   Heart attack Neg Hx    Stroke Neg Hx    SOCIAL HISTORY Social History   Tobacco Use   Smoking status: Never   Smokeless tobacco: Never  Vaping Use   Vaping status: Never Used  Substance Use Topics   Alcohol use: Never   Drug use: Never       OPHTHALMIC EXAM: Base Eye Exam     Visual Acuity (Snellen - Linear)       Right Left   Dist [place] 20/30 20/40 -2   Dist ph [place] NI NI         Tonometry (Tonopen, 9:04 AM)       Right Left   Pressure 10 12         Pupils       Pupils Dark Light Shape React APD   Right PERRL 4 3 Round Brisk None   Left PERRL 4 3 Round Brisk None         Visual Fields       Left Right    Full Full         Extraocular Movement       Right Left    Full, Ortho Full, Ortho         Neuro/Psych     Oriented x3: Yes   Mood/Affect: Normal         Dilation     Both eyes: 2.5% Phenylephrine @ 9:04 AM           Slit Lamp and Fundus Exam     Slit Lamp Exam       Right Left   Lids/Lashes Dermatochalasis - upper lid Dermatochalasis - upper lid   Conjunctiva/Sclera White and quiet Mild melanosis   Cornea Arcus, Well healed cataract wound Arcus, Well healed cataract wound   Anterior Chamber Deep and quiet Deep and quiet   Iris Round and dilated round and poorly dilated   Lens PCIOL in good position PCIOL in good position   Anterior Vitreous Vitreous syneresis, PVD, vitreous condensations Vitreous syneresis, Posterior vitreous detachment, vitreous condensations, Weiss ring         Fundus Exam       Right Left   Disc mild pallor, sharp rim, mild PPA Sharp rim, mild temporal Pallor   C/D Ratio 0.3 0.4   Macula Flat, Good foveal reflex, RPE mottling, no heme or edema Good foveal reflex, persistent cystic changes -- improved, IRH/CWS superior and temporal macula -- improved, +ERM SN macula   Vessels attenuated, Tortuous attenuated, Tortuous, +sclerosis ST arcades   Periphery Attached, no heme Attached, IRH  superior midzone (extension of ST BRVO) -- improved           IMAGING AND PROCEDURES  Imaging and Procedures for 09/10/2023  OCT, Retina - OU - Both Eyes       Right Eye Quality was good. Central Foveal Thickness: 263. Progression has been stable. Findings include normal foveal contour, no  IRF, no SRF, retinal drusen .   Left Eye Quality was good. Central Foveal Thickness: 326. Progression has improved. Findings include no SRF, abnormal foveal contour, intraretinal hyper-reflective material, cystoid macular edema, epiretinal membrane, intraretinal fluid, inner retinal atrophy (persistent IRF perifovea -- improved, stable IRA ST macula; persistent ERM with blunting of foveal contour, partial PVD).   Notes *Images captured and stored on drive  Diagnosis / Impression:  OD: NFP, no IRF/SRF OS: persistent IRF perifovea -- improved, stable IRA ST macula; persistent ERM with blunting of foveal contour, partial PVD  Clinical management:  See below  Abbreviations: NFP - Normal foveal profile. CME - cystoid macular edema. PED - pigment epithelial detachment. IRF - intraretinal fluid. SRF - subretinal fluid. EZ - ellipsoid zone. ERM - epiretinal membrane. ORA - outer retinal atrophy. ORT - outer retinal tubulation. SRHM - subretinal hyper-reflective material. IRHM - intraretinal hyper-reflective material      Intravitreal Injection, Pharmacologic Agent - OS - Left Eye       Time Out 09/10/2023. 9:42 AM. Confirmed correct patient, procedure, site, and patient consented.   Anesthesia Topical anesthesia was used. Anesthetic medications included Lidocaine 2%, Proparacaine 0.5%.   Procedure Preparation included 5% betadine to ocular surface, eyelid speculum. A (32g) needle was used.   Injection: 2 mg aflibercept 2 MG/0.05ML   Route: Intravitreal, Site: Left Eye   NDC: L6038910, Lot: [id number], Expiration date: 11/29/2024, Waste: 0 mL   Post-op Post injection exam found visual  acuity of at least counting fingers. The patient tolerated the procedure well. There were no complications. The patient received written and verbal post procedure care education. Post injection medications were not given.            ASSESSMENT/PLAN:    ICD-10-CM   1. Branch retinal vein occlusion of left eye with macular edema  H34.8320 OCT, Retina - OU - Both Eyes    Intravitreal Injection, Pharmacologic Agent - OS - Left Eye    aflibercept (EYLEA) SOLN 2 mg    2. Essential hypertension  I10     3. Hypertensive retinopathy of both eyes  H35.033     4. Epiretinal membrane (ERM) of left eye  H35.372     5. Pseudophakia of both eyes  Z96.1      1. BRVO w/ CME OS - s/p IVA OS #1 (9.21.22), #2 (10.19.22), #3 (11.16.22), #4 (12.28.22), #5 (02.22.23), #6 (06.07.23), #7 (07.26.23), #8 (09.13.23), #9 (10.18.23) -- IVA resistance - s/p IVE OS #1 (11.22.23), #2 (01.02.24), #3 (02.14.24), #4 (03.20.24), #5 (04.24.24), #5 (05.29.24), #6 (07.09.24), #7 (08.13.24) - pt was first seen on 8.10.22, but could not be treated with intravitreal injection because BP was extremely high R arm (199/122) and L arm (186/110) - pt was referred back to PCP, who has since restarted BP meds - FA (05.29.24) shows OS: Severe vascular attenuation ST quad with capillary drop out and +telangiectasias, prominent dilated and tortuous venule off nasal disc, no NV - BCVA OS 20/40 -- stable - OCT shows persistent IRF perifovea -- improved, stable IRA ST macula; persistent ERM with blunting of foveal contour, partial PVD at 4 weeks - IRA suggestive of BRAO component **history of increased IRF at 5 wks, noted on 08.13.24** **history of increase in IRF at 8 weeks, noted on 04.19.23 OCT** **history of increase in IRF at 7 weeks, noted on 09.13.23 OCT** - recommend IVE OS #8 today, 09.10.24 w/ f/u in 4 wks again - pt wishes to proceed with injection of  IVE - RBA of procedure discussed, questions answered - IVE informed  consent obtained and signed, 11.22.23 (OS) - see procedure note - f/u in 4 weeks, DFE/OCT, possible injection  2,3. Hypertensive retinopathy OU - BPs at presentation (8.10.22) were extremely high -- R arm (199/122) and L arm (186/110)  - pt was referred back to PCP, who has since restarted BP meds - discussed importance of tight BP control.  4. Epiretinal membrane, left eye  - mild ERM - BCVA 20/40 -- stable - asymptomatic, no metamorphopsia - no indication for surgery at this time - continue to monitor  5. Pseudophakia OU  - s/p CE/IOL OU (S. Groat)  - IOLs in good position, doing well  - continue to monitor  Ophthalmic Meds Ordered this visit:  Meds ordered this encounter  Medications   aflibercept (EYLEA) SOLN 2 mg     Return in about 4 weeks (around 10/08/2023) for f/u BRVO OS, DFE, OCT.  There are no Patient Instructions on file for this visit.  This document serves as a record of services personally performed by Karie Chimera, MD, PhD. It was created on their behalf by Laurey Morale, COT an ophthalmic technician. The creation of this record is the provider's dictation and/or activities during the visit.    Electronically signed by:  Charlette Caffey, COT  09/10/23 12:22 PM  This document serves as a record of services personally performed by Karie Chimera, MD, PhD. It was created on their behalf by Glee Arvin. Manson Passey, OA an ophthalmic technician. The creation of this record is the provider's dictation and/or activities during the visit.    Electronically signed by: Glee Arvin. Manson Passey, OA 09/10/23 12:22 PM  Karie Chimera, M.D., Ph.D. Diseases & Surgery of the Retina and Vitreous [facility]  I have reviewed the above documentation for accuracy and completeness, and I agree with the above. Karie Chimera, M.D., Ph.D. 09/10/23 12:24 PM  Abbreviations: M myopia (nearsighted); A astigmatism; H hyperopia (farsighted); P presbyopia; Mrx  spectacle prescription;  CTL contact lenses; OD right eye; OS left eye; OU both eyes  XT exotropia; ET esotropia; PEK punctate epithelial keratitis; PEE punctate epithelial erosions; DES dry eye syndrome; MGD meibomian gland dysfunction; ATs artificial tears; PFAT's preservative free artificial tears; NSC nuclear sclerotic cataract; PSC posterior subcapsular cataract; ERM epi-retinal membrane; PVD posterior vitreous detachment; RD retinal detachment; DM diabetes mellitus; DR diabetic retinopathy; NPDR non-proliferative diabetic retinopathy; PDR proliferative diabetic retinopathy; CSME clinically significant macular edema; DME diabetic macular edema; dbh dot blot hemorrhages; CWS cotton wool spot; POAG primary open angle glaucoma; C/D cup-to-disc ratio; HVF humphrey visual field; GVF goldmann visual field; OCT optical coherence tomography; IOP intraocular pressure; BRVO Branch retinal vein occlusion; CRVO central retinal vein occlusion; CRAO central retinal artery occlusion; BRAO branch retinal artery occlusion; RT retinal tear; SB scleral buckle; PPV pars plana vitrectomy; VH Vitreous hemorrhage; PRP panretinal laser photocoagulation; IVK intravitreal kenalog; VMT vitreomacular traction; MH Macular hole;  NVD neovascularization of the disc; NVE neovascularization elsewhere; AREDS age related eye disease study; ARMD age related macular degeneration; POAG primary open angle glaucoma; EBMD epithelial/anterior basement membrane dystrophy; ACIOL anterior chamber intraocular lens; IOL intraocular lens; PCIOL posterior chamber intraocular lens; Phaco/IOL phacoemulsification with intraocular lens placement; PRK photorefractive keratectomy; LASIK laser assisted in situ keratomileusis; HTN hypertension; DM diabetes mellitus; COPD chronic obstructive pulmonary disease

## 2023-09-10 ENCOUNTER — Encounter (INDEPENDENT_AMBULATORY_CARE_PROVIDER_SITE_OTHER): Payer: Self-pay | Admitting: Ophthalmology

## 2023-09-10 ENCOUNTER — Ambulatory Visit (INDEPENDENT_AMBULATORY_CARE_PROVIDER_SITE_OTHER): Payer: 59 | Admitting: Ophthalmology

## 2023-09-10 DIAGNOSIS — H35033 Hypertensive retinopathy, bilateral: Secondary | ICD-10-CM

## 2023-09-10 DIAGNOSIS — H35372 Puckering of macula, left eye: Secondary | ICD-10-CM

## 2023-09-10 DIAGNOSIS — Z961 Presence of intraocular lens: Secondary | ICD-10-CM

## 2023-09-10 DIAGNOSIS — H34832 Tributary (branch) retinal vein occlusion, left eye, with macular edema: Secondary | ICD-10-CM

## 2023-09-10 DIAGNOSIS — I10 Essential (primary) hypertension: Secondary | ICD-10-CM | POA: Diagnosis not present

## 2023-09-10 MED ORDER — AFLIBERCEPT 2MG/0.05ML IZ SOLN FOR KALEIDOSCOPE
2.0000 mg | INTRAVITREAL | Status: AC | PRN
Start: 2023-09-10 — End: 2023-09-10
  Administered 2023-09-10: 2 mg via INTRAVITREAL

## 2023-09-24 NOTE — Progress Notes (Signed)
[facility] - Clinic Note  10/08/2023    CHIEF COMPLAINT Patient presents for Retina Follow Up  HISTORY OF PRESENT ILLNESS: Roger Howe is a 67 y.o. male who presents to the clinic today for:   HPI     Retina Follow Up   Patient presents with  CRVO/BRVO.  In left eye.  Severity is moderate.  Duration of 4 weeks.  Since onset it is stable.        Comments   4 week Retina eval for Brvo os. Patient states no vision changes      Last edited by Lana Fish, COT on 10/08/2023  9:03 AM.     Patient states the vision in the left eye is not good.  Referring physician: [name], MD [address] [place],  [place] [address]  HISTORICAL INFORMATION:  Selected notes from the MEDICAL RECORD NUMBER Referred by Dr. [name] for eval of BRVO OS   CURRENT MEDICATIONS: No current outpatient medications on file. (Ophthalmic Drugs)   No current facility-administered medications for this visit. (Ophthalmic Drugs)   Current Outpatient Medications (Other)  Medication Sig   aspirin 81 MG chewable tablet Chew 81 mg by mouth daily.   atorvastatin (LIPITOR) 20 MG tablet Take 20 mg by mouth daily at 6 PM.   carvedilol (COREG) 3.125 MG tablet TAKE 1 TABLET BY MOUTH TWICE A DAY *PLS CALL OFFICE FOR ADDITIONAL REFILLS*   fenofibrate micronized (LOFIBRA) 134 MG capsule Take 134 mg by mouth daily.   lisinopril-hydrochlorothiazide (ZESTORETIC) 10-12.5 MG tablet Take 1 tablet by mouth daily.   metoprolol succinate (TOPROL-XL) 25 MG 24 hr tablet Take 25 mg by mouth daily.   No current facility-administered medications for this visit. (Other)   REVIEW OF SYSTEMS: ROS   Positive for: Cardiovascular, Eyes Negative for: Constitutional, Gastrointestinal, Neurological, Skin, Genitourinary, Musculoskeletal, HENT, Endocrine, Respiratory, Psychiatric, Allergic/Imm, Heme/Lymph Last edited by Lana Fish, COT on 10/08/2023  9:00 AM.       ALLERGIES No Known  Allergies  PAST MEDICAL HISTORY Past Medical History:  Diagnosis Date   Abdominal aortic atherosclerosis (HCC)    Seen on catheterization   CAD, multiple vessel 05/18/2014   Coronary artery disease    Essential hypertension    Hypertensive retinopathy    NSTEMI (non-ST elevated myocardial infarction) (HCC) 05/17/2014   Post CABG Echo 09/19/2014: Normal EF 55-60% with mild Conc LVH. 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   LIMA to LAD, SVG to D1, Sequential SVG to PDA and RPL2, EVH via right thigh and leg   Past Surgical History:  Procedure Laterality Date   CARDIAC CATHETERIZATION  05/17/2014   DR HARDING: 95% proximal LAD, 90% ramus intermedius, 95% mid circumflex after OM1, neither percent RPDA   CORONARY ARTERY BYPASS GRAFT N/A 05/20/2014   Procedure: CORONARY ARTERY BYPASS GRAFTING (CABG) x 4,  LIMA-MID LAD,  SVG-DIAGONAL,  SEQUENTIAL SVG -PDA, PLB #2;  Surgeon: [name], MD;  Location: [facility] OR;  Service: Open Heart Surgery;  Laterality: N/A;   INTRAOPERATIVE TRANSESOPHAGEAL ECHOCARDIOGRAM N/A 05/20/2014   Procedure: INTRAOPERATIVE TRANSESOPHAGEAL ECHOCARDIOGRAM;  Surgeon: [name], MD;  Location: [facility] OR;  Service: Open Heart Surgery;  Laterality: N/A;   LEFT HEART CATHETERIZATION WITH CORONARY ANGIOGRAM N/A 05/18/2014   Procedure: LEFT HEART CATHETERIZATION WITH CORONARY ANGIOGRAM;  Surgeon: [name], MD;  Location: [facility] CATH LAB;  Service: Cardiovascular;  Laterality: N/A;   TRANSTHORACIC  ECHOCARDIOGRAM  09/14/2014   Normal EF 55-60% with mild Conc LVH. No regional WMA, essentially normal   FAMILY HISTORY Family History  Problem Relation Age of Onset   Heart disease Brother        lives in [place]   Heart attack Neg Hx    Stroke Neg Hx    SOCIAL HISTORY Social History   Tobacco Use   Smoking status: Never   Smokeless tobacco: Never  Vaping Use   Vaping status: Never Used  Substance Use Topics   Alcohol use: Never   Drug use: Never        OPHTHALMIC EXAM: Base Eye Exam     Visual Acuity (Snellen - Linear)       Right Left   Dist [place] 20/30 20/40+2   Dist ph [place] 20/NI 20/NI         Tonometry (Tonopen, 9:02 AM)       Right Left   Pressure 13 13         Pupils       Dark Light Shape React APD   Right 4 3 Round Brisk None   Left 4 3 Round Brisk None         Visual Fields (Counting fingers)       Left Right    Full Full         Extraocular Movement       Right Left    Full, Ortho Full, Ortho         Neuro/Psych     Oriented x3: Yes   Mood/Affect: Normal         Dilation     Both eyes: 1.0% Mydriacyl, 2.5% Phenylephrine @ 9:02 AM           IMAGING AND PROCEDURES  Imaging and Procedures for 10/08/2023          ASSESSMENT/PLAN:    ICD-10-CM   1. Branch retinal vein occlusion of left eye with macular edema  H34.8320 OCT, Retina - OU - Both Eyes    2. Essential hypertension  I10     3. Hypertensive retinopathy of both eyes  H35.033     4. Epiretinal membrane (ERM) of left eye  H35.372     5. Pseudophakia of both eyes  Z96.1       1. BRVO w/ CME OS - s/p IVA OS #1 (9.21.22), #2 (10.19.22), #3 (11.16.22), #4 (12.28.22), #5 (02.22.23), #6 (06.07.23), #7 (07.26.23), #8 (09.13.23), #9 (10.18.23) -- IVA resistance - s/p IVE OS #1 (11.22.23), #2 (01.02.24), #3 (02.14.24), #4 (03.20.24), #5 (04.24.24), #5 (05.29.24), #6 (07.09.24), #7 (08.13.24), #9 (09.10.24) - pt was first seen on 8.10.22, but could not be treated with intravitreal injection because BP was extremely high R arm (199/122) and L arm (186/110) - pt was referred back to PCP, who has since restarted BP meds - FA (05.29.24) shows OS: Severe vascular attenuation ST quad with capillary drop out and +telangiectasias, prominent dilated and tortuous venule off nasal disc, no NV - BCVA OS 20/40 -- stable - OCT shows persistent IRF perifovea -- improved, stable IRA ST macula; persistent ERM with blunting of foveal  contour, partial PVD at 4 weeks - IRA suggestive of BRAO component **history of increased IRF at 5 wks, noted on 08.13.24** **history of increase in IRF at 8 weeks, noted on 04.19.23 OCT** **history of increase in IRF at 7 weeks, noted on 09.13.23 OCT** - recommend IVE OS #9 today, 10.08.24 w/ f/u in 4  wks again - pt wishes to proceed with injection of IVE - RBA of procedure discussed, questions answered - IVE informed consent obtained and signed, 11.22.23 (OS) - see procedure note - f/u in 4 weeks, DFE/OCT, possible injection  2,3. Hypertensive retinopathy OU - BPs at presentation (8.10.22) were extremely high -- R arm (199/122) and L arm (186/110)  - pt was referred back to PCP, who has since restarted BP meds - discussed importance of tight BP control.  4. Epiretinal membrane, left eye  - mild ERM - BCVA 20/40 -- stable - asymptomatic, no metamorphopsia - no indication for surgery at this time - continue to monitor  5. Pseudophakia OU  - s/p CE/IOL OU (S. Groat)  - IOLs in good position, doing well  - continue to monitor  Ophthalmic Meds Ordered this visit:  No orders of the defined types were placed in this encounter.    No follow-ups on file.  There are no Patient Instructions on file for this visit.  This document serves as a record of services personally performed by Karie Chimera, MD, PhD. It was created on their behalf by Laurey Morale, COT an ophthalmic technician. The creation of this record is the provider's dictation and/or activities during the visit.    Electronically signed by:  Charlette Caffey, COT  10/08/23 10:19 AM  Karie Chimera, M.D., Ph.D. Diseases & Surgery of the Retina and Vitreous [facility]  Abbreviations: M myopia (nearsighted); A astigmatism; H hyperopia (farsighted); P presbyopia; Mrx spectacle prescription;  CTL contact lenses; OD right eye; OS left eye; OU both eyes  XT exotropia; ET esotropia; PEK  punctate epithelial keratitis; PEE punctate epithelial erosions; DES dry eye syndrome; MGD meibomian gland dysfunction; ATs artificial tears; PFAT's preservative free artificial tears; NSC nuclear sclerotic cataract; PSC posterior subcapsular cataract; ERM epi-retinal membrane; PVD posterior vitreous detachment; RD retinal detachment; DM diabetes mellitus; DR diabetic retinopathy; NPDR non-proliferative diabetic retinopathy; PDR proliferative diabetic retinopathy; CSME clinically significant macular edema; DME diabetic macular edema; dbh dot blot hemorrhages; CWS cotton wool spot; POAG primary open angle glaucoma; C/D cup-to-disc ratio; HVF humphrey visual field; GVF goldmann visual field; OCT optical coherence tomography; IOP intraocular pressure; BRVO Branch retinal vein occlusion; CRVO central retinal vein occlusion; CRAO central retinal artery occlusion; BRAO branch retinal artery occlusion; RT retinal tear; SB scleral buckle; PPV pars plana vitrectomy; VH Vitreous hemorrhage; PRP panretinal laser photocoagulation; IVK intravitreal kenalog; VMT vitreomacular traction; MH Macular hole;  NVD neovascularization of the disc; NVE neovascularization elsewhere; AREDS age related eye disease study; ARMD age related macular degeneration; POAG primary open angle glaucoma; EBMD epithelial/anterior basement membrane dystrophy; ACIOL anterior chamber intraocular lens; IOL intraocular lens; PCIOL posterior chamber intraocular lens; Phaco/IOL phacoemulsification with intraocular lens placement; PRK photorefractive keratectomy; LASIK laser assisted in situ keratomileusis; HTN hypertension; DM diabetes mellitus; COPD chronic obstructive pulmonary disease

## 2023-10-08 ENCOUNTER — Ambulatory Visit (INDEPENDENT_AMBULATORY_CARE_PROVIDER_SITE_OTHER): Payer: 59 | Admitting: Ophthalmology

## 2023-10-08 ENCOUNTER — Encounter (INDEPENDENT_AMBULATORY_CARE_PROVIDER_SITE_OTHER): Payer: Self-pay | Admitting: Ophthalmology

## 2023-10-08 DIAGNOSIS — H35033 Hypertensive retinopathy, bilateral: Secondary | ICD-10-CM

## 2023-10-08 DIAGNOSIS — H35372 Puckering of macula, left eye: Secondary | ICD-10-CM

## 2023-10-08 DIAGNOSIS — I10 Essential (primary) hypertension: Secondary | ICD-10-CM | POA: Diagnosis not present

## 2023-10-08 DIAGNOSIS — H34832 Tributary (branch) retinal vein occlusion, left eye, with macular edema: Secondary | ICD-10-CM

## 2023-10-08 DIAGNOSIS — Z961 Presence of intraocular lens: Secondary | ICD-10-CM

## 2023-10-08 MED ORDER — AFLIBERCEPT 2MG/0.05ML IZ SOLN FOR KALEIDOSCOPE
2.0000 mg | INTRAVITREAL | Status: AC | PRN
Start: 2023-10-08 — End: 2023-10-08
  Administered 2023-10-08: 2 mg via INTRAVITREAL

## 2023-10-23 NOTE — Progress Notes (Signed)
[facility] - Clinic Note  11/05/2023    CHIEF COMPLAINT Patient presents for Retina Follow Up  HISTORY OF PRESENT ILLNESS: Roger Howe is a 67 y.o. male who presents to the clinic today for:   HPI     Retina Follow Up   Patient presents with  CRVO/BRVO.  In left eye.  This started 4 weeks ago.  Duration of 4 weeks.  Since onset it is stable.  I, the attending physician,  performed the HPI with the patient and updated documentation appropriately.        Comments   4 week retina follow up BRVO OS pt is reporting no vision changes noticed he is reporting vision is little better he denies any flashes or floaters       Last edited by [name], MD on 11/05/2023 11:32 AM.    Patient states vision is a little better than it was  Referring physician: [name], MD [address] [place],  [place] [address]  HISTORICAL INFORMATION:  Selected notes from the MEDICAL RECORD NUMBER Referred by Dr. [name] for eval of BRVO OS   CURRENT MEDICATIONS: No current outpatient medications on file. (Ophthalmic Drugs)   No current facility-administered medications for this visit. (Ophthalmic Drugs)   Current Outpatient Medications (Other)  Medication Sig   aspirin 81 MG chewable tablet Chew 81 mg by mouth daily.   atorvastatin (LIPITOR) 20 MG tablet Take 20 mg by mouth daily at 6 PM.   carvedilol (COREG) 3.125 MG tablet TAKE 1 TABLET BY MOUTH TWICE A DAY *PLS CALL OFFICE FOR ADDITIONAL REFILLS*   fenofibrate micronized (LOFIBRA) 134 MG capsule Take 134 mg by mouth daily.   lisinopril-hydrochlorothiazide (ZESTORETIC) 10-12.5 MG tablet Take 1 tablet by mouth daily.   metoprolol succinate (TOPROL-XL) 25 MG 24 hr tablet Take 25 mg by mouth daily.   No current facility-administered medications for this visit. (Other)   REVIEW OF SYSTEMS: ROS   Positive for: Cardiovascular, Eyes Negative for: Constitutional, Gastrointestinal, Neurological, Skin,  Genitourinary, Musculoskeletal, HENT, Endocrine, Respiratory, Psychiatric, Allergic/Imm, Heme/Lymph Last edited by Etheleen Mayhew, COT on 11/05/2023  8:55 AM.        ALLERGIES No Known Allergies  PAST MEDICAL HISTORY Past Medical History:  Diagnosis Date   Abdominal aortic atherosclerosis (HCC)    Seen on catheterization   CAD, multiple vessel 05/18/2014   Coronary artery disease    Essential hypertension    Hypertensive retinopathy    NSTEMI (non-ST elevated myocardial infarction) (HCC) 05/17/2014   Post CABG Echo 09/19/2014: Normal EF 55-60% with mild Conc LVH. 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   LIMA to LAD, SVG to D1, Sequential SVG to PDA and RPL2, EVH via right thigh and leg   Past Surgical History:  Procedure Laterality Date   CARDIAC CATHETERIZATION  05/17/2014   DR HARDING: 95% proximal LAD, 90% ramus intermedius, 95% mid circumflex after OM1, neither percent RPDA   CORONARY ARTERY BYPASS GRAFT N/A 05/20/2014   Procedure: CORONARY ARTERY BYPASS GRAFTING (CABG) x 4,  LIMA-MID LAD,  SVG-DIAGONAL,  SEQUENTIAL SVG -PDA, PLB #2;  Surgeon: [name], MD;  Location: [facility] OR;  Service: Open Heart Surgery;  Laterality: N/A;   INTRAOPERATIVE TRANSESOPHAGEAL ECHOCARDIOGRAM N/A 05/20/2014   Procedure: INTRAOPERATIVE TRANSESOPHAGEAL ECHOCARDIOGRAM;  Surgeon: [name], MD;  Location: [facility] OR;  Service: Open Heart Surgery;  Laterality: N/A;   LEFT HEART CATHETERIZATION WITH CORONARY ANGIOGRAM  N/A 05/18/2014   Procedure: LEFT HEART CATHETERIZATION WITH CORONARY ANGIOGRAM;  Surgeon: [name], MD;  Location: [facility] CATH LAB;  Service: Cardiovascular;  Laterality: N/A;   TRANSTHORACIC ECHOCARDIOGRAM  09/14/2014   Normal EF 55-60% with mild Conc LVH. No regional WMA, essentially normal   FAMILY HISTORY Family History  Problem Relation Age of Onset   Heart disease Brother        lives in [place]   Heart attack Neg Hx    Stroke Neg Hx    SOCIAL  HISTORY Social History   Tobacco Use   Smoking status: Never   Smokeless tobacco: Never  Vaping Use   Vaping status: Never Used  Substance Use Topics   Alcohol use: Never   Drug use: Never       OPHTHALMIC EXAM: Base Eye Exam     Visual Acuity (Snellen - Linear)       Right Left   Dist [place] 20/30 -2 20/40   Dist ph cc NI NI         Tonometry (Tonopen, 8:59 AM)       Right Left   Pressure 14 18         Pupils       Pupils Dark Light Shape React APD   Right PERRL 4 3 Round Brisk None   Left PERRL 4 3 Round Brisk None         Visual Fields       Left Right    Full Full         Extraocular Movement       Right Left    Full, Ortho Full, Ortho         Neuro/Psych     Oriented x3: Yes   Mood/Affect: Normal         Dilation     Both eyes: 2.5% Phenylephrine @ 8:56 AM           Slit Lamp and Fundus Exam     Slit Lamp Exam       Right Left   Lids/Lashes Dermatochalasis - upper lid Dermatochalasis - upper lid   Conjunctiva/Sclera White and quiet Mild melanosis   Cornea Arcus, Well healed cataract wound Arcus, Well healed cataract wound   Anterior Chamber Deep and quiet Deep and quiet   Iris Round and dilated round and poorly dilated   Lens PCIOL in good position PCIOL in good position   Anterior Vitreous Vitreous syneresis, PVD, vitreous condensations Vitreous syneresis, Posterior vitreous detachment, vitreous condensations, Weiss ring         Fundus Exam       Right Left   Disc mild pallor, sharp rim, mild PPA Sharp rim, mild temporal Pallor   C/D Ratio 0.3 0.4   Macula Flat, Good foveal reflex, RPE mottling, no heme or edema Good foveal reflex, persistent cystic changes -- slightly improved, IRH/CWS superior and temporal macula -- improved, +ERM SN macula   Vessels attenuated, Tortuous attenuated, Tortuous, +sclerosis ST arcades   Periphery Attached, no heme Attached, IRH superior midzone (extension of ST BRVO) -- improved            IMAGING AND PROCEDURES  Imaging and Procedures for 11/05/2023  OCT, Retina - OU - Both Eyes       Right Eye Quality was good. Central Foveal Thickness: 259. Progression has been stable. Findings include normal foveal contour, no IRF, no SRF, retinal drusen .   Left Eye Quality was good. Central  Foveal Thickness: 312. Progression has improved. Findings include no SRF, abnormal foveal contour, intraretinal hyper-reflective material, cystoid macular edema, epiretinal membrane, intraretinal fluid, inner retinal atrophy (persistent IRF perifovea -- slightly improved, stable IRA ST macula; persistent ERM with blunting of foveal contour, partial PVD).   Notes *Images captured and stored on drive  Diagnosis / Impression:  OD: NFP, no IRF/SRF OS: persistent IRF perifovea -- slightly improved, stable IRA ST macula; persistent ERM with blunting of foveal contour, partial PVD  Clinical management:  See below  Abbreviations: NFP - Normal foveal profile. CME - cystoid macular edema. PED - pigment epithelial detachment. IRF - intraretinal fluid. SRF - subretinal fluid. EZ - ellipsoid zone. ERM - epiretinal membrane. ORA - outer retinal atrophy. ORT - outer retinal tubulation. SRHM - subretinal hyper-reflective material. IRHM - intraretinal hyper-reflective material      Intravitreal Injection, Pharmacologic Agent - OS - Left Eye       Time Out 11/05/2023. 9:31 AM. Confirmed correct patient, procedure, site, and patient consented.   Anesthesia Topical anesthesia was used. Anesthetic medications included Lidocaine 2%, Proparacaine 0.5%.   Procedure Preparation included 5% betadine to ocular surface, eyelid speculum. A (32g) needle was used.   Injection: 2 mg aflibercept 2 MG/0.05ML   Route: Intravitreal, Site: Left Eye   NDC: L6038910, Lot: [id number], Expiration date: 08/30/2024, Waste: 0 mL   Post-op Post injection exam found visual acuity of at least counting fingers. The  patient tolerated the procedure well. There were no complications. The patient received written and verbal post procedure care education. Post injection medications were not given.            ASSESSMENT/PLAN:    ICD-10-CM   1. Branch retinal vein occlusion of left eye with macular edema  H34.8320 OCT, Retina - OU - Both Eyes    Intravitreal Injection, Pharmacologic Agent - OS - Left Eye    aflibercept (EYLEA) SOLN 2 mg    2. Essential hypertension  I10     3. Hypertensive retinopathy of both eyes  H35.033     4. Epiretinal membrane (ERM) of left eye  H35.372     5. Pseudophakia of both eyes  Z96.1      1. BRVO w/ CME OS - s/p IVA OS #1 (9.21.22), #2 (10.19.22), #3 (11.16.22), #4 (12.28.22), #5 (02.22.23), #6 (06.07.23), #7 (07.26.23), #8 (09.13.23), #9 (10.18.23) -- IVA resistance - s/p IVE OS #1 (11.22.23), #2 (01.02.24), #3 (02.14.24), #4 (03.20.24), #5 (04.24.24), #5 (05.29.24), #6 (07.09.24), #7 (08.13.24), #9 (09.10.24), #10 (10.08.24) - pt was first seen on 8.10.22, but could not be treated with intravitreal injection because BP was extremely high R arm (199/122) and L arm (186/110) - pt was referred back to PCP, who has since restarted BP meds - FA (05.29.24) shows OS: Severe vascular attenuation ST quad with capillary drop out and +telangiectasias, prominent dilated and tortuous venule off nasal disc, no NV - BCVA OS 20/40 -- stable - OCT shows OS: persistent IRF perifovea -- slightly improved, stable IRA ST macula; persistent ERM with blunting of foveal contour, partial PVD at 4 weeks - IRA suggestive of BRAO component **history of increased IRF at 5 wks, noted on 08.13.24** **history of increase in IRF at 8 weeks, noted on 04.19.23 OCT** **history of increase in IRF at 7 weeks, noted on 09.13.23 OCT** - recommend IVE OS #11 today, 11.05.24 w/ f/u in 4 wks again - pt wishes to proceed with injection of IVE - RBA of procedure discussed,  questions answered - IVE informed  consent obtained and signed, 11.05.24 (OS) - see procedure note - f/u in 4 weeks, DFE/OCT, possible injection  2,3. Hypertensive retinopathy OU - BPs at presentation (8.10.22) were extremely high -- R arm (199/122) and L arm (186/110)  - pt was referred back to PCP, who has since restarted BP meds - discussed importance of tight BP control.  4. Epiretinal membrane, left eye  - mild ERM - BCVA 20/40 -- stable - asymptomatic, no metamorphopsia - no indication for surgery at this time - continue to monitor  5. Pseudophakia OU  - s/p CE/IOL OU (S. Groat)  - IOLs in good position, doing well  - continue to monitor  Ophthalmic Meds Ordered this visit:  Meds ordered this encounter  Medications   aflibercept (EYLEA) SOLN 2 mg     Return in about 4 weeks (around 12/03/2023) for f/u BRVO OS, DFE, OCT.  There are no Patient Instructions on file for this visit.  This document serves as a record of services personally performed by Karie Chimera, MD, PhD. It was created on their behalf by Laurey Morale, COT an ophthalmic technician. The creation of this record is the provider's dictation and/or activities during the visit.    Electronically signed by:  Charlette Caffey, COT  11/05/23 5:23 PM  This document serves as a record of services personally performed by Karie Chimera, MD, PhD. It was created on their behalf by Glee Arvin. Manson Passey, OA an ophthalmic technician. The creation of this record is the provider's dictation and/or activities during the visit.    Electronically signed by: Glee Arvin. Manson Passey, OA 11/05/23 5:23 PM   Karie Chimera, M.D., Ph.D. Diseases & Surgery of the Retina and Vitreous [facility]  I have reviewed the above documentation for accuracy and completeness, and I agree with the above. Karie Chimera, M.D., Ph.D. 11/05/23 5:24 PM  Abbreviations: M myopia (nearsighted); A astigmatism; H hyperopia (farsighted); P presbyopia; Mrx  spectacle prescription;  CTL contact lenses; OD right eye; OS left eye; OU both eyes  XT exotropia; ET esotropia; PEK punctate epithelial keratitis; PEE punctate epithelial erosions; DES dry eye syndrome; MGD meibomian gland dysfunction; ATs artificial tears; PFAT's preservative free artificial tears; NSC nuclear sclerotic cataract; PSC posterior subcapsular cataract; ERM epi-retinal membrane; PVD posterior vitreous detachment; RD retinal detachment; DM diabetes mellitus; DR diabetic retinopathy; NPDR non-proliferative diabetic retinopathy; PDR proliferative diabetic retinopathy; CSME clinically significant macular edema; DME diabetic macular edema; dbh dot blot hemorrhages; CWS cotton wool spot; POAG primary open angle glaucoma; C/D cup-to-disc ratio; HVF humphrey visual field; GVF goldmann visual field; OCT optical coherence tomography; IOP intraocular pressure; BRVO Branch retinal vein occlusion; CRVO central retinal vein occlusion; CRAO central retinal artery occlusion; BRAO branch retinal artery occlusion; RT retinal tear; SB scleral buckle; PPV pars plana vitrectomy; VH Vitreous hemorrhage; PRP panretinal laser photocoagulation; IVK intravitreal kenalog; VMT vitreomacular traction; MH Macular hole;  NVD neovascularization of the disc; NVE neovascularization elsewhere; AREDS age related eye disease study; ARMD age related macular degeneration; POAG primary open angle glaucoma; EBMD epithelial/anterior basement membrane dystrophy; ACIOL anterior chamber intraocular lens; IOL intraocular lens; PCIOL posterior chamber intraocular lens; Phaco/IOL phacoemulsification with intraocular lens placement; PRK photorefractive keratectomy; LASIK laser assisted in situ keratomileusis; HTN hypertension; DM diabetes mellitus; COPD chronic obstructive pulmonary disease

## 2023-11-05 ENCOUNTER — Encounter (INDEPENDENT_AMBULATORY_CARE_PROVIDER_SITE_OTHER): Payer: Self-pay | Admitting: Ophthalmology

## 2023-11-05 ENCOUNTER — Ambulatory Visit (INDEPENDENT_AMBULATORY_CARE_PROVIDER_SITE_OTHER): Payer: 59 | Admitting: Ophthalmology

## 2023-11-05 DIAGNOSIS — I10 Essential (primary) hypertension: Secondary | ICD-10-CM | POA: Diagnosis not present

## 2023-11-05 DIAGNOSIS — H35372 Puckering of macula, left eye: Secondary | ICD-10-CM

## 2023-11-05 DIAGNOSIS — H35033 Hypertensive retinopathy, bilateral: Secondary | ICD-10-CM | POA: Diagnosis not present

## 2023-11-05 DIAGNOSIS — Z961 Presence of intraocular lens: Secondary | ICD-10-CM

## 2023-11-05 DIAGNOSIS — H34832 Tributary (branch) retinal vein occlusion, left eye, with macular edema: Secondary | ICD-10-CM

## 2023-11-05 MED ORDER — AFLIBERCEPT 2MG/0.05ML IZ SOLN FOR KALEIDOSCOPE
2.0000 mg | INTRAVITREAL | Status: AC | PRN
  Administered 2023-11-05: 2 mg via INTRAVITREAL

## 2023-11-19 NOTE — Progress Notes (Signed)
[facility] - Clinic Note  12/03/2023    CHIEF COMPLAINT Patient presents for Retina Follow Up  HISTORY OF PRESENT ILLNESS: Roger Howe is a 67 y.o. male who presents to the clinic today for:   HPI     Retina Follow Up   Patient presents with  CRVO/BRVO.  In left eye.  This started 4 weeks ago.  I, the attending physician,  performed the HPI with the patient and updated documentation appropriately.      Last edited by [name], MD on 12/03/2023 12:56 PM.    Patient states the vision is improving.   Referring physician: [name], MD [address] [place],  [place] [address]  HISTORICAL INFORMATION:  Selected notes from the MEDICAL RECORD NUMBER Referred by Dr. [name] for eval of BRVO OS   CURRENT MEDICATIONS: No current outpatient medications on file. (Ophthalmic Drugs)   No current facility-administered medications for this visit. (Ophthalmic Drugs)   Current Outpatient Medications (Other)  Medication Sig   aspirin 81 MG chewable tablet Chew 81 mg by mouth daily.   atorvastatin (LIPITOR) 20 MG tablet Take 20 mg by mouth daily at 6 PM.   carvedilol (COREG) 3.125 MG tablet TAKE 1 TABLET BY MOUTH TWICE A DAY *PLS CALL OFFICE FOR ADDITIONAL REFILLS*   fenofibrate micronized (LOFIBRA) 134 MG capsule Take 134 mg by mouth daily.   lisinopril-hydrochlorothiazide (ZESTORETIC) 10-12.5 MG tablet Take 1 tablet by mouth daily.   metoprolol succinate (TOPROL-XL) 25 MG 24 hr tablet Take 25 mg by mouth daily.   No current facility-administered medications for this visit. (Other)   REVIEW OF SYSTEMS: ROS   Positive for: Cardiovascular, Eyes Negative for: Constitutional, Gastrointestinal, Neurological, Skin, Genitourinary, Musculoskeletal, HENT, Endocrine, Respiratory, Psychiatric, Allergic/Imm, Heme/Lymph Last edited by Laddie Aquas, COA on 12/03/2023  9:45 AM.     ALLERGIES No Known Allergies  PAST MEDICAL HISTORY Past Medical History:   Diagnosis Date   Abdominal aortic atherosclerosis (HCC)    Seen on catheterization   CAD, multiple vessel 05/18/2014   Coronary artery disease    Essential hypertension    Hypertensive retinopathy    NSTEMI (non-ST elevated myocardial infarction) (HCC) 05/17/2014   Post CABG Echo 09/19/2014: Normal EF 55-60% with mild Conc LVH. 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   LIMA to LAD, SVG to D1, Sequential SVG to PDA and RPL2, EVH via right thigh and leg   Past Surgical History:  Procedure Laterality Date   CARDIAC CATHETERIZATION  05/17/2014   DR HARDING: 95% proximal LAD, 90% ramus intermedius, 95% mid circumflex after OM1, neither percent RPDA   CORONARY ARTERY BYPASS GRAFT N/A 05/20/2014   Procedure: CORONARY ARTERY BYPASS GRAFTING (CABG) x 4,  LIMA-MID LAD,  SVG-DIAGONAL,  SEQUENTIAL SVG -PDA, PLB #2;  Surgeon: [name], MD;  Location: [facility] OR;  Service: Open Heart Surgery;  Laterality: N/A;   INTRAOPERATIVE TRANSESOPHAGEAL ECHOCARDIOGRAM N/A 05/20/2014   Procedure: INTRAOPERATIVE TRANSESOPHAGEAL ECHOCARDIOGRAM;  Surgeon: [name], MD;  Location: [facility] OR;  Service: Open Heart Surgery;  Laterality: N/A;   LEFT HEART CATHETERIZATION WITH CORONARY ANGIOGRAM N/A 05/18/2014   Procedure: LEFT HEART CATHETERIZATION WITH CORONARY ANGIOGRAM;  Surgeon: [name], MD;  Location: [facility] CATH LAB;  Service: Cardiovascular;  Laterality: N/A;   TRANSTHORACIC ECHOCARDIOGRAM  09/14/2014   Normal EF 55-60% with mild Conc LVH. No regional WMA, essentially normal   FAMILY HISTORY Family History  Problem Relation Age of Onset   Heart disease Brother        lives in [place]   Heart attack Neg Hx    Stroke Neg Hx    SOCIAL HISTORY Social History   Tobacco Use   Smoking status: Never   Smokeless tobacco: Never  Vaping Use   Vaping status: Never Used  Substance Use Topics   Alcohol use: Never   Drug use: Never       OPHTHALMIC EXAM: Base Eye Exam     Visual  Acuity (Snellen - Linear)       Right Left   Dist [place] 20/30 -2 20/40 -2         Tonometry (Tonopen, 9:43 AM)       Right Left   Pressure 11 13         Pupils       Dark Light Shape React APD   Right 3 2 Round Brisk None   Left 3 2 Round Brisk None         Visual Fields (Counting fingers)       Left Right    Full Full         Extraocular Movement       Right Left    Full, Ortho Full, Ortho         Neuro/Psych     Oriented x3: Yes   Mood/Affect: Normal         Dilation     Both eyes: 1.0% Mydriacyl, 2.5% Phenylephrine @ 9:43 AM           Slit Lamp and Fundus Exam     Slit Lamp Exam       Right Left   Lids/Lashes Dermatochalasis - upper lid Dermatochalasis - upper lid   Conjunctiva/Sclera White and quiet Mild melanosis   Cornea Arcus, Well healed cataract wound Arcus, Well healed cataract wound   Anterior Chamber Deep and quiet Deep and quiet   Iris Round and dilated round and poorly dilated   Lens PCIOL in good position PCIOL in good position   Anterior Vitreous Vitreous syneresis, PVD, vitreous condensations Vitreous syneresis, Posterior vitreous detachment, vitreous condensations, Weiss ring         Fundus Exam       Right Left   Disc mild pallor, sharp rim, mild PPA Sharp rim, mild temporal Pallor   C/D Ratio 0.3 0.4   Macula Flat, Good foveal reflex, RPE mottling, no heme or edema Good foveal reflex, persistent cystic changes -- slightly improved, IRH/CWS superior and temporal macula -- stably improved, +ERM SN macula   Vessels attenuated, Tortuous attenuated, Tortuous, +sclerosis ST arcades   Periphery Attached, no heme Attached, IRH superior midzone (extension of ST BRVO) -- stably improved           IMAGING AND PROCEDURES  Imaging and Procedures for 12/03/2023  OCT, Retina - OU - Both Eyes       Right Eye Quality was good. Central Foveal Thickness: 271. Progression has been stable. Findings include normal foveal  contour, no IRF, no SRF, retinal drusen .   Left Eye Quality was good. Central Foveal Thickness: 319. Progression has improved. Findings include no SRF, abnormal foveal contour, intraretinal hyper-reflective material, cystoid macular edema, epiretinal membrane, intraretinal fluid, inner retinal atrophy (persistent IRF perifovea -- slightly improved, stable IRA ST macula; persistent ERM with blunting of foveal contour).   Notes *Images captured and stored on drive  Diagnosis / Impression:  OD:  NFP, no IRF/SRF OS: persistent IRF perifovea -- slightly improved, stable IRA ST macula; persistent ERM with blunting of foveal contour  Clinical management:  See below  Abbreviations: NFP - Normal foveal profile. CME - cystoid macular edema. PED - pigment epithelial detachment. IRF - intraretinal fluid. SRF - subretinal fluid. EZ - ellipsoid zone. ERM - epiretinal membrane. ORA - outer retinal atrophy. ORT - outer retinal tubulation. SRHM - subretinal hyper-reflective material. IRHM - intraretinal hyper-reflective material      Intravitreal Injection, Pharmacologic Agent - OS - Left Eye       Time Out 12/03/2023. 10:07 AM. Confirmed correct patient, procedure, site, and patient consented.   Anesthesia Topical anesthesia was used. Anesthetic medications included Lidocaine 2%, Proparacaine 0.5%.   Procedure Preparation included 5% betadine to ocular surface, eyelid speculum. A (32g) needle was used.   Injection: 2 mg aflibercept 2 MG/0.05ML   Route: Intravitreal, Site: Left Eye   NDC: L6038910, Lot: [id number], Expiration date: 03/30/2025, Waste: 0 mL   Post-op Post injection exam found visual acuity of at least counting fingers. The patient tolerated the procedure well. There were no complications. The patient received written and verbal post procedure care education. Post injection medications were not given.            ASSESSMENT/PLAN:    ICD-10-CM   1. Branch retinal vein  occlusion of left eye with macular edema  H34.8320 OCT, Retina - OU - Both Eyes    Intravitreal Injection, Pharmacologic Agent - OS - Left Eye    aflibercept (EYLEA) SOLN 2 mg    2. Essential hypertension  I10     3. Hypertensive retinopathy of both eyes  H35.033     4. Epiretinal membrane (ERM) of left eye  H35.372     5. Pseudophakia of both eyes  Z96.1      1. BRVO w/ CME OS - s/p IVA OS #1 (9.21.22), #2 (10.19.22), #3 (11.16.22), #4 (12.28.22), #5 (02.22.23), #6 (06.07.23), #7 (07.26.23), #8 (09.13.23), #9 (10.18.23) -- IVA resistance - s/p IVE OS #1 (11.22.23), #2 (01.02.24), #3 (02.14.24), #4 (03.20.24), #5 (04.24.24), #5 (05.29.24), #6 (07.09.24), #7 (08.13.24), #9 (09.10.24), #10 (10.08.24), #11 (11.05.24) - pt was first seen on 8.10.22, but could not be treated with intravitreal injection because BP was extremely high R arm (199/122) and L arm (186/110) - pt was referred back to PCP, who has since restarted BP meds - FA (05.29.24) shows OS: Severe vascular attenuation ST quad with capillary drop out and +telangiectasias, prominent dilated and tortuous venule off nasal disc, no NV - BCVA OS 20/40 -- stable - OCT shows OS: persistent IRF perifovea -- slightly improved, stable IRA ST macula; persistent ERM with blunting of foveal contour at 4 weeks - IRA suggestive of BRAO component **history of increased IRF at 5 wks, noted on 08.13.24** **history of increase in IRF at 8 weeks, noted on 04.19.23 OCT** **history of increase in IRF at 7 weeks, noted on 09.13.23 OCT** - recommend IVE OS #12 today, 12.03.24 w/ f/u in 4 wks again - pt wishes to proceed with injection of IVE - RBA of procedure discussed, questions answered - IVE informed consent obtained and signed, 11.05.24 (OS) - see procedure note - f/u in 4 weeks, DFE/OCT, possible injection  2,3. Hypertensive retinopathy OU - BPs at presentation (8.10.22) were extremely high -- R arm (199/122) and L arm (186/110)  - pt was  referred back to PCP, who has since restarted BP meds - discussed importance  of tight BP control.  4. Epiretinal membrane, left eye  - mild ERM - BCVA 20/40 -- stable - asymptomatic, no metamorphopsia - no indication for surgery at this time - continue to monitor  5. Pseudophakia OU  - s/p CE/IOL OU (S. Groat)  - IOLs in good position, doing well  - continue to monitor  Ophthalmic Meds Ordered this visit:  Meds ordered this encounter  Medications   aflibercept (EYLEA) SOLN 2 mg     Return in about 4 weeks (around 12/31/2023) for f/u BRVO OS , DFE, OCT, Possible, IVE, OS.  There are no Patient Instructions on file for this visit.  This document serves as a record of services personally performed by Karie Chimera, MD, PhD. It was created on their behalf by Laurey Morale, COT an ophthalmic technician. The creation of this record is the provider's dictation and/or activities during the visit.    Electronically signed by:  Charlette Caffey, COT  12/03/23 12:57 PM  Karie Chimera, M.D., Ph.D. Diseases & Surgery of the Retina and Vitreous [facility]  I have reviewed the above documentation for accuracy and completeness, and I agree with the above. Karie Chimera, M.D., Ph.D. 12/03/23 12:58 PM   Abbreviations: M myopia (nearsighted); A astigmatism; H hyperopia (farsighted); P presbyopia; Mrx spectacle prescription;  CTL contact lenses; OD right eye; OS left eye; OU both eyes  XT exotropia; ET esotropia; PEK punctate epithelial keratitis; PEE punctate epithelial erosions; DES dry eye syndrome; MGD meibomian gland dysfunction; ATs artificial tears; PFAT's preservative free artificial tears; NSC nuclear sclerotic cataract; PSC posterior subcapsular cataract; ERM epi-retinal membrane; PVD posterior vitreous detachment; RD retinal detachment; DM diabetes mellitus; DR diabetic retinopathy; NPDR non-proliferative diabetic retinopathy; PDR proliferative diabetic  retinopathy; CSME clinically significant macular edema; DME diabetic macular edema; dbh dot blot hemorrhages; CWS cotton wool spot; POAG primary open angle glaucoma; C/D cup-to-disc ratio; HVF humphrey visual field; GVF goldmann visual field; OCT optical coherence tomography; IOP intraocular pressure; BRVO Branch retinal vein occlusion; CRVO central retinal vein occlusion; CRAO central retinal artery occlusion; BRAO branch retinal artery occlusion; RT retinal tear; SB scleral buckle; PPV pars plana vitrectomy; VH Vitreous hemorrhage; PRP panretinal laser photocoagulation; IVK intravitreal kenalog; VMT vitreomacular traction; MH Macular hole;  NVD neovascularization of the disc; NVE neovascularization elsewhere; AREDS age related eye disease study; ARMD age related macular degeneration; POAG primary open angle glaucoma; EBMD epithelial/anterior basement membrane dystrophy; ACIOL anterior chamber intraocular lens; IOL intraocular lens; PCIOL posterior chamber intraocular lens; Phaco/IOL phacoemulsification with intraocular lens placement; PRK photorefractive keratectomy; LASIK laser assisted in situ keratomileusis; HTN hypertension; DM diabetes mellitus; COPD chronic obstructive pulmonary disease

## 2023-12-03 ENCOUNTER — Encounter (INDEPENDENT_AMBULATORY_CARE_PROVIDER_SITE_OTHER): Payer: Self-pay | Admitting: Ophthalmology

## 2023-12-03 ENCOUNTER — Ambulatory Visit (INDEPENDENT_AMBULATORY_CARE_PROVIDER_SITE_OTHER): Payer: 59 | Admitting: Ophthalmology

## 2023-12-03 DIAGNOSIS — H35033 Hypertensive retinopathy, bilateral: Secondary | ICD-10-CM | POA: Diagnosis not present

## 2023-12-03 DIAGNOSIS — H35372 Puckering of macula, left eye: Secondary | ICD-10-CM

## 2023-12-03 DIAGNOSIS — Z961 Presence of intraocular lens: Secondary | ICD-10-CM

## 2023-12-03 DIAGNOSIS — H34832 Tributary (branch) retinal vein occlusion, left eye, with macular edema: Secondary | ICD-10-CM

## 2023-12-03 DIAGNOSIS — I10 Essential (primary) hypertension: Secondary | ICD-10-CM

## 2023-12-03 MED ORDER — AFLIBERCEPT 2MG/0.05ML IZ SOLN FOR KALEIDOSCOPE
2.0000 mg | INTRAVITREAL | Status: AC | PRN
  Administered 2023-12-03: 2 mg via INTRAVITREAL

## 2023-12-19 NOTE — Progress Notes (Signed)
 [facility] - Clinic Note  12/31/2023    CHIEF COMPLAINT Patient presents for Retina Follow Up  HISTORY OF PRESENT ILLNESS: Roger Howe is a 67 y.o. male who presents to the clinic today for:   HPI     Retina Follow Up   Patient presents with  CRVO/BRVO.  In left eye.  This started 4 weeks ago.  Duration of 4 weeks.  Since onset it is stable.  I, the attending physician,  performed the HPI with the patient and updated documentation appropriately.        Comments   4 week retina follow up BRVO OS and I'VE OS pt is reporting no vision changes noticed denies any flashes or floaters       Last edited by Valdemar Rogue, MD on 12/31/2023  6:27 PM.    Patient feels the eyes get sticky at time. He feels the vision is the same.   Referring physician: Octavia Charlie Hamilton, MD [address] [place],  [place] [address]  HISTORICAL INFORMATION:  Selected notes from the MEDICAL RECORD NUMBER Referred by Dr. Octavia for eval of BRVO OS   CURRENT MEDICATIONS: No current outpatient medications on file. (Ophthalmic Drugs)   No current facility-administered medications for this visit. (Ophthalmic Drugs)   Current Outpatient Medications (Other)  Medication Sig   aspirin  81 MG chewable tablet Chew 81 mg by mouth daily.   atorvastatin  (LIPITOR) 20 MG tablet Take 20 mg by mouth daily at 6 PM.   carvedilol  (COREG ) 3.125 MG tablet TAKE 1 TABLET BY MOUTH TWICE A DAY *PLS CALL OFFICE FOR ADDITIONAL REFILLS*   fenofibrate micronized (LOFIBRA) 134 MG capsule Take 134 mg by mouth daily.   lisinopril -hydrochlorothiazide  (ZESTORETIC ) 10-12.5 MG tablet Take 1 tablet by mouth daily.   metoprolol  succinate (TOPROL -XL) 25 MG 24 hr tablet Take 25 mg by mouth daily.   No current facility-administered medications for this visit. (Other)   REVIEW OF SYSTEMS: ROS   Positive for: Cardiovascular, Eyes Negative for: Constitutional, Gastrointestinal, Neurological, Skin, Genitourinary,  Musculoskeletal, HENT, Endocrine, Respiratory, Psychiatric, Allergic/Imm, Heme/Lymph Last edited by Resa Delon ORN, COT on 12/31/2023  9:12 AM.      ALLERGIES No Known Allergies  PAST MEDICAL HISTORY Past Medical History:  Diagnosis Date   Abdominal aortic atherosclerosis (HCC)    Seen on catheterization   CAD, multiple vessel 05/18/2014   Coronary artery disease    Essential hypertension    Hypertensive retinopathy    NSTEMI (non-ST elevated myocardial infarction) (HCC) 05/17/2014   Post CABG Echo 09/19/2014: Normal EF 55-60% with mild Conc LVH. 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   LIMA to LAD, SVG to D1, Sequential SVG to PDA and RPL2, EVH via right thigh and leg   Past Surgical History:  Procedure Laterality Date   CARDIAC CATHETERIZATION  05/17/2014   DR HARDING: 95% proximal LAD, 90% ramus intermedius, 95% mid circumflex after OM1, neither percent RPDA   CORONARY ARTERY BYPASS GRAFT N/A 05/20/2014   Procedure: CORONARY ARTERY BYPASS GRAFTING (CABG) x 4,  LIMA-MID LAD,  SVG-DIAGONAL,  SEQUENTIAL SVG -PDA, PLB #2;  Surgeon: Sudie VEAR Laine, MD;  Location: [facility] OR;  Service: Open Heart Surgery;  Laterality: N/A;   INTRAOPERATIVE TRANSESOPHAGEAL ECHOCARDIOGRAM N/A 05/20/2014   Procedure: INTRAOPERATIVE TRANSESOPHAGEAL ECHOCARDIOGRAM;  Surgeon: Sudie VEAR Laine, MD;  Location: [facility] OR;  Service: Open Heart Surgery;  Laterality: N/A;   LEFT HEART CATHETERIZATION WITH CORONARY ANGIOGRAM  N/A 05/18/2014   Procedure: LEFT HEART CATHETERIZATION WITH CORONARY ANGIOGRAM;  Surgeon: Alm LELON Clay, MD;  Location: [facility] CATH LAB;  Service: Cardiovascular;  Laterality: N/A;   TRANSTHORACIC ECHOCARDIOGRAM  09/14/2014   Normal EF 55-60% with mild Conc LVH. No regional WMA, essentially normal   FAMILY HISTORY Family History  Problem Relation Age of Onset   Heart disease Brother        lives in [place]    Heart attack Neg Hx    Stroke Neg Hx    SOCIAL HISTORY Social  History   Tobacco Use   Smoking status: Never   Smokeless tobacco: Never  Vaping Use   Vaping status: Never Used  Substance Use Topics   Alcohol use: Never   Drug use: Never       OPHTHALMIC EXAM: Base Eye Exam     Visual Acuity (Snellen - Linear)       Right Left   Dist [place] 20/30 -2 20/40   Dist ph [place] 20/20 -1 20/30         Tonometry (Tonopen, 9:22 AM)       Right Left   Pressure 13 13         Pupils       Pupils Dark Light Shape React APD   Right PERRL 3 2 Round Brisk None   Left PERRL 3 2 Round Brisk None         Visual Fields       Left Right    Full Full         Extraocular Movement       Right Left    Full, Ortho Full, Ortho         Neuro/Psych     Oriented x3: Yes   Mood/Affect: Normal         Dilation     Both eyes: 2.5% Phenylephrine  @ 9:22 AM           Slit Lamp and Fundus Exam     Slit Lamp Exam       Right Left   Lids/Lashes Dermatochalasis - upper lid Dermatochalasis - upper lid   Conjunctiva/Sclera White and quiet Mild melanosis   Cornea Arcus, Well healed cataract wound Arcus, Well healed cataract wound   Anterior Chamber Deep and quiet Deep and quiet   Iris Round and dilated round and poorly dilated   Lens PCIOL in good position PCIOL in good position   Anterior Vitreous Vitreous syneresis, PVD, vitreous condensations Vitreous syneresis, Posterior vitreous detachment, vitreous condensations, Weiss ring         Fundus Exam       Right Left   Disc mild pallor, sharp rim, mild PPA Sharp rim, mild temporal Pallor   C/D Ratio 0.3 0.4   Macula Flat, Good foveal reflex, RPE mottling, no heme or edema Good foveal reflex, persistent cystic changes -- IRH/CWS superior and temporal macula -- stably improved, +ERM SN macula   Vessels attenuated, Tortuous attenuated, Tortuous, +sclerosis ST arcades   Periphery Attached, no heme Attached, IRH superior midzone (extension of ST BRVO) -- stably improved            IMAGING AND PROCEDURES  Imaging and Procedures for 12/31/2023  OCT, Retina - OU - Both Eyes       Right Eye Quality was good. Central Foveal Thickness: 266. Progression has been stable. Findings include normal foveal contour, no IRF, no SRF, retinal drusen .   Left Eye Quality was good.  Central Foveal Thickness: 314. Progression has been stable. Findings include no SRF, abnormal foveal contour, intraretinal hyper-reflective material, cystoid macular edema, epiretinal membrane, intraretinal fluid, inner retinal atrophy (persistent cystic changes/IRF perifoveal, stable IRA ST macula; persistent ERM with blunting of foveal contour).   Notes *Images captured and stored on drive  Diagnosis / Impression:  OD: NFP, no IRF/SRF OS: persistent cystic changes/IRF perifoveal, stable IRA ST macula; persistent ERM with blunting of foveal contour  Clinical management:  See below  Abbreviations: NFP - Normal foveal profile. CME - cystoid macular edema. PED - pigment epithelial detachment. IRF - intraretinal fluid. SRF - subretinal fluid. EZ - ellipsoid zone. ERM - epiretinal membrane. ORA - outer retinal atrophy. ORT - outer retinal tubulation. SRHM - subretinal hyper-reflective material. IRHM - intraretinal hyper-reflective material      Intravitreal Injection, Pharmacologic Agent - OS - Left Eye       Time Out 12/31/2023. 9:49 AM. Confirmed correct patient, procedure, site, and patient consented.   Anesthesia Topical anesthesia was used. Anesthetic medications included Lidocaine  2%, Proparacaine 0.5%.   Procedure Preparation included 5% betadine to ocular surface, eyelid speculum. A (32g) needle was used.   Injection: 2 mg aflibercept  2 MG/0.05ML   Route: Intravitreal, Site: Left Eye   NDC: [id number], Lot: [id number], Expiration date: 04/28/2025, Waste: 0 mL   Post-op Post injection exam found visual acuity of at least counting fingers. The patient tolerated the procedure well.  There were no complications. The patient received written and verbal post procedure care education. Post injection medications were not given.            ASSESSMENT/PLAN:    ICD-10-CM   1. Branch retinal vein occlusion of left eye with macular edema  H34.8320 OCT, Retina - OU - Both Eyes    Intravitreal Injection, Pharmacologic Agent - OS - Left Eye    aflibercept  (EYLEA ) SOLN 2 mg    2. Essential hypertension  I10     3. Hypertensive retinopathy of both eyes  H35.033     4. Epiretinal membrane (ERM) of left eye  H35.372     5. Pseudophakia of both eyes  Z96.1       1. BRVO w/ CME OS - s/p IVA OS #1 (9.21.22), #2 (10.19.22), #3 (11.16.22), #4 (12.28.22), #5 (02.22.23), #6 (06.07.23), #7 (07.26.23), #8 (09.13.23), #9 (10.18.23) -- IVA resistance - s/p IVE OS #1 (11.22.23), #2 (01.02.24), #3 (02.14.24), #4 (03.20.24), #5 (04.24.24), #5 (05.29.24), #6 (07.09.24), #7 (08.13.24), #9 (09.10.24), #10 (10.08.24), #11 (11.05.24),  #12 (12.03.24) - pt was first seen on 8.10.22, but could not be treated with intravitreal injection because BP was extremely high R arm (199/122) and L arm (186/110) - pt was referred back to PCP, who has since restarted BP meds - FA (05.29.24) shows OS: Severe vascular attenuation ST quad with capillary drop out and +telangiectasias, prominent dilated and tortuous venule off nasal disc, no NV - BCVA OS 20/30 -- improved from 20/40 - OCT shows OS: persistent cystic changes/IRF perifoveal, stable IRA ST macula; persistent ERM with blunting of foveal contour at 4 weeks - IRA suggestive of BRAO component **history of increased IRF at 5 wks, noted on 08.13.24** **history of increase in IRF at 8 weeks, noted on 04.19.23 OCT** **history of increase in IRF at 7 weeks, noted on 09.13.23 OCT** - recommend IVE OS #13 today, 12.31.24 w/ f/u in 4 wks again - pt wishes to proceed with injection of IVE - RBA of procedure discussed, questions answered -  IVE informed consent  obtained and signed, 11.05.24 (OS) - see procedure note - f/u in 4 weeks, DFE/OCT, possible injection  2,3. Hypertensive retinopathy OU - BPs at presentation (8.10.22) were extremely high -- R arm (199/122) and L arm (186/110)  - pt was referred back to PCP, who has since restarted BP meds - discussed importance of tight BP control.  4. Epiretinal membrane, left eye  - mild ERM - BCVA 20/40 -- stable - asymptomatic, no metamorphopsia - no indication for surgery at this time - continue to monitor  5. Pseudophakia OU  - s/p CE/IOL OU (S. [place])  - IOLs in good position, doing well  - continue to monitor  Ophthalmic Meds Ordered this visit:  Meds ordered this encounter  Medications   aflibercept  (EYLEA ) SOLN 2 mg     Return in about 4 weeks (around 01/28/2024) for f/u BRVO OS , DFE, OCT, Possible, IVE, OS.  There are no Patient Instructions on file for this visit.  This document serves as a record of services personally performed by Redell JUDITHANN Hans, MD, PhD. It was created on their behalf by Wanda Keens, COT an ophthalmic technician. The creation of this record is the provider's dictation and/or activities during the visit.    Electronically signed by:  Wanda GEANNIE Keens, COT  01/01/24 2:08 AM  Redell JUDITHANN Hans, M.D., Ph.D. Diseases & Surgery of the Retina and Vitreous [facility] [place]  I have reviewed the above documentation for accuracy and completeness, and I agree with the above. Redell JUDITHANN Hans, M.D., Ph.D. 01/01/24 2:09 AM   Abbreviations: M myopia (nearsighted); A astigmatism; H hyperopia (farsighted); P presbyopia; Mrx spectacle prescription;  CTL contact lenses; OD right eye; OS left eye; OU both eyes  XT exotropia; ET esotropia; PEK punctate epithelial keratitis; PEE punctate epithelial erosions; DES dry eye syndrome; MGD meibomian gland dysfunction; ATs artificial tears; PFAT's preservative free artificial tears; NSC nuclear sclerotic cataract;  PSC posterior subcapsular cataract; ERM epi-retinal membrane; PVD posterior vitreous detachment; RD retinal detachment; DM diabetes mellitus; DR diabetic retinopathy; NPDR non-proliferative diabetic retinopathy; PDR proliferative diabetic retinopathy; CSME clinically significant macular edema; DME diabetic macular edema; dbh dot blot hemorrhages; CWS cotton wool spot; POAG primary open angle glaucoma; C/D cup-to-disc ratio; HVF humphrey visual field; GVF goldmann visual field; OCT optical coherence tomography; IOP intraocular pressure; BRVO Branch retinal vein occlusion; CRVO central retinal vein occlusion; CRAO central retinal artery occlusion; BRAO branch retinal artery occlusion; RT retinal tear; SB scleral buckle; PPV pars plana vitrectomy; VH Vitreous hemorrhage; PRP panretinal laser photocoagulation; IVK intravitreal kenalog; VMT vitreomacular traction; MH Macular hole;  NVD neovascularization of the disc; NVE neovascularization elsewhere; AREDS age related eye disease study; ARMD age related macular degeneration; POAG primary open angle glaucoma; EBMD epithelial/anterior basement membrane dystrophy; ACIOL anterior chamber intraocular lens; IOL intraocular lens; PCIOL posterior chamber intraocular lens; Phaco/IOL phacoemulsification with intraocular lens placement; PRK photorefractive keratectomy; LASIK laser assisted in situ keratomileusis; HTN hypertension; DM diabetes mellitus; COPD chronic obstructive pulmonary disease

## 2023-12-31 ENCOUNTER — Ambulatory Visit (INDEPENDENT_AMBULATORY_CARE_PROVIDER_SITE_OTHER): Payer: 59 | Admitting: Ophthalmology

## 2023-12-31 ENCOUNTER — Encounter (INDEPENDENT_AMBULATORY_CARE_PROVIDER_SITE_OTHER): Payer: Self-pay | Admitting: Ophthalmology

## 2023-12-31 DIAGNOSIS — H35033 Hypertensive retinopathy, bilateral: Secondary | ICD-10-CM | POA: Diagnosis not present

## 2023-12-31 DIAGNOSIS — H34832 Tributary (branch) retinal vein occlusion, left eye, with macular edema: Secondary | ICD-10-CM

## 2023-12-31 DIAGNOSIS — H35372 Puckering of macula, left eye: Secondary | ICD-10-CM

## 2023-12-31 DIAGNOSIS — Z961 Presence of intraocular lens: Secondary | ICD-10-CM

## 2023-12-31 DIAGNOSIS — I10 Essential (primary) hypertension: Secondary | ICD-10-CM | POA: Diagnosis not present

## 2023-12-31 MED ORDER — AFLIBERCEPT 2MG/0.05ML IZ SOLN FOR KALEIDOSCOPE
2.0000 mg | INTRAVITREAL | Status: AC | PRN
  Administered 2023-12-31: 2 mg via INTRAVITREAL

## 2024-01-16 NOTE — Progress Notes (Signed)
[facility] - Clinic Note  01/28/2024    CHIEF COMPLAINT Patient presents for Retina Follow Up  HISTORY OF PRESENT ILLNESS: Roger Howe is a 68 y.o. male who presents to the clinic today for:   HPI     Retina Follow Up   Patient presents with  CRVO/BRVO.  In left eye.  This started 4 weeks ago.  Duration of 4 weeks.  Since onset it is gradually improving.  I, the attending physician,  performed the HPI with the patient and updated documentation appropriately.        Comments   4 week retina follow up BRVO OS and I'VE OS pt is reporting no vision changes noticed maybe little better he denies any flashes or floaters       Last edited by [name], MD on 01/28/2024  4:47 PM.     Referring physician: [name], MD [address] [place],  [place] [address]  HISTORICAL INFORMATION:  Selected notes from the MEDICAL RECORD NUMBER Referred by Dr. [name] for eval of BRVO OS   CURRENT MEDICATIONS: No current outpatient medications on file. (Ophthalmic Drugs)   No current facility-administered medications for this visit. (Ophthalmic Drugs)   Current Outpatient Medications (Other)  Medication Sig   aspirin 81 MG chewable tablet Chew 81 mg by mouth daily.   atorvastatin (LIPITOR) 20 MG tablet Take 20 mg by mouth daily at 6 PM.   carvedilol (COREG) 3.125 MG tablet TAKE 1 TABLET BY MOUTH TWICE A DAY *PLS CALL OFFICE FOR ADDITIONAL REFILLS*   fenofibrate micronized (LOFIBRA) 134 MG capsule Take 134 mg by mouth daily.   lisinopril-hydrochlorothiazide (ZESTORETIC) 10-12.5 MG tablet Take 1 tablet by mouth daily.   metoprolol succinate (TOPROL-XL) 25 MG 24 hr tablet Take 25 mg by mouth daily.   No current facility-administered medications for this visit. (Other)   REVIEW OF SYSTEMS: ROS   Positive for: Cardiovascular, Eyes Negative for: Constitutional, Gastrointestinal, Neurological, Skin, Genitourinary, Musculoskeletal, HENT, Endocrine, Respiratory,  Psychiatric, Allergic/Imm, Heme/Lymph Last edited by Etheleen Mayhew, COT on 01/28/2024  9:11 AM.       ALLERGIES No Known Allergies  PAST MEDICAL HISTORY Past Medical History:  Diagnosis Date   Abdominal aortic atherosclerosis (HCC)    Seen on catheterization   CAD, multiple vessel 05/18/2014   Coronary artery disease    Essential hypertension    Hypertensive retinopathy    NSTEMI (non-ST elevated myocardial infarction) (HCC) 05/17/2014   Post CABG Echo 09/19/2014: Normal EF 55-60% with mild Conc LVH. 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   LIMA to LAD, SVG to D1, Sequential SVG to PDA and RPL2, EVH via right thigh and leg   Past Surgical History:  Procedure Laterality Date   CARDIAC CATHETERIZATION  05/17/2014   DR HARDING: 95% proximal LAD, 90% ramus intermedius, 95% mid circumflex after OM1, neither percent RPDA   CORONARY ARTERY BYPASS GRAFT N/A 05/20/2014   Procedure: CORONARY ARTERY BYPASS GRAFTING (CABG) x 4,  LIMA-MID LAD,  SVG-DIAGONAL,  SEQUENTIAL SVG -PDA, PLB #2;  Surgeon: [name], MD;  Location: [facility] OR;  Service: Open Heart Surgery;  Laterality: N/A;   INTRAOPERATIVE TRANSESOPHAGEAL ECHOCARDIOGRAM N/A 05/20/2014   Procedure: INTRAOPERATIVE TRANSESOPHAGEAL ECHOCARDIOGRAM;  Surgeon: [name], MD;  Location: [facility] OR;  Service: Open Heart Surgery;  Laterality: N/A;   LEFT HEART CATHETERIZATION WITH CORONARY ANGIOGRAM N/A 05/18/2014   Procedure: LEFT HEART CATHETERIZATION WITH CORONARY  [name];  Surgeon: [name], MD;  Location: [facility] CATH LAB;  Service: Cardiovascular;  Laterality: N/A;   TRANSTHORACIC ECHOCARDIOGRAM  09/14/2014   Normal EF 55-60% with mild Conc LVH. No regional WMA, essentially normal   FAMILY HISTORY Family History  Problem Relation Age of Onset   Heart disease Brother        lives in [place]   Heart attack Neg Hx    Stroke Neg Hx    SOCIAL HISTORY Social History   Tobacco Use   Smoking status: Never    Smokeless tobacco: Never  Vaping Use   Vaping status: Never Used  Substance Use Topics   Alcohol use: Never   Drug use: Never       OPHTHALMIC EXAM: Base Eye Exam     Visual Acuity (Snellen - Linear)       Right Left   Dist [place] 20/40 20/40 -2   Dist ph [place] NI NI         Tonometry (Tonopen, 9:18 AM)       Right Left   Pressure 17 18         Pupils       Pupils Dark Light Shape React APD   Right PERRL 3 2 Round Brisk None   Left PERRL 3 2 Round Brisk None         Visual Fields       Left Right    Full Full         Extraocular Movement       Right Left    Full, Ortho Full, Ortho         Neuro/Psych     Oriented x3: Yes   Mood/Affect: Normal         Dilation     Both eyes: 2.5% Phenylephrine @ 9:19 AM           Slit Lamp and Fundus Exam     Slit Lamp Exam       Right Left   Lids/Lashes Dermatochalasis - upper lid Dermatochalasis - upper lid   Conjunctiva/Sclera White and quiet Mild melanosis   Cornea Arcus, Well healed cataract wound Arcus, Well healed cataract wound   Anterior Chamber Deep and quiet Deep and quiet   Iris Round and dilated round and poorly dilated   Lens PCIOL in good position PCIOL in good position   Anterior Vitreous Vitreous syneresis, PVD, vitreous condensations Vitreous syneresis, Posterior vitreous detachment, vitreous condensations, Weiss ring         Fundus Exam       Right Left   Disc mild pallor, sharp rim, mild PPA Sharp rim, mild temporal Pallor   C/D Ratio 0.3 0.4   Macula Flat, Good foveal reflex, RPE mottling, no heme or edema Good foveal reflex, persistent cystic changes -- IRH/CWS superior and temporal macula -- stably improved, +ERM SN macula   Vessels attenuated, Tortuous attenuated, Tortuous, +sclerosis ST arcades   Periphery Attached, no heme Attached, IRH superior midzone (extension of ST BRVO) -- stably improved           IMAGING AND PROCEDURES  Imaging and Procedures for  01/28/2024  OCT, Retina - OU - Both Eyes       Right Eye Quality was good. Central Foveal Thickness: 268. Progression has been stable. Findings include normal foveal contour, no IRF, no SRF, retinal drusen .   Left Eye Quality was good. Central Foveal Thickness: 314. Progression has been stable. Findings include no  SRF, abnormal foveal contour, intraretinal hyper-reflective material, cystoid macular edema, epiretinal membrane, intraretinal fluid, inner retinal atrophy (persistent cystic changes/IRF perifoveal, stable IRA ST macula; persistent ERM with blunting of foveal contour).   Notes *Images captured and stored on drive  Diagnosis / Impression:  OD: NFP, no IRF/SRF OS: persistent cystic changes/IRF perifoveal, stable IRA ST macula; persistent ERM with blunting of foveal contour  Clinical management:  See below  Abbreviations: NFP - Normal foveal profile. CME - cystoid macular edema. PED - pigment epithelial detachment. IRF - intraretinal fluid. SRF - subretinal fluid. EZ - ellipsoid zone. ERM - epiretinal membrane. ORA - outer retinal atrophy. ORT - outer retinal tubulation. SRHM - subretinal hyper-reflective material. IRHM - intraretinal hyper-reflective material      Intravitreal Injection, Pharmacologic Agent - OS - Left Eye       Time Out 01/28/2024. 10:15 AM. Confirmed correct patient, procedure, site, and patient consented.   Anesthesia Topical anesthesia was used. Anesthetic medications included Lidocaine 2%, Proparacaine 0.5%.   Procedure Preparation included 5% betadine to ocular surface, eyelid speculum. A (32g) needle was used.   Injection: 2 mg aflibercept 2 MG/0.05ML   Route: Intravitreal, Site: Left Eye   NDC: L6038910, Lot: [id number], Expiration date: 04/29/2025, Waste: 0 mL   Post-op Post injection exam found visual acuity of at least counting fingers. The patient tolerated the procedure well. There were no complications. The patient received written  and verbal post procedure care education. Post injection medications were not given.            ASSESSMENT/PLAN:    ICD-10-CM   1. Branch retinal vein occlusion of left eye with macular edema  H34.8320 OCT, Retina - OU - Both Eyes    Intravitreal Injection, Pharmacologic Agent - OS - Left Eye    aflibercept (EYLEA) SOLN 2 mg    2. Essential hypertension  I10     3. Hypertensive retinopathy of both eyes  H35.033     4. Epiretinal membrane (ERM) of left eye  H35.372     5. Pseudophakia of both eyes  Z96.1      1. BRVO w/ CME OS - s/p IVA OS #1 (9.21.22), #2 (10.19.22), #3 (11.16.22), #4 (12.28.22), #5 (02.22.23), #6 (06.07.23), #7 (07.26.23), #8 (09.13.23), #9 (10.18.23) -- IVA resistance - s/p IVE OS #1 (11.22.23), #2 (01.02.24), #3 (02.14.24), #4 (03.20.24), #5 (04.24.24), #5 (05.29.24), #6 (07.09.24), #7 (08.13.24), #9 (09.10.24), #10 (10.08.24), #11 (11.05.24),  #12 (12.03.24), #13 (12.31.24) - pt was first seen on 8.10.22, but could not be treated with intravitreal injection because BP was extremely high R arm (199/122) and L arm (186/110) - pt was referred back to PCP, who has since restarted BP meds - FA (05.29.24) shows OS: Severe vascular attenuation ST quad with capillary drop out and +telangiectasias, prominent dilated and tortuous venule off nasal disc, no NV - BCVA OS 20/40 (slightly decreased from 20/30) - OCT shows OS: persistent cystic changes/IRF perifoveal, stable IRA ST macula; persistent ERM with blunting of foveal contour at 4 weeks - IRA suggestive of BRAO component( **history of increased IRF at 5 wks, noted on 08.13.24** **history of increase in IRF at 8 weeks, noted on 04.19.23 OCT** **history of increase in IRF at 7 weeks, noted on 09.13.23 OCT** - recommend IVE OS #14 today, 1.28.25 w/ f/u in 4 wks again - pt wishes to proceed with injection of IVE - RBA of procedure discussed, questions answered - IVE informed consent obtained and signed, 11.05.24  (OS) -  see procedure note - f/u in 4 weeks, DFE/OCT, possible injection  2,3. Hypertensive retinopathy OU - BPs at presentation (8.10.22) were extremely high -- R arm (199/122) and L arm (186/110)  - pt was referred back to PCP, who has since restarted BP meds - discussed importance of tight BP control.  4. Epiretinal membrane, left eye  - mild ERM - BCVA 20/40 -- stable - asymptomatic, no metamorphopsia - no indication for surgery at this time - continue to monitor  5. Pseudophakia OU  - s/p CE/IOL OU (S. [place])  - IOLs in good position, doing well  - continue to monitor  Ophthalmic Meds Ordered this visit:  Meds ordered this encounter  Medications   aflibercept (EYLEA) SOLN 2 mg     Return in about 4 weeks (around 02/25/2024) for BRVO OS - DFE, OCT, Possible Injxn.  There are no Patient Instructions on file for this visit.  This document serves as a record of services personally performed by Karie Chimera, MD, PhD. It was created on their behalf by Laurey Morale, COT an ophthalmic technician. The creation of this record is the provider's dictation and/or activities during the visit.    Electronically signed by:  Charlette Caffey, COT  01/30/24 12:07 PM  This document serves as a record of services personally performed by Karie Chimera, MD, PhD. It was created on their behalf by Annalee Genta, COMT. The creation of this record is the provider's dictation and/or activities during the visit.  Electronically signed by: Annalee Genta, COMT 01/30/24 12:07 PM  Karie Chimera, M.D., Ph.D. Diseases & Surgery of the Retina and Vitreous [facility]  I have reviewed the above documentation for accuracy and completeness, and I agree with the above. Karie Chimera, M.D., Ph.D. 01/30/24 12:08 PM    Abbreviations: M myopia (nearsighted); A astigmatism; H hyperopia (farsighted); P presbyopia; Mrx spectacle prescription;  CTL contact lenses; OD right  eye; OS left eye; OU both eyes  XT exotropia; ET esotropia; PEK punctate epithelial keratitis; PEE punctate epithelial erosions; DES dry eye syndrome; MGD meibomian gland dysfunction; ATs artificial tears; PFAT's preservative free artificial tears; NSC nuclear sclerotic cataract; PSC posterior subcapsular cataract; ERM epi-retinal membrane; PVD posterior vitreous detachment; RD retinal detachment; DM diabetes mellitus; DR diabetic retinopathy; NPDR non-proliferative diabetic retinopathy; PDR proliferative diabetic retinopathy; CSME clinically significant macular edema; DME diabetic macular edema; dbh dot blot hemorrhages; CWS cotton wool spot; POAG primary open angle glaucoma; C/D cup-to-disc ratio; HVF humphrey visual field; GVF goldmann visual field; OCT optical coherence tomography; IOP intraocular pressure; BRVO Branch retinal vein occlusion; CRVO central retinal vein occlusion; CRAO central retinal artery occlusion; BRAO branch retinal artery occlusion; RT retinal tear; SB scleral buckle; PPV pars plana vitrectomy; VH Vitreous hemorrhage; PRP panretinal laser photocoagulation; IVK intravitreal kenalog; VMT vitreomacular traction; MH Macular hole;  NVD neovascularization of the disc; NVE neovascularization elsewhere; AREDS age related eye disease study; ARMD age related macular degeneration; POAG primary open angle glaucoma; EBMD epithelial/anterior basement membrane dystrophy; ACIOL anterior chamber intraocular lens; IOL intraocular lens; PCIOL posterior chamber intraocular lens; Phaco/IOL phacoemulsification with intraocular lens placement; PRK photorefractive keratectomy; LASIK laser assisted in situ keratomileusis; HTN hypertension; DM diabetes mellitus; COPD chronic obstructive pulmonary disease

## 2024-01-28 ENCOUNTER — Ambulatory Visit (INDEPENDENT_AMBULATORY_CARE_PROVIDER_SITE_OTHER): Payer: 59 | Admitting: Ophthalmology

## 2024-01-28 ENCOUNTER — Encounter (INDEPENDENT_AMBULATORY_CARE_PROVIDER_SITE_OTHER): Payer: Self-pay | Admitting: Ophthalmology

## 2024-01-28 DIAGNOSIS — H34832 Tributary (branch) retinal vein occlusion, left eye, with macular edema: Secondary | ICD-10-CM

## 2024-01-28 DIAGNOSIS — H35033 Hypertensive retinopathy, bilateral: Secondary | ICD-10-CM

## 2024-01-28 DIAGNOSIS — I10 Essential (primary) hypertension: Secondary | ICD-10-CM

## 2024-01-28 DIAGNOSIS — H35372 Puckering of macula, left eye: Secondary | ICD-10-CM | POA: Diagnosis not present

## 2024-01-28 DIAGNOSIS — Z961 Presence of intraocular lens: Secondary | ICD-10-CM

## 2024-01-28 MED ORDER — AFLIBERCEPT 2MG/0.05ML IZ SOLN FOR KALEIDOSCOPE
2.0000 mg | INTRAVITREAL | Status: AC | PRN
  Administered 2024-01-28: 2 mg via INTRAVITREAL

## 2024-02-12 NOTE — Progress Notes (Signed)
 [facility] - Clinic Note  02/25/2024    CHIEF COMPLAINT Patient presents for Retina Follow Up  HISTORY OF PRESENT ILLNESS: Roger Howe is a 68 y.o. male who presents to the clinic today for:   HPI     Retina Follow Up   Patient presents with  CRVO/BRVO.  In left eye.  This started 4 weeks ago.  Duration of 4 weeks.  Since onset it is stable.  I, the attending physician,  performed the HPI with the patient and updated documentation appropriately.        Comments   4 week retina follow up BRVO and IVE OS pt is reporting vision seems little better but it comes and goes he denies any flashes and is seeing some floaters       Last edited by [name], MD on 02/25/2024 12:12 PM.     Referring physician: [name], MD [address] [place],  [place] [address]  HISTORICAL INFORMATION:  Selected notes from the MEDICAL RECORD NUMBER Referred by Dr. [name] for eval of BRVO OS   CURRENT MEDICATIONS: No current outpatient medications on file. (Ophthalmic Drugs)   No current facility-administered medications for this visit. (Ophthalmic Drugs)   Current Outpatient Medications (Other)  Medication Sig   aspirin 81 MG chewable tablet Chew 81 mg by mouth daily.   atorvastatin (LIPITOR) 20 MG tablet Take 20 mg by mouth daily at 6 PM.   carvedilol (COREG) 3.125 MG tablet TAKE 1 TABLET BY MOUTH TWICE A DAY *PLS CALL OFFICE FOR ADDITIONAL REFILLS*   fenofibrate micronized (LOFIBRA) 134 MG capsule Take 134 mg by mouth daily.   lisinopril-hydrochlorothiazide (ZESTORETIC) 10-12.5 MG tablet Take 1 tablet by mouth daily.   metoprolol succinate (TOPROL-XL) 25 MG 24 hr tablet Take 25 mg by mouth daily.   No current facility-administered medications for this visit. (Other)   REVIEW OF SYSTEMS: ROS   Positive for: Cardiovascular, Eyes Negative for: Constitutional, Gastrointestinal, Neurological, Skin, Genitourinary, Musculoskeletal, HENT, Endocrine,  Respiratory, Psychiatric, Allergic/Imm, Heme/Lymph Last edited by Etheleen Mayhew, COT on 02/25/2024  9:09 AM.     ALLERGIES No Known Allergies  PAST MEDICAL HISTORY Past Medical History:  Diagnosis Date   Abdominal aortic atherosclerosis (HCC)    Seen on catheterization   CAD, multiple vessel 05/18/2014   Coronary artery disease    Essential hypertension    Hypertensive retinopathy    NSTEMI (non-ST elevated myocardial infarction) (HCC) 05/17/2014   Post CABG Echo 09/19/2014: Normal EF 55-60% with mild Conc LVH. 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   LIMA to LAD, SVG to D1, Sequential SVG to PDA and RPL2, EVH via right thigh and leg   Past Surgical History:  Procedure Laterality Date   CARDIAC CATHETERIZATION  05/17/2014   DR HARDING: 95% proximal LAD, 90% ramus intermedius, 95% mid circumflex after OM1, neither percent RPDA   CORONARY ARTERY BYPASS GRAFT N/A 05/20/2014   Procedure: CORONARY ARTERY BYPASS GRAFTING (CABG) x 4,  LIMA-MID LAD,  SVG-DIAGONAL,  SEQUENTIAL SVG -PDA, PLB #2;  Surgeon: [name], MD;  Location: [facility] OR;  Service: Open Heart Surgery;  Laterality: N/A;   INTRAOPERATIVE TRANSESOPHAGEAL ECHOCARDIOGRAM N/A 05/20/2014   Procedure: INTRAOPERATIVE TRANSESOPHAGEAL ECHOCARDIOGRAM;  Surgeon: [name], MD;  Location: [facility] OR;  Service: Open Heart Surgery;  Laterality: N/A;   LEFT HEART CATHETERIZATION WITH CORONARY ANGIOGRAM N/A 05/18/2014   Procedure: LEFT HEART CATHETERIZATION WITH CORONARY  Rosalin Howe;  Surgeon: [name], MD;  Location: [facility] CATH LAB;  Service: Cardiovascular;  Laterality: N/A;   TRANSTHORACIC ECHOCARDIOGRAM  09/14/2014   Normal EF 55-60% with mild Conc LVH. No regional WMA, essentially normal   FAMILY HISTORY Family History  Problem Relation Age of Onset   Heart disease Brother        lives in [place]   Heart attack Neg Hx    Stroke Neg Hx    SOCIAL HISTORY Social History   Tobacco Use   Smoking  status: Never   Smokeless tobacco: Never  Vaping Use   Vaping status: Never Used  Substance Use Topics   Alcohol use: Never   Drug use: Never       OPHTHALMIC EXAM: Base Eye Exam     Visual Acuity (Snellen - Linear)       Right Left   Dist [place] 20/40 20/50 -1   Dist ph [place] 20/30 -2 20/40 -1         Tonometry (Tonopen, 9:16 AM)       Right Left   Pressure 16 17         Pupils       Pupils Dark Light Shape React APD   Right PERRL 3 2 Round Brisk None   Left PERRL 3 2 Round Brisk None         Visual Fields       Left Right    Full Full         Extraocular Movement       Right Left    Full, Ortho Full, Ortho         Neuro/Psych     Oriented x3: Yes   Mood/Affect: Normal         Dilation     Both eyes: 2.5% Phenylephrine @ 9:17 AM           Slit Lamp and Fundus Exam     Slit Lamp Exam       Right Left   Lids/Lashes Dermatochalasis - upper lid Dermatochalasis - upper lid   Conjunctiva/Sclera White and quiet Mild melanosis   Cornea Arcus, Well healed cataract wound Arcus, Well healed cataract wound   Anterior Chamber Deep and quiet Deep and quiet   Iris Round and dilated round and poorly dilated   Lens PCIOL in good position PCIOL in good position   Anterior Vitreous Vitreous syneresis, PVD, vitreous condensations Vitreous syneresis, Posterior vitreous detachment, vitreous condensations, Weiss ring         Fundus Exam       Right Left   Disc mild pallor, sharp rim, mild PPA Sharp rim, mild temporal Pallor   C/D Ratio 0.3 0.4   Macula Flat, Good foveal reflex, RPE mottling, no heme or edema Good foveal reflex, trace cystic changes, IRH/CWS superior and temporal macula -- stably improved, +ERM SN macula   Vessels attenuated, Tortuous attenuated, Tortuous, +sclerosis ST arcades   Periphery Attached, no heme Attached, IRH superior midzone (extension of ST BRVO) -- improved, punctate exudates nasal to disc           IMAGING AND  PROCEDURES  Imaging and Procedures for 02/25/2024  OCT, Retina - OU - Both Eyes       Right Eye Quality was good. Central Foveal Thickness: 270. Progression has been stable. Findings include normal foveal contour, no IRF, no SRF, retinal drusen .   Left Eye Quality was good. Central Foveal Thickness: 311. Progression has  been stable. Findings include no SRF, abnormal foveal contour, intraretinal hyper-reflective material, cystoid macular edema, epiretinal membrane, intraretinal fluid, inner retinal atrophy (Trace persistent cystic changes/IRF perifovea, stable IRA ST macula; persistent ERM with blunting of foveal contour).   Notes *Images captured and stored on drive  Diagnosis / Impression:  OD: NFP, no IRF/SRF OS: trace persistent cystic changes/IRF perifovea, stable IRA ST macula; persistent ERM with blunting of foveal contour  Clinical management:  See below  Abbreviations: NFP - Normal foveal profile. CME - cystoid macular edema. PED - pigment epithelial detachment. IRF - intraretinal fluid. SRF - subretinal fluid. EZ - ellipsoid zone. ERM - epiretinal membrane. ORA - outer retinal atrophy. ORT - outer retinal tubulation. SRHM - subretinal hyper-reflective material. IRHM - intraretinal hyper-reflective material      Intravitreal Injection, Pharmacologic Agent - OS - Left Eye       Time Out 02/25/2024. 10:41 AM. Confirmed correct patient, procedure, site, and patient consented.   Anesthesia Topical anesthesia was used. Anesthetic medications included Lidocaine 2%, Proparacaine 0.5%.   Procedure Preparation included 5% betadine to ocular surface, eyelid speculum. A (32g) needle was used.   Injection: 2 mg aflibercept 2 MG/0.05ML   Route: Intravitreal, Site: Left Eye   NDC: L6038910, Lot: [id number], Expiration date: 01/30/2025, Waste: 0 mL   Post-op Post injection exam found visual acuity of at least counting fingers. The patient tolerated the procedure well. There  were no complications. The patient received written and verbal post procedure care education. Post injection medications were not given.            ASSESSMENT/PLAN:    ICD-10-CM   1. Branch retinal vein occlusion of left eye with macular edema  H34.8320 OCT, Retina - OU - Both Eyes    Intravitreal Injection, Pharmacologic Agent - OS - Left Eye    aflibercept (EYLEA) SOLN 2 mg    2. Essential hypertension  I10     3. Hypertensive retinopathy of both eyes  H35.033     4. Epiretinal membrane (ERM) of left eye  H35.372     5. Pseudophakia of both eyes  Z96.1      1. BRVO w/ CME OS - s/p IVA OS #1 (9.21.22), #2 (10.19.22), #3 (11.16.22), #4 (12.28.22), #5 (02.22.23), #6 (06.07.23), #7 (07.26.23), #8 (09.13.23), #9 (10.18.23) -- IVA resistance - s/p IVE OS #1 (11.22.23), #2 (01.02.24), #3 (02.14.24), #4 (03.20.24), #5 (04.24.24), #5 (05.29.24), #6 (07.09.24), #7 (08.13.24), #9 (09.10.24), #10 (10.08.24), #11 (11.05.24),  #12 (12.03.24), #13 (12.31.24), #14 (01.28.25) - pt was first seen on 8.10.22, but could not be treated with intravitreal injection because BP was extremely high R arm (199/122) and L arm (186/110) - pt was referred back to PCP, who has since restarted BP meds - FA (05.29.24) shows OS: Severe vascular attenuation ST quad with capillary drop out and +telangiectasias, prominent dilated and tortuous venule off nasal disc, no NV - BCVA OS 20/40 (stable) - OCT shows OS: persistent cystic changes/IRF perifoveal, stable IRA ST macula; persistent ERM with blunting of foveal contour at 4 weeks - IRA suggestive of BRAO component( **history of increased IRF at 5 wks, noted on 08.13.24** **history of increase in IRF at 8 weeks, noted on 04.19.23 OCT** **history of increase in IRF at 7 weeks, noted on 09.13.23 OCT** - recommend IVE OS #15 today, 02.25.25 w/ f/u ext to 5 wks - pt wishes to proceed with injection of IVE - RBA of procedure discussed, questions answered - IVE informed  consent obtained and signed, 11.05.24 (OS) - see procedure note - f/u in 5 weeks, DFE/OCT, possible injection  2,3. Hypertensive retinopathy OU - BPs at presentation (8.10.22) were extremely high -- R arm (199/122) and L arm (186/110)  - pt was referred back to PCP, who has since restarted BP meds - discussed importance of tight BP control.  4. Epiretinal membrane, left eye  - mild ERM - BCVA 20/40 -- stable - asymptomatic, no metamorphopsia - no indication for surgery at this time - continue to monitor  5. Pseudophakia OU  - s/p CE/IOL OU (S. [place])  - IOLs in good position, doing well  - continue to monitor  Ophthalmic Meds Ordered this visit:  Meds ordered this encounter  Medications   aflibercept (EYLEA) SOLN 2 mg     Return in about 5 weeks (around 03/31/2024) for f/u BRVO OS, DFE, OCT, Possible Injxn.  There are no Patient Instructions on file for this visit.  This document serves as a record of services personally performed by Karie Chimera, MD, PhD. It was created on their behalf by Laurey Morale, COT an ophthalmic technician. The creation of this record is the provider's dictation and/or activities during the visit.    Electronically signed by:  Charlette Caffey, COT  02/25/24 12:13 PM  This document serves as a record of services personally performed by Karie Chimera, MD, PhD. It was created on their behalf by Glee Arvin. Manson Passey, OA an ophthalmic technician. The creation of this record is the provider's dictation and/or activities during the visit.    Electronically signed by: Glee Arvin. Manson Passey, OA 02/25/24 12:13 PM  Karie Chimera, M.D., Ph.D. Diseases & Surgery of the Retina and Vitreous [facility]  I have reviewed the above documentation for accuracy and completeness, and I agree with the above. Karie Chimera, M.D., Ph.D. 02/25/24 12:14 PM   Abbreviations: M myopia (nearsighted); A astigmatism; H hyperopia (farsighted); P  presbyopia; Mrx spectacle prescription;  CTL contact lenses; OD right eye; OS left eye; OU both eyes  XT exotropia; ET esotropia; PEK punctate epithelial keratitis; PEE punctate epithelial erosions; DES dry eye syndrome; MGD meibomian gland dysfunction; ATs artificial tears; PFAT's preservative free artificial tears; NSC nuclear sclerotic cataract; PSC posterior subcapsular cataract; ERM epi-retinal membrane; PVD posterior vitreous detachment; RD retinal detachment; DM diabetes mellitus; DR diabetic retinopathy; NPDR non-proliferative diabetic retinopathy; PDR proliferative diabetic retinopathy; CSME clinically significant macular edema; DME diabetic macular edema; dbh dot blot hemorrhages; CWS cotton wool spot; POAG primary open angle glaucoma; C/D cup-to-disc ratio; HVF humphrey visual field; GVF goldmann visual field; OCT optical coherence tomography; IOP intraocular pressure; BRVO Branch retinal vein occlusion; CRVO central retinal vein occlusion; CRAO central retinal artery occlusion; BRAO branch retinal artery occlusion; RT retinal tear; SB scleral buckle; PPV pars plana vitrectomy; VH Vitreous hemorrhage; PRP panretinal laser photocoagulation; IVK intravitreal kenalog; VMT vitreomacular traction; MH Macular hole;  NVD neovascularization of the disc; NVE neovascularization elsewhere; AREDS age related eye disease study; ARMD age related macular degeneration; POAG primary open angle glaucoma; EBMD epithelial/anterior basement membrane dystrophy; ACIOL anterior chamber intraocular lens; IOL intraocular lens; PCIOL posterior chamber intraocular lens; Phaco/IOL phacoemulsification with intraocular lens placement; PRK photorefractive keratectomy; LASIK laser assisted in situ keratomileusis; HTN hypertension; DM diabetes mellitus; COPD chronic obstructive pulmonary disease

## 2024-02-25 ENCOUNTER — Ambulatory Visit (INDEPENDENT_AMBULATORY_CARE_PROVIDER_SITE_OTHER): Payer: 59 | Admitting: Ophthalmology

## 2024-02-25 ENCOUNTER — Encounter (INDEPENDENT_AMBULATORY_CARE_PROVIDER_SITE_OTHER): Payer: Self-pay | Admitting: Ophthalmology

## 2024-02-25 DIAGNOSIS — I10 Essential (primary) hypertension: Secondary | ICD-10-CM

## 2024-02-25 DIAGNOSIS — H35033 Hypertensive retinopathy, bilateral: Secondary | ICD-10-CM

## 2024-02-25 DIAGNOSIS — H34832 Tributary (branch) retinal vein occlusion, left eye, with macular edema: Secondary | ICD-10-CM | POA: Diagnosis not present

## 2024-02-25 DIAGNOSIS — H35372 Puckering of macula, left eye: Secondary | ICD-10-CM | POA: Diagnosis not present

## 2024-02-25 DIAGNOSIS — Z961 Presence of intraocular lens: Secondary | ICD-10-CM

## 2024-02-25 MED ORDER — AFLIBERCEPT 2MG/0.05ML IZ SOLN FOR KALEIDOSCOPE
2.0000 mg | INTRAVITREAL | Status: AC | PRN
  Administered 2024-02-25: 2 mg via INTRAVITREAL

## 2024-03-31 ENCOUNTER — Encounter (INDEPENDENT_AMBULATORY_CARE_PROVIDER_SITE_OTHER): Payer: 59 | Admitting: Ophthalmology

## 2024-03-31 DIAGNOSIS — H35033 Hypertensive retinopathy, bilateral: Secondary | ICD-10-CM

## 2024-03-31 DIAGNOSIS — H34832 Tributary (branch) retinal vein occlusion, left eye, with macular edema: Secondary | ICD-10-CM

## 2024-03-31 DIAGNOSIS — Z961 Presence of intraocular lens: Secondary | ICD-10-CM

## 2024-03-31 DIAGNOSIS — H35372 Puckering of macula, left eye: Secondary | ICD-10-CM

## 2024-03-31 DIAGNOSIS — I10 Essential (primary) hypertension: Secondary | ICD-10-CM

## 2024-03-31 NOTE — Progress Notes (Signed)
 [facility] - Clinic Note  04/01/2024    CHIEF COMPLAINT Patient presents for Retina Follow Up  HISTORY OF PRESENT ILLNESS: Roger Howe is a 68 y.o. male who presents to the clinic today for:   HPI     Retina Follow Up   Patient presents with  CRVO/BRVO.  In left eye.  Duration of 5 weeks.  Since onset it is stable.  I, the attending physician,  performed the HPI with the patient and updated documentation appropriately.        Comments   5  week retina follow up BRVO and IVE OS. Pt states vision has been about the same. Pt denies any flashes/floaters or discomfort.       Last edited by [name], MD on 04/01/2024 12:33 PM.    Pt states his vision is a little blurry  Referring physician: [name], MD [address] [place],  [place] [address]  HISTORICAL INFORMATION:  Selected notes from the MEDICAL RECORD NUMBER Referred by Dr. [name] for eval of BRVO OS   CURRENT MEDICATIONS: No current outpatient medications on file. (Ophthalmic Drugs)   No current facility-administered medications for this visit. (Ophthalmic Drugs)   Current Outpatient Medications (Other)  Medication Sig   aspirin 81 MG chewable tablet Chew 81 mg by mouth daily.   atorvastatin (LIPITOR) 20 MG tablet Take 20 mg by mouth daily at 6 PM.   carvedilol (COREG) 3.125 MG tablet TAKE 1 TABLET BY MOUTH TWICE A DAY *PLS CALL OFFICE FOR ADDITIONAL REFILLS*   fenofibrate micronized (LOFIBRA) 134 MG capsule Take 134 mg by mouth daily.   lisinopril-hydrochlorothiazide (ZESTORETIC) 10-12.5 MG tablet Take 1 tablet by mouth daily.   metoprolol succinate (TOPROL-XL) 25 MG 24 hr tablet Take 25 mg by mouth daily.   No current facility-administered medications for this visit. (Other)   REVIEW OF SYSTEMS: ROS   Positive for: Cardiovascular, Eyes Negative for: Constitutional, Gastrointestinal, Neurological, Skin, Genitourinary, Musculoskeletal, HENT, Endocrine, Respiratory,  Psychiatric, Allergic/Imm, Heme/Lymph Last edited by Elicia Lamp, COT on 04/01/2024  9:09 AM.     ALLERGIES No Known Allergies  PAST MEDICAL HISTORY Past Medical History:  Diagnosis Date   Abdominal aortic atherosclerosis (HCC)    Seen on catheterization   CAD, multiple vessel 05/18/2014   Coronary artery disease    Essential hypertension    Hypertensive retinopathy    NSTEMI (non-ST elevated myocardial infarction) (HCC) 05/17/2014   Post CABG Echo 09/19/2014: Normal EF 55-60% with mild Conc LVH. 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   LIMA to LAD, SVG to D1, Sequential SVG to PDA and RPL2, EVH via right thigh and leg   Past Surgical History:  Procedure Laterality Date   CARDIAC CATHETERIZATION  05/17/2014   DR HARDING: 95% proximal LAD, 90% ramus intermedius, 95% mid circumflex after OM1, neither percent RPDA   CORONARY ARTERY BYPASS GRAFT N/A 05/20/2014   Procedure: CORONARY ARTERY BYPASS GRAFTING (CABG) x 4,  LIMA-MID LAD,  SVG-DIAGONAL,  SEQUENTIAL SVG -PDA, PLB #2;  Surgeon: [name], MD;  Location: [facility] OR;  Service: Open Heart Surgery;  Laterality: N/A;   INTRAOPERATIVE TRANSESOPHAGEAL ECHOCARDIOGRAM N/A 05/20/2014   Procedure: INTRAOPERATIVE TRANSESOPHAGEAL ECHOCARDIOGRAM;  Surgeon: [name], MD;  Location: [facility] OR;  Service: Open Heart Surgery;  Laterality: N/A;   LEFT HEART CATHETERIZATION WITH CORONARY ANGIOGRAM N/A 05/18/2014   Procedure: LEFT HEART CATHETERIZATION WITH CORONARY ANGIOGRAM;  Surgeon: Onalee Hua  Starr Lake, MD;  Location: [facility] CATH LAB;  Service: Cardiovascular;  Laterality: N/A;   TRANSTHORACIC ECHOCARDIOGRAM  09/14/2014   Normal EF 55-60% with mild Conc LVH. No regional WMA, essentially normal   FAMILY HISTORY Family History  Problem Relation Age of Onset   Heart disease Brother        lives in [place]   Heart attack Neg Hx    Stroke Neg Hx    SOCIAL HISTORY Social History   Tobacco Use   Smoking status: Never   Smokeless  tobacco: Never  Vaping Use   Vaping status: Never Used  Substance Use Topics   Alcohol use: Never   Drug use: Never       OPHTHALMIC EXAM: Base Eye Exam     Visual Acuity (Snellen - Linear)       Right Left   Dist [place] 20/30 -1 20/50 -2   Dist ph [place] NI 20/30 -2         Tonometry (Tonopen, 9:05 AM)       Right Left   Pressure 13 13         Pupils       Dark Light Shape React APD   Right 3 2 Round Brisk None   Left 3 2 Round Slow None         Visual Fields       Left Right    Full Full         Extraocular Movement       Right Left    Full, Ortho Full, Ortho         Neuro/Psych     Oriented x3: Yes   Mood/Affect: Normal         Dilation     Both eyes: 1.0% Mydriacyl, 2.5% Phenylephrine @ 9:08 AM           Slit Lamp and Fundus Exam     Slit Lamp Exam       Right Left   Lids/Lashes Dermatochalasis - upper lid Dermatochalasis - upper lid   Conjunctiva/Sclera White and quiet Mild melanosis   Cornea Arcus, Well healed cataract wound Arcus, Well healed cataract wound   Anterior Chamber Deep and quiet Deep and quiet   Iris Round and dilated round and poorly dilated   Lens PCIOL in good position PCIOL in good position   Anterior Vitreous Vitreous syneresis, PVD, vitreous condensations Vitreous syneresis, Posterior vitreous detachment, vitreous condensations, Weiss ring         Fundus Exam       Right Left   Disc mild pallor, sharp rim, mild PPA Sharp rim, mild temporal Pallor   C/D Ratio 0.3 0.4   Macula Flat, Good foveal reflex, RPE mottling, no heme or edema Good foveal reflex, trace cystic changes, IRH/CWS superior and temporal macula -- stably improved, +ERM SN macula   Vessels attenuated, Tortuous attenuated, Tortuous, +sclerosis ST arcades   Periphery Attached, no heme Attached, IRH superior midzone (extension of ST BRVO) -- improved, punctate exudates nasal to disc           IMAGING AND PROCEDURES  Imaging and Procedures  for 04/01/2024  OCT, Retina - OU - Both Eyes       Right Eye Quality was good. Central Foveal Thickness: 266. Progression has been stable. Findings include normal foveal contour, no IRF, no SRF, retinal drusen .   Left Eye Quality was good. Central Foveal Thickness: 307. Progression has been stable. Findings include no  SRF, abnormal foveal contour, intraretinal hyper-reflective material, cystoid macular edema, epiretinal membrane, intraretinal fluid, inner retinal atrophy (Trace persistent cystic changes/IRF perifovea, stable IRA ST macula; persistent ERM with blunting of foveal contour).   Notes *Images captured and stored on drive  Diagnosis / Impression:  OD: NFP, no IRF/SRF OS: trace persistent cystic changes/IRF perifovea, stable IRA ST macula; persistent ERM with blunting of foveal contour  Clinical management:  See below  Abbreviations: NFP - Normal foveal profile. CME - cystoid macular edema. PED - pigment epithelial detachment. IRF - intraretinal fluid. SRF - subretinal fluid. EZ - ellipsoid zone. ERM - epiretinal membrane. ORA - outer retinal atrophy. ORT - outer retinal tubulation. SRHM - subretinal hyper-reflective material. IRHM - intraretinal hyper-reflective material      Intravitreal Injection, Pharmacologic Agent - OS - Left Eye       Time Out 04/01/2024. 10:03 AM. Confirmed correct patient, procedure, site, and patient consented.   Anesthesia Topical anesthesia was used. Anesthetic medications included Lidocaine 2%, Proparacaine 0.5%.   Procedure Preparation included 5% betadine to ocular surface, eyelid speculum. A (32g) needle was used.   Injection: 2 mg aflibercept 2 MG/0.05ML   Route: Intravitreal, Site: Left Eye   NDC: L6038910, Lot: [id number], Expiration date: 05/29/2025, Waste: 0 mL   Post-op Post injection exam found visual acuity of at least counting fingers. The patient tolerated the procedure well. There were no complications. The patient  received written and verbal post procedure care education. Post injection medications were not given.            ASSESSMENT/PLAN:    ICD-10-CM   1. Branch retinal vein occlusion of left eye with macular edema  H34.8320 OCT, Retina - OU - Both Eyes    Intravitreal Injection, Pharmacologic Agent - OS - Left Eye    aflibercept (EYLEA) SOLN 2 mg    2. Essential hypertension  I10     3. Hypertensive retinopathy of both eyes  H35.033     4. Epiretinal membrane (ERM) of left eye  H35.372     5. Pseudophakia of both eyes  Z96.1      1. BRVO w/ CME OS - s/p IVA OS #1 (9.21.22), #2 (10.19.22), #3 (11.16.22), #4 (12.28.22), #5 (02.22.23), #6 (06.07.23), #7 (07.26.23), #8 (09.13.23), #9 (10.18.23) -- IVA resistance - s/p IVE OS #1 (11.22.23), #2 (01.02.24), #3 (02.14.24), #4 (03.20.24), #5 (04.24.24), #5 (05.29.24), #6 (07.09.24), #7 (08.13.24), #9 (09.10.24), #10 (10.08.24), #11 (11.05.24),  #12 (12.03.24), #13 (12.31.24), #14 (01.28.25), #15 (02.25.25) - pt was first seen on 8.10.22, but could not be treated with intravitreal injection because BP was extremely high R arm (199/122) and L arm (186/110) - pt was referred back to PCP, who has since restarted BP meds - FA (05.29.24) shows OS: Severe vascular attenuation ST quad with capillary drop out and +telangiectasias, prominent dilated and tortuous venule off nasal disc, no NV - BCVA OS 20/40 (stable) - OCT shows OS: persistent cystic changes/IRF perifoveal, stable IRA ST macula; persistent ERM with blunting of foveal contour at 5 weeks - IRA suggestive of BRAO component( **history of increased IRF at 5 wks, noted on 08.13.24** **history of increase in IRF at 8 weeks, noted on 04.19.23 OCT** **history of increase in IRF at 7 weeks, noted on 09.13.23 OCT** - recommend IVE OS #16 today, 04.02.25 w/ f/u in 5 wks - pt wishes to proceed with injection of IVE - RBA of procedure discussed, questions answered - IVE informed consent obtained and  signed,  11.05.24 (OS) - see procedure note - f/u in 5 weeks, DFE/OCT, possible injection  2,3. Hypertensive retinopathy OU - BPs at presentation (8.10.22) were extremely high -- R arm (199/122) and L arm (186/110)  - pt was referred back to PCP, who has since restarted BP meds - discussed importance of tight BP control.  4. Epiretinal membrane, left eye  - mild ERM - BCVA 20/30 -- stable - asymptomatic, no metamorphopsia - no indication for surgery at this time - continue to monitor  5. Pseudophakia OU  - s/p CE/IOL OU (S. Groat)  - IOLs in good position, doing well  - continue to monitor  Ophthalmic Meds Ordered this visit:  Meds ordered this encounter  Medications   aflibercept (EYLEA) SOLN 2 mg     Return in about 5 weeks (around 05/06/2024) for f/u BRVO OS, DFE, OCT, Possible Injxn.  There are no Patient Instructions on file for this visit.  This document serves as a record of services personally performed by Karie Chimera, MD, PhD. It was created on their behalf by Glee Arvin. Manson Passey, OA an ophthalmic technician. The creation of this record is the provider's dictation and/or activities during the visit.    Electronically signed by: Glee Arvin. Manson Passey, OA 04/01/24 1:01 PM  Karie Chimera, M.D., Ph.D. Diseases & Surgery of the Retina and Vitreous [facility]  I have reviewed the above documentation for accuracy and completeness, and I agree with the above. Karie Chimera, M.D., Ph.D. 04/01/24 1:07 PM   Abbreviations: M myopia (nearsighted); A astigmatism; H hyperopia (farsighted); P presbyopia; Mrx spectacle prescription;  CTL contact lenses; OD right eye; OS left eye; OU both eyes  XT exotropia; ET esotropia; PEK punctate epithelial keratitis; PEE punctate epithelial erosions; DES dry eye syndrome; MGD meibomian gland dysfunction; ATs artificial tears; PFAT's preservative free artificial tears; NSC nuclear sclerotic cataract; PSC posterior subcapsular  cataract; ERM epi-retinal membrane; PVD posterior vitreous detachment; RD retinal detachment; DM diabetes mellitus; DR diabetic retinopathy; NPDR non-proliferative diabetic retinopathy; PDR proliferative diabetic retinopathy; CSME clinically significant macular edema; DME diabetic macular edema; dbh dot blot hemorrhages; CWS cotton wool spot; POAG primary open angle glaucoma; C/D cup-to-disc ratio; HVF humphrey visual field; GVF goldmann visual field; OCT optical coherence tomography; IOP intraocular pressure; BRVO Branch retinal vein occlusion; CRVO central retinal vein occlusion; CRAO central retinal artery occlusion; BRAO branch retinal artery occlusion; RT retinal tear; SB scleral buckle; PPV pars plana vitrectomy; VH Vitreous hemorrhage; PRP panretinal laser photocoagulation; IVK intravitreal kenalog; VMT vitreomacular traction; MH Macular hole;  NVD neovascularization of the disc; NVE neovascularization elsewhere; AREDS age related eye disease study; ARMD age related macular degeneration; POAG primary open angle glaucoma; EBMD epithelial/anterior basement membrane dystrophy; ACIOL anterior chamber intraocular lens; IOL intraocular lens; PCIOL posterior chamber intraocular lens; Phaco/IOL phacoemulsification with intraocular lens placement; PRK photorefractive keratectomy; LASIK laser assisted in situ keratomileusis; HTN hypertension; DM diabetes mellitus; COPD chronic obstructive pulmonary disease

## 2024-04-01 ENCOUNTER — Ambulatory Visit (INDEPENDENT_AMBULATORY_CARE_PROVIDER_SITE_OTHER): Admitting: Ophthalmology

## 2024-04-01 ENCOUNTER — Encounter (INDEPENDENT_AMBULATORY_CARE_PROVIDER_SITE_OTHER): Payer: Self-pay | Admitting: Ophthalmology

## 2024-04-01 DIAGNOSIS — Z961 Presence of intraocular lens: Secondary | ICD-10-CM

## 2024-04-01 DIAGNOSIS — H35372 Puckering of macula, left eye: Secondary | ICD-10-CM | POA: Diagnosis not present

## 2024-04-01 DIAGNOSIS — H34832 Tributary (branch) retinal vein occlusion, left eye, with macular edema: Secondary | ICD-10-CM

## 2024-04-01 DIAGNOSIS — I10 Essential (primary) hypertension: Secondary | ICD-10-CM

## 2024-04-01 DIAGNOSIS — H35033 Hypertensive retinopathy, bilateral: Secondary | ICD-10-CM

## 2024-04-01 MED ORDER — AFLIBERCEPT 2MG/0.05ML IZ SOLN FOR KALEIDOSCOPE
2.0000 mg | INTRAVITREAL | Status: AC | PRN
  Administered 2024-04-01: 2 mg via INTRAVITREAL

## 2024-04-23 NOTE — Progress Notes (Signed)
 [facility] - Clinic Note  05/06/2024    CHIEF COMPLAINT Patient presents for Retina Follow Up  HISTORY OF PRESENT ILLNESS: Roger Howe is a 68 y.o. male who presents to the clinic today for:   HPI     Retina Follow Up   Patient presents with  CRVO/BRVO.  In left eye.  Duration of 5 weeks.  Since onset it is stable.  I, the attending physician,  performed the HPI with the patient and updated documentation appropriately.        Comments   Patient states the vision is the same. He is using AT's.       Last edited by Ronelle Coffee, MD on 05/06/2024  6:00 PM.     Pt states his vision is the same.  Referring physician: Sidra Dredge, MD [address] [place],  [place] [address]  HISTORICAL INFORMATION:  Selected notes from the MEDICAL RECORD NUMBER Referred by Dr. Candi Chafe for eval of BRVO OS   CURRENT MEDICATIONS: No current outpatient medications on file. (Ophthalmic Drugs)   No current facility-administered medications for this visit. (Ophthalmic Drugs)   Current Outpatient Medications (Other)  Medication Sig   aspirin  81 MG chewable tablet Chew 81 mg by mouth daily.   atorvastatin  (LIPITOR) 20 MG tablet Take 20 mg by mouth daily at 6 PM.   carvedilol  (COREG ) 3.125 MG tablet TAKE 1 TABLET BY MOUTH TWICE A DAY *PLS CALL OFFICE FOR ADDITIONAL REFILLS*   fenofibrate micronized (LOFIBRA) 134 MG capsule Take 134 mg by mouth daily.   lisinopril -hydrochlorothiazide  (ZESTORETIC ) 10-12.5 MG tablet Take 1 tablet by mouth daily.   metoprolol  succinate (TOPROL -XL) 25 MG 24 hr tablet Take 25 mg by mouth daily.   No current facility-administered medications for this visit. (Other)   REVIEW OF SYSTEMS: ROS   Positive for: Cardiovascular, Eyes Negative for: Constitutional, Gastrointestinal, Neurological, Skin, Genitourinary, Musculoskeletal, HENT, Endocrine, Respiratory, Psychiatric, Allergic/Imm, Heme/Lymph Last edited by Olene Berne, COT on  05/06/2024  8:59 AM.     ALLERGIES No Known Allergies  PAST MEDICAL HISTORY Past Medical History:  Diagnosis Date   Abdominal aortic atherosclerosis (HCC)    Seen on catheterization   CAD, multiple vessel 05/18/2014   Coronary artery disease    Essential hypertension    Hypertensive retinopathy    NSTEMI (non-ST elevated myocardial infarction) (HCC) 05/17/2014   Post CABG Echo 09/19/2014: Normal EF 55-60% with mild Conc LVH. 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   LIMA to LAD, SVG to D1, Sequential SVG to PDA and RPL2, EVH via right thigh and leg   Past Surgical History:  Procedure Laterality Date   CARDIAC CATHETERIZATION  05/17/2014   DR HARDING: 95% proximal LAD, 90% ramus intermedius, 95% mid circumflex after OM1, neither percent RPDA   CORONARY ARTERY BYPASS GRAFT N/A 05/20/2014   Procedure: CORONARY ARTERY BYPASS GRAFTING (CABG) x 4,  LIMA-MID LAD,  SVG-DIAGONAL,  SEQUENTIAL SVG -PDA, PLB #2;  Surgeon: Gardenia Jump, MD;  Location: [facility] OR;  Service: Open Heart Surgery;  Laterality: N/A;   INTRAOPERATIVE TRANSESOPHAGEAL ECHOCARDIOGRAM N/A 05/20/2014   Procedure: INTRAOPERATIVE TRANSESOPHAGEAL ECHOCARDIOGRAM;  Surgeon: Gardenia Jump, MD;  Location[facility] OR;  Service: Open Heart Surgery;  Laterality: N/A;   LEFT HEART CATHETERIZATION WITH CORONARY ANGIOGRAM N/A 05/18/2014   Procedure: LEFT HEART CATHETERIZATION WITH CORONARY ANGIOGRAM;  Surgeon: Arleen Lacer, MD;  Location: [facility] CATH LAB;  Service: Cardiovascular;  Laterality: N/A;   TRANSTHORACIC ECHOCARDIOGRAM  09/14/2014   Normal EF 55-60% with mild Conc LVH. No regional WMA, essentially normal   FAMILY HISTORY Family History  Problem Relation Age of Onset   Heart disease Brother        lives in [place]    Heart attack Neg Hx    Stroke Neg Hx    SOCIAL HISTORY Social History   Tobacco Use   Smoking status: Never   Smokeless tobacco: Never  Vaping Use   Vaping status: Never Used  Substance Use  Topics   Alcohol use: Never   Drug use: Never       OPHTHALMIC EXAM: Base Eye Exam     Visual Acuity (Snellen - Linear)       Right Left   Dist [place] 20/30 20/40   Dist ph [place] NI NI         Tonometry (Tonopen, 9:05 AM)       Right Left   Pressure 15 19         Pupils       Dark Light Shape React APD   Right 3 2 Round Brisk None   Left 3 2 Round Brisk None         Visual Fields       Left Right    Full Full         Extraocular Movement       Right Left    Full, Ortho Full, Ortho         Neuro/Psych     Oriented x3: Yes   Mood/Affect: Normal         Dilation     Both eyes: 1.0% Mydriacyl, 2.5% Phenylephrine  @ 9:00 AM           Slit Lamp and Fundus Exam     Slit Lamp Exam       Right Left   Lids/Lashes Dermatochalasis - upper lid Dermatochalasis - upper lid   Conjunctiva/Sclera White and quiet Mild melanosis   Cornea Arcus, Well healed cataract wound Arcus, Well healed cataract wound   Anterior Chamber Deep and quiet Deep and quiet   Iris Round and dilated round and poorly dilated   Lens PCIOL in good position PCIOL in good position   Anterior Vitreous Vitreous syneresis, PVD, vitreous condensations Vitreous syneresis, Posterior vitreous detachment, vitreous condensations, Weiss ring         Fundus Exam       Right Left   Disc mild pallor, sharp rim, mild PPA Sharp rim, mild temporal Pallor   C/D Ratio 0.3 0.4   Macula Flat, Good foveal reflex, RPE mottling, no heme or edema Good foveal reflex, trace cystic changes, IRH/CWS superior and temporal macula -- stably improved, +ERM SN macula   Vessels attenuated, Tortuous attenuated, Tortuous, +sclerosis ST arcades   Periphery Attached, no heme Attached, IRH superior midzone (extension of ST BRVO) -- improved, punctate exudates nasal to disc           IMAGING AND PROCEDURES  Imaging and Procedures for 05/06/2024  OCT, Retina - OU - Both Eyes       Right Eye Quality was good.  Central Foveal Thickness: 270. Progression has been stable. Findings include normal foveal contour, no IRF, no SRF, retinal drusen .   Left Eye Quality was good. Central Foveal Thickness: 307. Progression has been stable. Findings include no SRF, abnormal foveal contour, intraretinal hyper-reflective material, cystoid macular edema, epiretinal membrane, intraretinal fluid, inner  retinal atrophy (Trace persistent cystic changes/IRF perifovea, stable IRA ST macula; persistent ERM with blunting of foveal contour).   Notes *Images captured and stored on drive  Diagnosis / Impression:  OD: NFP, no IRF/SRF OS: trace persistent cystic changes/IRF perifovea, stable IRA ST macula; persistent ERM with blunting of foveal contour  Clinical management:  See below  Abbreviations: NFP - Normal foveal profile. CME - cystoid macular edema. PED - pigment epithelial detachment. IRF - intraretinal fluid. SRF - subretinal fluid. EZ - ellipsoid zone. ERM - epiretinal membrane. ORA - outer retinal atrophy. ORT - outer retinal tubulation. SRHM - subretinal hyper-reflective material. IRHM - intraretinal hyper-reflective material      Intravitreal Injection, Pharmacologic Agent - OS - Left Eye       Time Out 05/06/2024. 10:07 AM. Confirmed correct patient, procedure, site, and patient consented.   Anesthesia Topical anesthesia was used. Anesthetic medications included Lidocaine  2%, Proparacaine 0.5%.   Procedure Preparation included 5% betadine to ocular surface, eyelid speculum. A (32g) needle was used.   Injection: 2 mg aflibercept  2 MG/0.05ML   Route: Intravitreal, Site: Left Eye   NDC: D2246706, Lot: [id number], Expiration date: 04/29/2025, Waste: 0 mL   Post-op Post injection exam found visual acuity of at least counting fingers. The patient tolerated the procedure well. There were no complications. The patient received written and verbal post procedure care education. Post injection medications  were not given.             ASSESSMENT/PLAN:    ICD-10-CM   1. Branch retinal vein occlusion of left eye with macular edema  H34.8320 OCT, Retina - OU - Both Eyes    Intravitreal Injection, Pharmacologic Agent - OS - Left Eye    aflibercept  (EYLEA ) SOLN 2 mg    2. Essential hypertension  I10     3. Hypertensive retinopathy of both eyes  H35.033     4. Epiretinal membrane (ERM) of left eye  H35.372     5. Pseudophakia of both eyes  Z96.1      1. BRVO w/ CME OS - s/p IVA OS #1 (9.21.22), #2 (10.19.22), #3 (11.16.22), #4 (12.28.22), #5 (02.22.23), #6 (06.07.23), #7 (07.26.23), #8 (09.13.23), #9 (10.18.23) -- IVA resistance =================== - s/p IVE OS #1 (11.22.23), #2 (01.02.24), #3 (02.14.24), #4 (03.20.24), #5 (04.24.24), #5 (05.29.24), #6 (07.09.24), #7 (08.13.24), #9 (09.10.24), #10 (10.08.24), #11 (11.05.24),  #12 (12.03.24), #13 (12.31.24), #14 (01.28.25), #15 (02.25.25), #16 (04.02.25) - pt was first seen on 8.10.22, but could not be treated with intravitreal injection because BP was extremely high R arm (199/122) and L arm (186/110) - pt was referred back to PCP, who has since restarted BP meds - FA (05.29.24) shows OS: Severe vascular attenuation ST quad with capillary drop out and +telangiectasias, prominent dilated and tortuous venule off nasal disc, no NV - BCVA OS 20/40 (stable) - OCT shows OS: persistent cystic changes/IRF perifoveal, stable IRA ST macula; persistent ERM with blunting of foveal contour at 5 weeks - IRA suggestive of BRAO component( **history of increased IRF at 5 wks, noted on 08.13.24** **history of increase in IRF at 8 weeks, noted on 04.19.23 OCT** **history of increase in IRF at 7 weeks, noted on 09.13.23 OCT** - recommend IVE OS #17 today, 05.07.25 w/ f/u in 5 wks - pt wishes to proceed with injection of IVE - RBA of procedure discussed, questions answered - **discussed decreased efficacy / resistance to Eylea  and potential benefit of  switching medication**   - will  work on getting the [employer] through insurance - IVE informed consent obtained and signed, 11.05.24 (OS) - see procedure note - f/u in 5 weeks, DFE/OCT, possible injection  2,3. Hypertensive retinopathy OU - BPs at presentation (8.10.22) were extremely high -- R arm (199/122) and L arm (186/110)  - pt was referred back to PCP, who has since restarted BP meds - discussed importance of tight BP control.  4. Epiretinal membrane, left eye  - mild ERM - BCVA 20/30 -- stable - asymptomatic, no metamorphopsia - no indication for surgery at this time - continue to monitor  5. Pseudophakia OU  - s/p CE/IOL OU (S. [place])  - IOLs in good position, doing well  - continue to monitor  Ophthalmic Meds Ordered this visit:  Meds ordered this encounter  Medications   aflibercept  (EYLEA ) SOLN 2 mg     Return in about 5 weeks (around 06/10/2024) for f/u BRVO OS , DFE, OCT, Possible, IVE, Vs., IVV, OS.  There are no Patient Instructions on file for this visit.  This document serves as a record of services personally performed by Jeanice Millard, MD, PhD. It was created on their behalf by Angelia Kelp, an ophthalmic technician. The creation of this record is the provider's dictation and/or activities during the visit.    Electronically signed by: Angelia Kelp, OA, 05/06/24  6:01 PM  This document serves as a record of services personally performed by Jeanice Millard, MD, PhD. It was created on their behalf by Olene Berne, COT an ophthalmic technician. The creation of this record is the provider's dictation and/or activities during the visit.    Electronically signed by:  Olene Berne, COT  05/06/24 6:01 PM  Jeanice Millard, M.D., Ph.D. Diseases & Surgery of the Retina and Vitreous [facility]  I have reviewed the above documentation for accuracy and completeness, and I agree with the above. Jeanice Millard,  M.D., Ph.D. 05/06/24 6:02 PM   Abbreviations: M myopia (nearsighted); A astigmatism; H hyperopia (farsighted); P presbyopia; Mrx spectacle prescription;  CTL contact lenses; OD right eye; OS left eye; OU both eyes  XT exotropia; ET esotropia; PEK punctate epithelial keratitis; PEE punctate epithelial erosions; DES dry eye syndrome; MGD meibomian gland dysfunction; ATs artificial tears; PFAT's preservative free artificial tears; NSC nuclear sclerotic cataract; PSC posterior subcapsular cataract; ERM epi-retinal membrane; PVD posterior vitreous detachment; RD retinal detachment; DM diabetes mellitus; DR diabetic retinopathy; NPDR non-proliferative diabetic retinopathy; PDR proliferative diabetic retinopathy; CSME clinically significant macular edema; DME diabetic macular edema; dbh dot blot hemorrhages; CWS cotton wool spot; POAG primary open angle glaucoma; C/D cup-to-disc ratio; HVF humphrey visual field; GVF goldmann visual field; OCT optical coherence tomography; IOP intraocular pressure; BRVO Branch retinal vein occlusion; CRVO central retinal vein occlusion; CRAO central retinal artery occlusion; BRAO branch retinal artery occlusion; RT retinal tear; SB scleral buckle; PPV pars plana vitrectomy; VH Vitreous hemorrhage; PRP panretinal laser photocoagulation; IVK intravitreal kenalog; VMT vitreomacular traction; MH Macular hole;  NVD neovascularization of the disc; NVE neovascularization elsewhere; AREDS age related eye disease study; ARMD age related macular degeneration; POAG primary open angle glaucoma; EBMD epithelial/anterior basement membrane dystrophy; ACIOL anterior chamber intraocular lens; IOL intraocular lens; PCIOL posterior chamber intraocular lens; Phaco/IOL phacoemulsification with intraocular lens placement; PRK photorefractive keratectomy; LASIK laser assisted in situ keratomileusis; HTN hypertension; DM diabetes mellitus; COPD chronic obstructive pulmonary disease

## 2024-05-06 ENCOUNTER — Encounter (INDEPENDENT_AMBULATORY_CARE_PROVIDER_SITE_OTHER): Payer: Self-pay | Admitting: Ophthalmology

## 2024-05-06 ENCOUNTER — Ambulatory Visit (INDEPENDENT_AMBULATORY_CARE_PROVIDER_SITE_OTHER): Admitting: Ophthalmology

## 2024-05-06 DIAGNOSIS — H35372 Puckering of macula, left eye: Secondary | ICD-10-CM

## 2024-05-06 DIAGNOSIS — I10 Essential (primary) hypertension: Secondary | ICD-10-CM | POA: Diagnosis not present

## 2024-05-06 DIAGNOSIS — Z961 Presence of intraocular lens: Secondary | ICD-10-CM

## 2024-05-06 DIAGNOSIS — H35033 Hypertensive retinopathy, bilateral: Secondary | ICD-10-CM

## 2024-05-06 DIAGNOSIS — H34832 Tributary (branch) retinal vein occlusion, left eye, with macular edema: Secondary | ICD-10-CM

## 2024-05-06 MED ORDER — AFLIBERCEPT 2MG/0.05ML IZ SOLN FOR KALEIDOSCOPE
2.0000 mg | INTRAVITREAL | Status: AC | PRN
  Administered 2024-05-06: 2 mg via INTRAVITREAL

## 2024-06-09 NOTE — Progress Notes (Signed)
 [facility] - Clinic Note  06/15/2024    CHIEF COMPLAINT Patient presents for Retina Follow Up  HISTORY OF PRESENT ILLNESS: Roger Howe is a 68 y.o. male who presents to the clinic today for:   HPI     Retina Follow Up   Patient presents with  CRVO/BRVO.  In left eye.  This started 3 years ago.  Duration of 5 weeks.  Since onset it is stable.  I, the attending physician,  performed the HPI with the patient and updated documentation appropriately.        Comments   Pt states no changes in vision. Pt denies FOL/floaters/pain. Pt is using Systane tid OU.      Last edited by Ronelle Coffee, MD on 06/15/2024 11:37 PM.    Pt states his vision is the same.  Referring physician: Sidra Dredge, MD [address] [place],  [place] [address]  HISTORICAL INFORMATION:  Selected notes from the MEDICAL RECORD NUMBER Referred by Dr. Candi Chafe for eval of BRVO OS   CURRENT MEDICATIONS: No current outpatient medications on file. (Ophthalmic Drugs)   No current facility-administered medications for this visit. (Ophthalmic Drugs)   Current Outpatient Medications (Other)  Medication Sig   aspirin  81 MG chewable tablet Chew 81 mg by mouth daily.   atorvastatin  (LIPITOR) 20 MG tablet Take 20 mg by mouth daily at 6 PM.   carvedilol  (COREG ) 3.125 MG tablet TAKE 1 TABLET BY MOUTH TWICE A DAY *PLS CALL OFFICE FOR ADDITIONAL REFILLS*   fenofibrate micronized (LOFIBRA) 134 MG capsule Take 134 mg by mouth daily.   lisinopril -hydrochlorothiazide  (ZESTORETIC ) 10-12.5 MG tablet Take 1 tablet by mouth daily.   metoprolol  succinate (TOPROL -XL) 25 MG 24 hr tablet Take 25 mg by mouth daily.   No current facility-administered medications for this visit. (Other)   REVIEW OF SYSTEMS: ROS   Positive for: Cardiovascular, Eyes Negative for: Constitutional, Gastrointestinal, Neurological, Skin, Genitourinary, Musculoskeletal, HENT, Endocrine, Respiratory, Psychiatric, Allergic/Imm,  Heme/Lymph Last edited by Carrington Clack, COT on 06/15/2024  9:02 AM.      ALLERGIES No Known Allergies  PAST MEDICAL HISTORY Past Medical History:  Diagnosis Date   Abdominal aortic atherosclerosis (HCC)    Seen on catheterization   CAD, multiple vessel 05/18/2014   Coronary artery disease    Essential hypertension    Hypertensive retinopathy    NSTEMI (non-ST elevated myocardial infarction) (HCC) 05/17/2014   Post CABG Echo 09/19/2014: Normal EF 55-60% with mild Conc LVH. 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   LIMA to LAD, SVG to D1, Sequential SVG to PDA and RPL2, EVH via right thigh and leg   Past Surgical History:  Procedure Laterality Date   CARDIAC CATHETERIZATION  05/17/2014   DR HARDING: 95% proximal LAD, 90% ramus intermedius, 95% mid circumflex after OM1, neither percent RPDA   CORONARY ARTERY BYPASS GRAFT N/A 05/20/2014   Procedure: CORONARY ARTERY BYPASS GRAFTING (CABG) x 4,  LIMA-MID LAD,  SVG-DIAGONAL,  SEQUENTIAL SVG -PDA, PLB #2;  Surgeon: Gardenia Jump, MD;  Location: [facility] OR;  Service: Open Heart Surgery;  Laterality: N/A;   INTRAOPERATIVE TRANSESOPHAGEAL ECHOCARDIOGRAM N/A 05/20/2014   Procedure: INTRAOPERATIVE TRANSESOPHAGEAL ECHOCARDIOGRAM;  Surgeon: Gardenia Jump, MD;  Location: [facility] OR;  Service: Open Heart Surgery;  Laterality: N/A;   LEFT HEART CATHETERIZATION WITH CORONARY ANGIOGRAM N/A 05/18/2014   Procedure: LEFT HEART CATHETERIZATION WITH CORONARY ANGIOGRAM;  Surgeon: Arleen Lacer, MD;  Location: [facility] CATH LAB;  Service: Cardiovascular;  Laterality: N/A;   TRANSTHORACIC ECHOCARDIOGRAM  09/14/2014   Normal EF 55-60% with mild Conc LVH. No regional WMA, essentially normal   FAMILY HISTORY Family History  Problem Relation Age of Onset   Heart disease Brother        lives in [place]    Heart attack Neg Hx    Stroke Neg Hx    SOCIAL HISTORY Social History   Tobacco Use   Smoking status: Never   Smokeless tobacco: Never  Vaping  Use   Vaping status: Never Used  Substance Use Topics   Alcohol use: Never   Drug use: Never       OPHTHALMIC EXAM: Base Eye Exam     Visual Acuity (Snellen - Linear)       Right Left   Dist [place] 20/30 -1 20/40 +1   Dist ph [place] NI 20/30 -2         Tonometry (Tonopen, 9:12 AM)       Right Left   Pressure 14 14         Pupils       Pupils Dark Light Shape React APD   Right PERRL 3 2 Round Brisk None   Left PERRL 3 2 Round Brisk None         Visual Fields       Left Right    Full Full         Extraocular Movement       Right Left    Full, Ortho Full, Ortho         Neuro/Psych     Oriented x3: Yes   Mood/Affect: Normal         Dilation     Both eyes: 1.0% Mydriacyl, 2.5% Phenylephrine  @ 9:12 AM           Slit Lamp and Fundus Exam     Slit Lamp Exam       Right Left   Lids/Lashes Dermatochalasis - upper lid Dermatochalasis - upper lid   Conjunctiva/Sclera White and quiet Mild melanosis   Cornea Arcus, Well healed cataract wound Arcus, Well healed cataract wound   Anterior Chamber Deep and quiet Deep and quiet   Iris Round and dilated round and poorly dilated   Lens PCIOL in good position PCIOL in good position   Anterior Vitreous Vitreous syneresis, PVD, vitreous condensations Vitreous syneresis, Posterior vitreous detachment, vitreous condensations, Weiss ring         Fundus Exam       Right Left   Disc mild pallor, sharp rim, mild PPA Sharp rim, mild temporal Pallor   C/D Ratio 0.3 0.4   Macula Flat, Good foveal reflex, RPE mottling, no heme or edema Good foveal reflex, trace cystic changes, IRH/CWS superior and temporal macula -- stably improved, +ERM SN macula   Vessels attenuated, Tortuous attenuated, Tortuous, +sclerosis ST arcades   Periphery Attached, no heme Attached, IRH superior midzone (extension of ST BRVO) -- improved, punctate exudates nasal to disc           IMAGING AND PROCEDURES  Imaging and Procedures for  06/15/2024  OCT, Retina - OU - Both Eyes       Right Eye Quality was good. Central Foveal Thickness: 266. Progression has been stable. Findings include normal foveal contour, no IRF, no SRF, retinal drusen .   Left Eye Quality was good. Central Foveal Thickness: 338. Progression has been stable. Findings include no SRF,  abnormal foveal contour, intraretinal hyper-reflective material, cystoid macular edema, epiretinal membrane, intraretinal fluid, inner retinal atrophy (Trace persistent cystic changes/IRF perifovea, stable IRA ST macula; persistent ERM with blunting of foveal contour).   Notes *Images captured and stored on drive  Diagnosis / Impression:  OD: NFP, no IRF/SRF OS: trace persistent cystic changes/IRF perifovea, stable IRA ST macula; persistent ERM with blunting of foveal contour  Clinical management:  See below  Abbreviations: NFP - Normal foveal profile. CME - cystoid macular edema. PED - pigment epithelial detachment. IRF - intraretinal fluid. SRF - subretinal fluid. EZ - ellipsoid zone. ERM - epiretinal membrane. ORA - outer retinal atrophy. ORT - outer retinal tubulation. SRHM - subretinal hyper-reflective material. IRHM - intraretinal hyper-reflective material      Intravitreal Injection, Pharmacologic Agent - OS - Left Eye       Time Out 06/15/2024. 10:09 AM. Confirmed correct patient, procedure, site, and patient consented.   Anesthesia Topical anesthesia was used. Anesthetic medications included Lidocaine  2%, Proparacaine 0.5%.   Procedure Preparation included 5% betadine to ocular surface, eyelid speculum. A supplied (32g) needle was used.   Injection: 6 mg faricimab-svoa 6 MG/0.05ML   Route: Intravitreal, Site: Left Eye   NDC: 40981-191-47, Lot: [id number], Expiration date: 04/29/2025, Waste: 0 mL   Post-op Post injection exam found visual acuity of at least counting fingers. The patient tolerated the procedure well. There were no complications. The  patient received written and verbal post procedure care education. Post injection medications were not given.            ASSESSMENT/PLAN:    ICD-10-CM   1. Branch retinal vein occlusion of left eye with macular edema  H34.8320 OCT, Retina - OU - Both Eyes    Intravitreal Injection, Pharmacologic Agent - OS - Left Eye    faricimab-svoa (VABYSMO) 6mg /0.51mL intravitreal injection    2. Essential hypertension  I10     3. Hypertensive retinopathy of both eyes  H35.033     4. Epiretinal membrane (ERM) of left eye  H35.372     5. Pseudophakia of both eyes  Z96.1      1. BRVO w/ CME OS - s/p IVA OS #1 (9.21.22), #2 (10.19.22), #3 (11.16.22), #4 (12.28.22), #5 (02.22.23), #6 (06.07.23), #7 (07.26.23), #8 (09.13.23), #9 (10.18.23) -- IVA resistance =================== - s/p IVE OS #1 (11.22.23), #2 (01.02.24), #3 (02.14.24), #4 (03.20.24), #5 (04.24.24), #5 (05.29.24), #6 (07.09.24), #7 (08.13.24), #9 (09.10.24), #10 (10.08.24), #11 (11.05.24),  #12 (12.03.24), #13 (12.31.24), #14 (01.28.25), #15 (02.25.25), #16 (04.02.25), #17 (05.07.25) - pt was first seen on 8.10.22, but could not be treated with intravitreal injection because BP was extremely high R arm (199/122) and L arm (186/110) - pt was referred back to PCP, who has since restarted BP meds - FA (05.29.24) shows OS: Severe vascular attenuation ST quad with capillary drop out and +telangiectasias, prominent dilated and tortuous venule off nasal disc, no NV - BCVA OS 20/40 (stable) - OCT shows OS: persistent cystic changes/IRF perifoveal, stable IRA ST macula; persistent ERM with blunting of foveal contour at 5 weeks - IRA suggestive of BRAO component( **history of increased IRF at 5 wks, noted on 08.13.24** **history of increase in IRF at 8 weeks, noted on 04.19.23 OCT** **history of increase in IRF at 7 weeks, noted on 09.13.23 OCT** **discussed decreased efficacy / resistance to Eylea  and potential benefit of switching  medication**  - recommend switching to IVV OS #1 today, 06.16.25 w/ f/u in 5 wks - pt  wishes to proceed with injection of IVV - RBA of procedure discussed, questions answered  - IVV informed consent obtained and signed, 06.16.25 (OS) - see procedure note - Eylea  and Vabysmo approved for 2025 - f/u in 5 weeks, DFE/OCT, possible injection  2,3. Hypertensive retinopathy OU - BPs at presentation (8.10.22) were extremely high -- R arm (199/122) and L arm (186/110)  - pt was referred back to PCP, who has since restarted BP meds - discussed importance of tight BP control  4. Epiretinal membrane, left eye  - mild ERM - BCVA 20/30 -- stable - asymptomatic, no metamorphopsia - no indication for surgery at this time - monitor  5. Pseudophakia OU  - s/p CE/IOL OU (S. Groat)  - IOLs in good position, doing well - monitor  Ophthalmic Meds Ordered this visit:  Meds ordered this encounter  Medications   faricimab-svoa (VABYSMO) 6mg /0.36mL intravitreal injection     Return in about 5 weeks (around 07/20/2024) for f/u BRVO OS, DFE, OCT, Possible Injxn.  There are no Patient Instructions on file for this visit.  This document serves as a record of services personally performed by Jeanice Millard, MD, PhD. It was created on their behalf by Mayola Specking, COA an ophthalmic technician. The creation of this record is the provider's dictation and/or activities during the visit.   Electronically signed by: Carrington Clack, COT  06/19/24  11:45 PM   This document serves as a record of services personally performed by Jeanice Millard, MD, PhD. It was created on their behalf by Morley Arabia. Bevin Bucks, OA an ophthalmic technician. The creation of this record is the provider's dictation and/or activities during the visit.    Electronically signed by: Morley Arabia. Bevin Bucks, OA 06/19/24 11:45 PM  Jeanice Millard, M.D., Ph.D. Diseases & Surgery of the Retina and Vitreous [facility]  I have  reviewed the above documentation for accuracy and completeness, and I agree with the above. Jeanice Millard, M.D., Ph.D. 06/19/24 11:47 PM   Abbreviations: M myopia (nearsighted); A astigmatism; H hyperopia (farsighted); P presbyopia; Mrx spectacle prescription;  CTL contact lenses; OD right eye; OS left eye; OU both eyes  XT exotropia; ET esotropia; PEK punctate epithelial keratitis; PEE punctate epithelial erosions; DES dry eye syndrome; MGD meibomian gland dysfunction; ATs artificial tears; PFAT's preservative free artificial tears; NSC nuclear sclerotic cataract; PSC posterior subcapsular cataract; ERM epi-retinal membrane; PVD posterior vitreous detachment; RD retinal detachment; DM diabetes mellitus; DR diabetic retinopathy; NPDR non-proliferative diabetic retinopathy; PDR proliferative diabetic retinopathy; CSME clinically significant macular edema; DME diabetic macular edema; dbh dot blot hemorrhages; CWS cotton wool spot; POAG primary open angle glaucoma; C/D cup-to-disc ratio; HVF humphrey visual field; GVF goldmann visual field; OCT optical coherence tomography; IOP intraocular pressure; BRVO Branch retinal vein occlusion; CRVO central retinal vein occlusion; CRAO central retinal artery occlusion; BRAO branch retinal artery occlusion; RT retinal tear; SB scleral buckle; PPV pars plana vitrectomy; VH Vitreous hemorrhage; PRP panretinal laser photocoagulation; IVK intravitreal kenalog; VMT vitreomacular traction; MH Macular hole;  NVD neovascularization of the disc; NVE neovascularization elsewhere; AREDS age related eye disease study; ARMD age related macular degeneration; POAG primary open angle glaucoma; EBMD epithelial/anterior basement membrane dystrophy; ACIOL anterior chamber intraocular lens; IOL intraocular lens; PCIOL posterior chamber intraocular lens; Phaco/IOL phacoemulsification with intraocular lens placement; PRK photorefractive keratectomy; LASIK laser assisted in situ keratomileusis;  HTN hypertension; DM diabetes mellitus; COPD chronic obstructive pulmonary disease

## 2024-06-15 ENCOUNTER — Encounter (INDEPENDENT_AMBULATORY_CARE_PROVIDER_SITE_OTHER): Payer: Self-pay | Admitting: Ophthalmology

## 2024-06-15 ENCOUNTER — Ambulatory Visit (INDEPENDENT_AMBULATORY_CARE_PROVIDER_SITE_OTHER): Admitting: Ophthalmology

## 2024-06-15 DIAGNOSIS — I10 Essential (primary) hypertension: Secondary | ICD-10-CM | POA: Diagnosis not present

## 2024-06-15 DIAGNOSIS — H35033 Hypertensive retinopathy, bilateral: Secondary | ICD-10-CM

## 2024-06-15 DIAGNOSIS — H34832 Tributary (branch) retinal vein occlusion, left eye, with macular edema: Secondary | ICD-10-CM | POA: Diagnosis not present

## 2024-06-15 DIAGNOSIS — H35372 Puckering of macula, left eye: Secondary | ICD-10-CM | POA: Diagnosis not present

## 2024-06-15 DIAGNOSIS — Z961 Presence of intraocular lens: Secondary | ICD-10-CM

## 2024-06-15 MED ORDER — FARICIMAB-SVOA 6 MG/0.05ML IZ SOSY: 6.0000 mg | PREFILLED_SYRINGE | INTRAVITREAL | Status: AC | PRN

## 2024-07-08 NOTE — Progress Notes (Signed)
 [facility] - Clinic Note  07/20/2024    CHIEF COMPLAINT Patient presents for Retina Follow Up  HISTORY OF PRESENT ILLNESS: Roger Howe is a 68 y.o. male who presents to the clinic today for:   HPI     Retina Follow Up   Patient presents with  CRVO/BRVO.  In left eye.  This started 5 weeks ago.        Comments   Patient here for 5 weeks retina follow up for BRVO OS. Patient states vision is much better. No eye pain.       Last edited by Orval Asberry RAMAN, COA on 07/20/2024  9:19 AM.     Pt states VA does seem improved OS.   Referring physician: Octavia Charlie Hamilton, MD [address] [place],  [place] [address]  HISTORICAL INFORMATION:  Selected notes from the MEDICAL RECORD NUMBER Referred by Dr. Octavia for eval of BRVO OS   CURRENT MEDICATIONS: No current outpatient medications on file. (Ophthalmic Drugs)   No current facility-administered medications for this visit. (Ophthalmic Drugs)   Current Outpatient Medications (Other)  Medication Sig   aspirin  81 MG chewable tablet Chew 81 mg by mouth daily.   atorvastatin  (LIPITOR) 20 MG tablet Take 20 mg by mouth daily at 6 PM.   carvedilol  (COREG ) 3.125 MG tablet TAKE 1 TABLET BY MOUTH TWICE A DAY *PLS CALL OFFICE FOR ADDITIONAL REFILLS*   fenofibrate micronized (LOFIBRA) 134 MG capsule Take 134 mg by mouth daily.   lisinopril -hydrochlorothiazide  (ZESTORETIC ) 10-12.5 MG tablet Take 1 tablet by mouth daily.   metoprolol  succinate (TOPROL -XL) 25 MG 24 hr tablet Take 25 mg by mouth daily.   No current facility-administered medications for this visit. (Other)   REVIEW OF SYSTEMS: ROS   Positive for: Cardiovascular, Eyes Negative for: Constitutional, Gastrointestinal, Neurological, Skin, Genitourinary, Musculoskeletal, HENT, Endocrine, Respiratory, Psychiatric, Allergic/Imm, Heme/Lymph Last edited by Orval Asberry RAMAN, COA on 07/20/2024  9:19 AM.       ALLERGIES No Known Allergies  PAST MEDICAL  HISTORY Past Medical History:  Diagnosis Date   Abdominal aortic atherosclerosis (HCC)    Seen on catheterization   CAD, multiple vessel 05/18/2014   Coronary artery disease    Essential hypertension    Hypertensive retinopathy    NSTEMI (non-ST elevated myocardial infarction) (HCC) 05/17/2014   Post CABG Echo 09/19/2014: Normal EF 55-60% with mild Conc LVH. 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   LIMA to LAD, SVG to D1, Sequential SVG to PDA and RPL2, EVH via right thigh and leg   Past Surgical History:  Procedure Laterality Date   CARDIAC CATHETERIZATION  05/17/2014   DR HARDING: 95% proximal LAD, 90% ramus intermedius, 95% mid circumflex after OM1, neither percent RPDA   CORONARY ARTERY BYPASS GRAFT N/A 05/20/2014   Procedure: CORONARY ARTERY BYPASS GRAFTING (CABG) x 4,  LIMA-MID LAD,  SVG-DIAGONAL,  SEQUENTIAL SVG -PDA, PLB #2;  Surgeon: Sudie VEAR Laine, MD;  Location: [facility] OR;  Service: Open Heart Surgery;  Laterality: N/A;   INTRAOPERATIVE TRANSESOPHAGEAL ECHOCARDIOGRAM N/A 05/20/2014   Procedure: INTRAOPERATIVE TRANSESOPHAGEAL ECHOCARDIOGRAM;  Surgeon: Sudie VEAR Laine, MD;  Location: [facility] OR;  Service: Open Heart Surgery;  Laterality: N/A;   LEFT HEART CATHETERIZATION WITH CORONARY ANGIOGRAM N/A 05/18/2014   Procedure: LEFT HEART CATHETERIZATION WITH CORONARY ANGIOGRAM;  Surgeon: Alm LELON Clay, MD;  Location: [facility] CATH LAB;  Service: Cardiovascular;  Laterality: N/A;   TRANSTHORACIC ECHOCARDIOGRAM  09/14/2014  Normal EF 55-60% with mild Conc LVH. No regional WMA, essentially normal   FAMILY HISTORY Family History  Problem Relation Age of Onset   Heart disease Brother        lives in [place]    Heart attack Neg Hx    Stroke Neg Hx    SOCIAL HISTORY Social History   Tobacco Use   Smoking status: Never   Smokeless tobacco: Never  Vaping Use   Vaping status: Never Used  Substance Use Topics   Alcohol use: Never   Drug use: Never       OPHTHALMIC  EXAM: Base Eye Exam     Visual Acuity (Snellen - Linear)       Right Left   Dist [place] 20/30 +2 20/30         Tonometry (Tonopen, 9:17 AM)       Right Left   Pressure 12 12         Pupils       Dark Light Shape React APD   Right 3 2 Round Brisk None   Left 3 2 Round Brisk None         Visual Fields (Counting fingers)       Left Right    Full Full         Extraocular Movement       Right Left    Full, Ortho Full, Ortho         Neuro/Psych     Oriented x3: Yes   Mood/Affect: Normal         Dilation     Both eyes: 1.0% Mydriacyl, 2.5% Phenylephrine  @ 9:16 AM           Slit Lamp and Fundus Exam     Slit Lamp Exam       Right Left   Lids/Lashes Dermatochalasis - upper lid Dermatochalasis - upper lid   Conjunctiva/Sclera White and quiet Mild melanosis   Cornea Arcus, Well healed cataract wound Arcus, Well healed cataract wound   Anterior Chamber Deep and quiet Deep and quiet   Iris Round and dilated round and poorly dilated   Lens PCIOL in good position PCIOL in good position   Anterior Vitreous Vitreous syneresis, PVD, vitreous condensations Vitreous syneresis, Posterior vitreous detachment, vitreous condensations, Weiss ring         Fundus Exam       Right Left   Disc mild pallor, sharp rim, mild PPA Sharp rim, mild temporal Pallor   C/D Ratio 0.3 0.4   Macula Flat, Good foveal reflex, RPE mottling, no heme or edema Good foveal reflex, trace cystic changes, IRH/CWS superior and temporal macula -- stably improved, +ERM SN macula   Vessels attenuated, Tortuous attenuated, Tortuous, +sclerosis ST arcades   Periphery Attached, no heme Attached, IRH superior midzone (extension of ST BRVO) -- improved, punctate exudates nasal to disc           IMAGING AND PROCEDURES  Imaging and Procedures for 07/20/2024          ASSESSMENT/PLAN:    ICD-10-CM   1. Branch retinal vein occlusion of left eye with macular edema  H34.8320 OCT, Retina  - OU - Both Eyes    2. Essential hypertension  I10     3. Hypertensive retinopathy of both eyes  H35.033     4. Epiretinal membrane (ERM) of left eye  H35.372     5. Pseudophakia of both eyes  Z96.1  1. BRVO w/ CME OS - s/p IVA OS #1 (9.21.22), #2 (10.19.22), #3 (11.16.22), #4 (12.28.22), #5 (02.22.23), #6 (06.07.23), #7 (07.26.23), #8 (09.13.23), #9 (10.18.23) -- IVA resistance =================== - s/p IVE OS #1 (11.22.23), #2 (01.02.24), #3 (02.14.24), #4 (03.20.24), #5 (04.24.24), #5 (05.29.24), #6 (07.09.24), #7 (08.13.24), #9 (09.10.24), #10 (10.08.24), #11 (11.05.24),  #12 (12.03.24), #13 (12.31.24), #14 (01.28.25), #15 (02.25.25), #16 (04.02.25), #17 (05.07.25) =================== - s/p IVV OS #1 (06.16.25) - pt was first seen on 8.10.22, but could not be treated with intravitreal injection because BP was extremely high R arm (199/122) and L arm (186/110) - pt was referred back to PCP, who has since restarted BP meds - FA (05.29.24) shows OS: Severe vascular attenuation ST quad with capillary drop out and +telangiectasias, prominent dilated and tortuous venule off nasal disc, no NV - BCVA OS 20/40 (stable) - OCT shows OS: persistent cystic changes/IRF perifoveal, stable IRA ST macula; persistent ERM with blunting of foveal contour at 5 weeks - IRA suggestive of BRAO component( **history of increased IRF at 5 wks, noted on 08.13.24** **history of increase in IRF at 8 weeks, noted on 04.19.23 OCT** **history of increase in IRF at 7 weeks, noted on 09.13.23 OCT** **discussed decreased efficacy / resistance to Eylea  and potential benefit of switching medication**  - recommend switching to IVV OS #2 today, 07.21.25 w/ f/u in 5 wks - pt wishes to proceed with injection of IVV - RBA of procedure discussed, questions answered  - IVV informed consent obtained and signed, 06.16.25 (OS) - see procedure note - Eylea  and Vabysmo  approved for 2025 - f/u in 5 weeks, DFE/OCT, possible  injection  2,3. Hypertensive retinopathy OU - BPs at presentation (8.10.22) were extremely high -- R arm (199/122) and L arm (186/110)  - pt was referred back to PCP, who has since restarted BP meds - discussed importance of tight BP control  4. Epiretinal membrane, left eye  - mild ERM - BCVA 20/30 -- stable - asymptomatic, no metamorphopsia - no indication for surgery at this time - monitor  5. Pseudophakia OU  - s/p CE/IOL OU (S. Groat)  - IOLs in good position, doing well - monitor  Ophthalmic Meds Ordered this visit:  No orders of the defined types were placed in this encounter.    No follow-ups on file.  There are no Patient Instructions on file for this visit.  This document serves as a record of services personally performed by Redell JUDITHANN Hans, MD, PhD. It was created on their behalf by Avelina Pereyra, COA an ophthalmic technician. The creation of this record is the provider's dictation and/or activities during the visit.   Electronically signed by: Avelina GORMAN Pereyra, COT  07/20/24  10:28 AM   This document serves as a record of services personally performed by Redell JUDITHANN Hans, MD, PhD. It was created on their behalf by Almetta Pesa, an ophthalmic technician. The creation of this record is the provider's dictation and/or activities during the visit.    Electronically signed by: Almetta Pesa, OA, 07/20/24  10:28 AM     Redell JUDITHANN Hans, M.D., Ph.D. Diseases & Surgery of the Retina and Vitreous [facility]    Abbreviations: M myopia (nearsighted); A astigmatism; H hyperopia (farsighted); P presbyopia; Mrx spectacle prescription;  CTL contact lenses; OD right eye; OS left eye; OU both eyes  XT exotropia; ET esotropia; PEK punctate epithelial keratitis; PEE punctate epithelial erosions; DES dry eye syndrome; MGD meibomian gland  dysfunction; ATs artificial tears; PFAT's preservative free artificial tears; NSC nuclear sclerotic cataract; PSC  posterior subcapsular cataract; ERM epi-retinal membrane; PVD posterior vitreous detachment; RD retinal detachment; DM diabetes mellitus; DR diabetic retinopathy; NPDR non-proliferative diabetic retinopathy; PDR proliferative diabetic retinopathy; CSME clinically significant macular edema; DME diabetic macular edema; dbh dot blot hemorrhages; CWS cotton wool spot; POAG primary open angle glaucoma; C/D cup-to-disc ratio; HVF humphrey visual field; GVF goldmann visual field; OCT optical coherence tomography; IOP intraocular pressure; BRVO Branch retinal vein occlusion; CRVO central retinal vein occlusion; CRAO central retinal artery occlusion; BRAO branch retinal artery occlusion; RT retinal tear; SB scleral buckle; PPV pars plana vitrectomy; VH Vitreous hemorrhage; PRP panretinal laser photocoagulation; IVK intravitreal kenalog; VMT vitreomacular traction; MH Macular hole;  NVD neovascularization of the disc; NVE neovascularization elsewhere; AREDS age related eye disease study; ARMD age related macular degeneration; POAG primary open angle glaucoma; EBMD epithelial/anterior basement membrane dystrophy; ACIOL anterior chamber intraocular lens; IOL intraocular lens; PCIOL posterior chamber intraocular lens; Phaco/IOL phacoemulsification with intraocular lens placement; PRK photorefractive keratectomy; LASIK laser assisted in situ keratomileusis; HTN hypertension; DM diabetes mellitus; COPD chronic obstructive pulmonary disease

## 2024-07-20 ENCOUNTER — Ambulatory Visit (INDEPENDENT_AMBULATORY_CARE_PROVIDER_SITE_OTHER): Admitting: Ophthalmology

## 2024-07-20 ENCOUNTER — Encounter (INDEPENDENT_AMBULATORY_CARE_PROVIDER_SITE_OTHER): Payer: Self-pay | Admitting: Ophthalmology

## 2024-07-20 DIAGNOSIS — Z961 Presence of intraocular lens: Secondary | ICD-10-CM

## 2024-07-20 DIAGNOSIS — H34832 Tributary (branch) retinal vein occlusion, left eye, with macular edema: Secondary | ICD-10-CM

## 2024-07-20 DIAGNOSIS — H35372 Puckering of macula, left eye: Secondary | ICD-10-CM

## 2024-07-20 DIAGNOSIS — I10 Essential (primary) hypertension: Secondary | ICD-10-CM | POA: Diagnosis not present

## 2024-07-20 DIAGNOSIS — H35033 Hypertensive retinopathy, bilateral: Secondary | ICD-10-CM

## 2024-07-20 MED ORDER — FARICIMAB-SVOA 6 MG/0.05ML IZ SOSY
6.0000 mg | PREFILLED_SYRINGE | INTRAVITREAL | Status: AC | PRN
  Administered 2024-07-20: 6 mg via INTRAVITREAL

## 2024-08-10 NOTE — Progress Notes (Signed)
 [facility] - Clinic Note  08/24/2024    CHIEF COMPLAINT Patient presents for Retina Follow Up  HISTORY OF PRESENT ILLNESS: Roger Howe is a 68 y.o. male who presents to the clinic today for:   HPI     Retina Follow Up   Patient presents with  CRVO/BRVO.  In left eye.  This started 5 weeks ago.        Comments   Patient here for 5 weeks retina follow up for BRVO OS. Patient states vision normal. No eye pain.      Last edited by Orval Asberry RAMAN, COA on 08/24/2024  8:43 AM.      Pt states VA does seems stable, no issues s/p inj OS last visit.    Referring physician: Octavia Charlie Hamilton, MD [address] [place],  [place] [address]  HISTORICAL INFORMATION:  Selected notes from the MEDICAL RECORD NUMBER Referred by Dr. Octavia for eval of BRVO OS   CURRENT MEDICATIONS: No current outpatient medications on file. (Ophthalmic Drugs)   No current facility-administered medications for this visit. (Ophthalmic Drugs)   Current Outpatient Medications (Other)  Medication Sig   aspirin  81 MG chewable tablet Chew 81 mg by mouth daily.   atorvastatin  (LIPITOR) 20 MG tablet Take 20 mg by mouth daily at 6 PM.   carvedilol  (COREG ) 3.125 MG tablet TAKE 1 TABLET BY MOUTH TWICE A DAY *PLS CALL OFFICE FOR ADDITIONAL REFILLS*   fenofibrate micronized (LOFIBRA) 134 MG capsule Take 134 mg by mouth daily.   lisinopril -hydrochlorothiazide  (ZESTORETIC ) 10-12.5 MG tablet Take 1 tablet by mouth daily.   metoprolol  succinate (TOPROL -XL) 25 MG 24 hr tablet Take 25 mg by mouth daily.   No current facility-administered medications for this visit. (Other)   REVIEW OF SYSTEMS: ROS   Positive for: Cardiovascular, Eyes Negative for: Constitutional, Gastrointestinal, Neurological, Skin, Genitourinary, Musculoskeletal, HENT, Endocrine, Respiratory, Psychiatric, Allergic/Imm, Heme/Lymph Last edited by Orval Asberry RAMAN, COA on 08/24/2024  8:43 AM.      ALLERGIES No Known  Allergies  PAST MEDICAL HISTORY Past Medical History:  Diagnosis Date   Abdominal aortic atherosclerosis (HCC)    Seen on catheterization   CAD, multiple vessel 05/18/2014   Coronary artery disease    Essential hypertension    Hypertensive retinopathy    NSTEMI (non-ST elevated myocardial infarction) (HCC) 05/17/2014   Post CABG Echo 09/19/2014: Normal EF 55-60% with mild Conc LVH. 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   LIMA to LAD, SVG to D1, Sequential SVG to PDA and RPL2, EVH via right thigh and leg   Past Surgical History:  Procedure Laterality Date   CARDIAC CATHETERIZATION  05/17/2014   DR HARDING: 95% proximal LAD, 90% ramus intermedius, 95% mid circumflex after OM1, neither percent RPDA   CORONARY ARTERY BYPASS GRAFT N/A 05/20/2014   Procedure: CORONARY ARTERY BYPASS GRAFTING (CABG) x 4,  LIMA-MID LAD,  SVG-DIAGONAL,  SEQUENTIAL SVG -PDA, PLB #2;  Surgeon: Sudie VEAR Laine, MD;  Location: [facility] OR;  Service: Open Heart Surgery;  Laterality: N/A;   INTRAOPERATIVE TRANSESOPHAGEAL ECHOCARDIOGRAM N/A 05/20/2014   Procedure: INTRAOPERATIVE TRANSESOPHAGEAL ECHOCARDIOGRAM;  Surgeon: Sudie VEAR Laine, MD;  Location: [facility] OR;  Service: Open Heart Surgery;  Laterality: N/A;   LEFT HEART CATHETERIZATION WITH CORONARY ANGIOGRAM N/A 05/18/2014   Procedure: LEFT HEART CATHETERIZATION WITH CORONARY ANGIOGRAM;  Surgeon: Alm LELON Clay, MD;  Location: [facility] CATH LAB;  Service: Cardiovascular;  Laterality: N/A;  TRANSTHORACIC ECHOCARDIOGRAM  09/14/2014   Normal EF 55-60% with mild Conc LVH. No regional WMA, essentially normal   FAMILY HISTORY Family History  Problem Relation Age of Onset   Heart disease Brother        lives in [place]    Heart attack Neg Hx    Stroke Neg Hx    SOCIAL HISTORY Social History   Tobacco Use   Smoking status: Never   Smokeless tobacco: Never  Vaping Use   Vaping status: Never Used  Substance Use Topics   Alcohol use: Never   Drug use: Never        OPHTHALMIC EXAM: Base Eye Exam     Visual Acuity (Snellen - Linear)       Right Left   Dist [place] 20/30 20/30 -2         Tonometry (Tonopen, 8:42 AM)       Right Left   Pressure 13 15         Pupils       Dark Light Shape React APD   Right 3 2 Round Brisk None   Left              Visual Fields (Counting fingers)       Left Right    Full Full         Extraocular Movement       Right Left    Full, Ortho Full, Ortho         Neuro/Psych     Oriented x3: Yes   Mood/Affect: Normal         Dilation     Both eyes: 1.0% Mydriacyl, 2.5% Phenylephrine  @ 8:41 AM           Slit Lamp and Fundus Exam     Slit Lamp Exam       Right Left   Lids/Lashes Dermatochalasis - upper lid Dermatochalasis - upper lid   Conjunctiva/Sclera White and quiet Mild melanosis   Cornea Arcus, Well healed cataract wound Arcus, Well healed cataract wound   Anterior Chamber Deep and quiet Deep and quiet   Iris Round and dilated round and poorly dilated   Lens PCIOL in good position PCIOL in good position   Anterior Vitreous Vitreous syneresis, PVD, vitreous condensations Vitreous syneresis, Posterior vitreous detachment, vitreous condensations, Weiss ring         Fundus Exam       Right Left   Disc mild pallor, sharp rim, mild PPA Sharp rim, mild temporal Pallor   C/D Ratio 0.3 0.4   Macula Flat, Good foveal reflex, RPE mottling, no heme or edema Good foveal reflex, trace cystic changes--slightly improved, IRH/CWS superior and temporal macula -- stably improved, +ERM SN macula   Vessels attenuated, Tortuous attenuated, Tortuous, +sclerosis ST arcades   Periphery Attached, no heme Attached, IRH superior midzone (extension of ST BRVO) -- improved, punctate exudates nasal to disc           IMAGING AND PROCEDURES  Imaging and Procedures for 08/24/2024  OCT, Retina - OU - Both Eyes       Right Eye Quality was good. Central Foveal Thickness: 268. Progression has  been stable. Findings include normal foveal contour, no IRF, no SRF, retinal drusen .   Left Eye Quality was good. Central Foveal Thickness: 303. Progression has improved. Findings include no SRF, abnormal foveal contour, intraretinal hyper-reflective material, cystoid macular edema, epiretinal membrane, intraretinal fluid, inner retinal atrophy (Trace persistent cystic changes/IRF perifovea--slightly improved,  stable IRA ST macula; persistent ERM with blunting of foveal contour).   Notes *Images captured and stored on drive  Diagnosis / Impression:  OD: NFP, no IRF/SRF OS: trace persistent cystic changes/IRF perifovea--slightly improved, stable IRA ST macula; persistent ERM with blunting of foveal contour  Clinical management:  See below  Abbreviations: NFP - Normal foveal profile. CME - cystoid macular edema. PED - pigment epithelial detachment. IRF - intraretinal fluid. SRF - subretinal fluid. EZ - ellipsoid zone. ERM - epiretinal membrane. ORA - outer retinal atrophy. ORT - outer retinal tubulation. SRHM - subretinal hyper-reflective material. IRHM - intraretinal hyper-reflective material             ASSESSMENT/PLAN:    ICD-10-CM   1. Branch retinal vein occlusion of left eye with macular edema  H34.8320 OCT, Retina - OU - Both Eyes    2. Essential hypertension  I10     3. Hypertensive retinopathy of both eyes  H35.033     4. Epiretinal membrane (ERM) of left eye  H35.372     5. Pseudophakia of both eyes  Z96.1       1. BRVO w/ CME OS - s/p IVA OS #1 (9.21.22), #2 (10.19.22), #3 (11.16.22), #4 (12.28.22), #5 (02.22.23), #6 (06.07.23), #7 (07.26.23), #8 (09.13.23), #9 (10.18.23) -- IVA resistance =================== - s/p IVE OS #1 (11.22.23), #2 (01.02.24), #3 (02.14.24), #4 (03.20.24), #5 (04.24.24), #5 (05.29.24), #6 (07.09.24), #7 (08.13.24), #9 (09.10.24), #10 (10.08.24), #11 (11.05.24),  #12 (12.03.24), #13 (12.31.24), #14 (01.28.25), #15 (02.25.25), #16 (04.02.25),  #17 (05.07.25) -- IVV resistance =================== - s/p IVV OS #1 (06.16.25), #2 (07.21.25) - pt was first seen on 8.10.22, but could not be treated with intravitreal injection because BP was extremely high R arm (199/122) and L arm (186/110) - pt was referred back to PCP, who has since restarted BP meds - FA (05.29.24) shows OS: Severe vascular attenuation ST quad with capillary drop out and +telangiectasias, prominent dilated and tortuous venule off nasal disc, no NV - BCVA OS 20/30 (stable) - OCT shows OS: Persistent cystic changes/IRF perifovea--slightly increased, stable IRA ST macula; persistent ERM with blunting of foveal contour at 5 weeks - IRA suggestive of BRAO component **history of increased IRF at 5 wks, noted on 08.13.24** **history of increase in IRF at 8 weeks, noted on 04.19.23 OCT** **history of increase in IRF at 7 weeks, noted on 09.13.23 OCT** - recommend IVV today OS #3 (08.25.25) w/ f/u in 5 wks - pt wishes to proceed with injection of IVV - RBA of procedure discussed, questions answered  - IVV informed consent obtained and signed, 06.16.25 (OS) - see procedure note - Eylea  and Vabysmo  approved for 2025 - f/u in 5 weeks, DFE/OCT, possible injection (s)  2,3. Hypertensive retinopathy OU - BPs at presentation (8.10.22) were extremely high -- R arm (199/122) and L arm (186/110)  - pt was referred back to PCP, who has since restarted BP meds - discussed importance of tight BP control  4. Epiretinal membrane, left eye  - mild ERM - BCVA 20/30 -- stable - asymptomatic, no metamorphopsia - no indication for surgery at this time - monitor  5. Pseudophakia OU  - s/p CE/IOL OU (S. Groat)  - IOLs in good position, doing well - monitor  Ophthalmic Meds Ordered this visit:  No orders of the defined types were placed in this encounter.    No follow-ups on file.  There are no Patient Instructions on file for this visit.  This  document serves as a record of  services personally performed by Redell JUDITHANN Hans, MD, PhD. It was created on their behalf by Avelina Pereyra, COA an ophthalmic technician. The creation of this record is the provider's dictation and/or activities during the visit.   Electronically signed by: Avelina GORMAN Pereyra, COT  08/24/24  9:02 AM   This document serves as a record of services personally performed by Redell JUDITHANN Hans, MD, PhD. It was created on their behalf by Almetta Pesa, an ophthalmic technician. The creation of this record is the provider's dictation and/or activities during the visit.    Electronically signed by: Almetta Pesa, OA, 08/24/24  9:06 AM   Redell JUDITHANN Hans, M.D., Ph.D. Diseases & Surgery of the Retina and Vitreous [facility]    Abbreviations: M myopia (nearsighted); A astigmatism; H hyperopia (farsighted); P presbyopia; Mrx spectacle prescription;  CTL contact lenses; OD right eye; OS left eye; OU both eyes  XT exotropia; ET esotropia; PEK punctate epithelial keratitis; PEE punctate epithelial erosions; DES dry eye syndrome; MGD meibomian gland dysfunction; ATs artificial tears; PFAT's preservative free artificial tears; NSC nuclear sclerotic cataract; PSC posterior subcapsular cataract; ERM epi-retinal membrane; PVD posterior vitreous detachment; RD retinal detachment; DM diabetes mellitus; DR diabetic retinopathy; NPDR non-proliferative diabetic retinopathy; PDR proliferative diabetic retinopathy; CSME clinically significant macular edema; DME diabetic macular edema; dbh dot blot hemorrhages; CWS cotton wool spot; POAG primary open angle glaucoma; C/D cup-to-disc ratio; HVF humphrey visual field; GVF goldmann visual field; OCT optical coherence tomography; IOP intraocular pressure; BRVO Branch retinal vein occlusion; CRVO central retinal vein occlusion; CRAO central retinal artery occlusion; BRAO branch retinal artery occlusion; RT retinal tear; SB scleral buckle; PPV pars plana  vitrectomy; VH Vitreous hemorrhage; PRP panretinal laser photocoagulation; IVK intravitreal kenalog; VMT vitreomacular traction; MH Macular hole;  NVD neovascularization of the disc; NVE neovascularization elsewhere; AREDS age related eye disease study; ARMD age related macular degeneration; POAG primary open angle glaucoma; EBMD epithelial/anterior basement membrane dystrophy; ACIOL anterior chamber intraocular lens; IOL intraocular lens; PCIOL posterior chamber intraocular lens; Phaco/IOL phacoemulsification with intraocular lens placement; PRK photorefractive keratectomy; LASIK laser assisted in situ keratomileusis; HTN hypertension; DM diabetes mellitus; COPD chronic obstructive pulmonary disease

## 2024-08-24 ENCOUNTER — Ambulatory Visit (INDEPENDENT_AMBULATORY_CARE_PROVIDER_SITE_OTHER): Admitting: Ophthalmology

## 2024-08-24 ENCOUNTER — Encounter (INDEPENDENT_AMBULATORY_CARE_PROVIDER_SITE_OTHER): Payer: Self-pay | Admitting: Ophthalmology

## 2024-08-24 DIAGNOSIS — H35372 Puckering of macula, left eye: Secondary | ICD-10-CM

## 2024-08-24 DIAGNOSIS — H35033 Hypertensive retinopathy, bilateral: Secondary | ICD-10-CM

## 2024-08-24 DIAGNOSIS — Z961 Presence of intraocular lens: Secondary | ICD-10-CM

## 2024-08-24 DIAGNOSIS — H34832 Tributary (branch) retinal vein occlusion, left eye, with macular edema: Secondary | ICD-10-CM

## 2024-08-24 DIAGNOSIS — I10 Essential (primary) hypertension: Secondary | ICD-10-CM

## 2024-08-24 MED ORDER — FARICIMAB-SVOA 6 MG/0.05ML IZ SOSY
6.0000 mg | PREFILLED_SYRINGE | INTRAVITREAL | Status: AC | PRN
  Administered 2024-08-24: 6 mg via INTRAVITREAL

## 2024-09-15 NOTE — Progress Notes (Signed)
 [facility] - Clinic Note  09/28/2024    CHIEF COMPLAINT Patient presents for Retina Follow Up  HISTORY OF PRESENT ILLNESS: Roger Howe is a 68 y.o. male who presents to the clinic today for:   HPI     Retina Follow Up   Patient presents with  Diabetic Retinopathy.  In left eye.  Severity is moderate.  Duration of 6 weeks.  Since onset it is stable.  I, the attending physician,  performed the HPI with the patient and updated documentation appropriately.        Comments   6 week Retina eval. Patient states vision is doing good      Last edited by Valdemar Rogue, MD on 09/28/2024 12:36 PM.       Pt states  VA the same, no changes.  Referring physician: Octavia Charlie Hamilton, MD [address] [place],  [place] [address]  HISTORICAL INFORMATION:  Selected notes from the MEDICAL RECORD NUMBER Referred by Dr. Octavia for eval of BRVO OS   CURRENT MEDICATIONS: No current outpatient medications on file. (Ophthalmic Drugs)   No current facility-administered medications for this visit. (Ophthalmic Drugs)   Current Outpatient Medications (Other)  Medication Sig   aspirin  81 MG chewable tablet Chew 81 mg by mouth daily.   atorvastatin  (LIPITOR) 20 MG tablet Take 20 mg by mouth daily at 6 PM.   carvedilol  (COREG ) 3.125 MG tablet TAKE 1 TABLET BY MOUTH TWICE A DAY *PLS CALL OFFICE FOR ADDITIONAL REFILLS*   fenofibrate micronized (LOFIBRA) 134 MG capsule Take 134 mg by mouth daily.   lisinopril -hydrochlorothiazide  (ZESTORETIC ) 10-12.5 MG tablet Take 1 tablet by mouth daily.   metoprolol  succinate (TOPROL -XL) 25 MG 24 hr tablet Take 25 mg by mouth daily.   No current facility-administered medications for this visit. (Other)   REVIEW OF SYSTEMS: ROS   Positive for: Cardiovascular, Eyes Negative for: Constitutional, Gastrointestinal, Neurological, Skin, Genitourinary, Musculoskeletal, HENT, Endocrine, Respiratory, Psychiatric, Allergic/Imm, Heme/Lymph Last  edited by German Olam BRAVO, COT on 09/28/2024  9:07 AM.       ALLERGIES No Known Allergies  PAST MEDICAL HISTORY Past Medical History:  Diagnosis Date   Abdominal aortic atherosclerosis    Seen on catheterization   CAD, multiple vessel 05/18/2014   Coronary artery disease    Essential hypertension    Hypertensive retinopathy    NSTEMI (non-ST elevated myocardial infarction) (HCC) 05/17/2014   Post CABG Echo 09/19/2014: Normal EF 55-60% with mild Conc LVH. 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   LIMA to LAD, SVG to D1, Sequential SVG to PDA and RPL2, EVH via right thigh and leg   Past Surgical History:  Procedure Laterality Date   CARDIAC CATHETERIZATION  05/17/2014   DR HARDING: 95% proximal LAD, 90% ramus intermedius, 95% mid circumflex after OM1, neither percent RPDA   CORONARY ARTERY BYPASS GRAFT N/A 05/20/2014   Procedure: CORONARY ARTERY BYPASS GRAFTING (CABG) x 4,  LIMA-MID LAD,  SVG-DIAGONAL,  SEQUENTIAL SVG -PDA, PLB #2;  Surgeon: Sudie VEAR Laine, MD;  Location: [facility] OR;  Service: Open Heart Surgery;  Laterality: N/A;   INTRAOPERATIVE TRANSESOPHAGEAL ECHOCARDIOGRAM N/A 05/20/2014   Procedure: INTRAOPERATIVE TRANSESOPHAGEAL ECHOCARDIOGRAM;  Surgeon: Sudie VEAR Laine, MD;  Location: [facility] OR;  Service: Open Heart Surgery;  Laterality: N/A;   LEFT HEART CATHETERIZATION WITH CORONARY ANGIOGRAM N/A 05/18/2014   Procedure: LEFT HEART CATHETERIZATION WITH CORONARY ANGIOGRAM;  Surgeon: Alm LELON Clay, MD;  Location: [facility]  CATH LAB;  Service: Cardiovascular;  Laterality: N/A;   TRANSTHORACIC ECHOCARDIOGRAM  09/14/2014   Normal EF 55-60% with mild Conc LVH. No regional WMA, essentially normal   FAMILY HISTORY Family History  Problem Relation Age of Onset   Heart disease Brother        lives in [place]    Heart attack Neg Hx    Stroke Neg Hx    SOCIAL HISTORY Social History   Tobacco Use   Smoking status: Never   Smokeless tobacco: Never  Vaping Use   Vaping status:  Never Used  Substance Use Topics   Alcohol use: Never   Drug use: Never       OPHTHALMIC EXAM: Base Eye Exam     Visual Acuity (Snellen - Linear)       Right Left   Dist [place] 20/25 -3 20/25 -2   Dist ph [place] 20/NI 20/NI         Tonometry (Tonopen, 9:15 AM)       Right Left   Pressure 10 12         Pupils       Dark Light Shape React APD   Right 3 2 Round Brisk None   Left 3 2 Round Brisk None         Visual Fields (Counting fingers)       Left Right    Full Full         Extraocular Movement       Right Left    Full, Ortho Full, Ortho         Neuro/Psych     Oriented x3: Yes   Mood/Affect: Normal         Dilation     Both eyes: 1.0% Mydriacyl, 2.5% Phenylephrine  @ 9:15 AM           Slit Lamp and Fundus Exam     Slit Lamp Exam       Right Left   Lids/Lashes Dermatochalasis - upper lid Dermatochalasis - upper lid   Conjunctiva/Sclera White and quiet Mild melanosis   Cornea Arcus, Well healed cataract wound Arcus, Well healed cataract wound   Anterior Chamber Deep and quiet Deep and quiet   Iris Round and dilated round and poorly dilated   Lens PCIOL in good position PCIOL in good position   Anterior Vitreous Vitreous syneresis, PVD, vitreous condensations Vitreous syneresis, Posterior vitreous detachment, vitreous condensations, Weiss ring         Fundus Exam       Right Left   Disc mild pallor, sharp rim, mild PPA Sharp rim, mild temporal Pallor   C/D Ratio 0.3 0.4   Macula Flat, Good foveal reflex, RPE mottling, no heme or edema Good foveal reflex, trace cystic changes, IRH/CWS superior and temporal macula -- stably improved, +ERM SN macula   Vessels attenuated, Tortuous attenuated, Tortuous, +sclerosis ST arcades   Periphery Attached, no heme Attached, IRH superior midzone (extension of ST BRVO) -- improved, focal MA and circinate exudates nasal to disc           IMAGING AND PROCEDURES  Imaging and Procedures for  09/28/2024  OCT, Retina - OU - Both Eyes       Right Eye Quality was good. Central Foveal Thickness: 269. Progression has been stable. Findings include normal foveal contour, no IRF, no SRF, retinal drusen .   Left Eye Quality was good. Central Foveal Thickness: 311. Progression has improved. Findings include no SRF, abnormal foveal  contour, intraretinal hyper-reflective material, cystoid macular edema, epiretinal membrane, intraretinal fluid, inner retinal atrophy (Persistent cystic changes/IRF perifovea--slightly improved, stable IRA ST macula; persistent ERM with blunting of foveal contour).   Notes *Images captured and stored on drive  Diagnosis / Impression:  OD: NFP, no IRF/SRF OS: Persistent cystic changes/IRF perifovea--slightly improved, stable IRA ST macula; persistent ERM with blunting of foveal contour  Clinical management:  See below  Abbreviations: NFP - Normal foveal profile. CME - cystoid macular edema. PED - pigment epithelial detachment. IRF - intraretinal fluid. SRF - subretinal fluid. EZ - ellipsoid zone. ERM - epiretinal membrane. ORA - outer retinal atrophy. ORT - outer retinal tubulation. SRHM - subretinal hyper-reflective material. IRHM - intraretinal hyper-reflective material      Intravitreal Injection, Pharmacologic Agent - OS - Left Eye       Time Out 09/29/2025. 9:34 AM. Confirmed correct patient, procedure, site, and patient consented.   Anesthesia Topical anesthesia was used. Anesthetic medications included Lidocaine  2%, Proparacaine 0.5%.   Procedure Preparation included 5% betadine to ocular surface, eyelid speculum. A supplied (32g) needle was used.   Injection: 6 mg faricimab -svoa 6 MG/0.05ML   Route: Intravitreal, Site: Left Eye   NDC: 49757-903-93, Lot: [id number], Expiration date: 09/29/2025, Waste: 0 mL   Post-op Post injection exam found visual acuity of at least counting fingers. The patient tolerated the procedure well. There were no  complications. The patient received written and verbal post procedure care education. Post injection medications were not given.             ASSESSMENT/PLAN:    ICD-10-CM   1. Branch retinal vein occlusion of left eye with macular edema  H34.8320 OCT, Retina - OU - Both Eyes    Intravitreal Injection, Pharmacologic Agent - OS - Left Eye    faricimab -svoa (VABYSMO ) 6mg /0.50mL intravitreal injection    2. Essential hypertension  I10     3. Hypertensive retinopathy of both eyes  H35.033     4. Epiretinal membrane (ERM) of left eye  H35.372     5. Pseudophakia of both eyes  Z96.1       1. BRVO w/ CME OS - s/p IVA OS #1 (9.21.22), #2 (10.19.22), #3 (11.16.22), #4 (12.28.22), #5 (02.22.23), #6 (06.07.23), #7 (07.26.23), #8 (09.13.23), #9 (10.18.23) -- IVA resistance =================== - s/p IVE OS #1 (11.22.23), #2 (01.02.24), #3 (02.14.24), #4 (03.20.24), #5 (04.24.24), #5 (05.29.24), #6 (07.09.24), #7 (08.13.24), #9 (09.10.24), #10 (10.08.24), #11 (11.05.24),  #12 (12.03.24), #13 (12.31.24), #14 (01.28.25), #15 (02.25.25), #16 (04.02.25), #17 (05.07.25) -- IVV resistance =================== - s/p IVV OS #1 (06.16.25), #2 (07.21.25), #3 (08.25.25)  - pt was first seen on 8.10.22, but could not be treated with intravitreal injection because BP was extremely high R arm (199/122) and L arm (186/110) - pt was referred back to PCP, who has since restarted BP meds - FA (05.29.24) shows OS: Severe vascular attenuation ST quad with capillary drop out and +telangiectasias, prominent dilated and tortuous venule off nasal disc, no NV - BCVA OS 20/25--improved from 20/30 - OCT shows OS: Persistent cystic changes/IRF perifovea--slightly improved, stable IRA ST macula; persistent ERM with blunting of foveal contour at 5 weeks - IRA suggestive of BRAO component **history of increased IRF at 5 wks, noted on 08.13.24** IVE **history of increase in IRF at 8 weeks, noted on 04.19.23 OCT**  IVE **history of increase in IRF at 7 weeks, noted on 09.13.23 OCT** IVA - recommend IVV today OS #4 (09.29.25) w/ f/u in  5 wks - pt wishes to proceed with injection of IVV - RBA of procedure discussed, questions answered  - IVV informed consent obtained and signed, 06.16.25 (OS) - see procedure note - Eylea  and Vabysmo  approved for 2025 - f/u in 5 weeks, DFE/OCT, possible injection(s)  2,3. Hypertensive retinopathy OU - BPs at presentation (8.10.22) were extremely high -- R arm (199/122) and L arm (186/110)  - pt was referred back to PCP, who has since restarted BP meds - discussed importance of tight BP control  4. Epiretinal membrane, left eye  - mild ERM - BCVA 20/25 -- improved from 20/30 - asymptomatic, no metamorphopsia - no indication for surgery at this time - monitor  5. Pseudophakia OU  - s/p CE/IOL OU (S. [place])  - IOLs in good position, doing well - monitor  Ophthalmic Meds Ordered this visit:  Meds ordered this encounter  Medications   faricimab -svoa (VABYSMO ) 6mg /0.102mL intravitreal injection     Return in about 5 weeks (around 11/02/2024) for BRVO OS, DFE, OCT, Possible Injxn.  There are no Patient Instructions on file for this visit.  This document serves as a record of services personally performed by Redell JUDITHANN Hans, MD, PhD. It was created on their behalf by Avelina Pereyra, COA an ophthalmic technician. The creation of this record is the provider's dictation and/or activities during the visit.   Electronically signed by: Avelina GORMAN Pereyra, COT  09/28/24  6:45 PM    This document serves as a record of services personally performed by Redell JUDITHANN Hans, MD, PhD. It was created on their behalf by Almetta Pesa, an ophthalmic technician. The creation of this record is the provider's dictation and/or activities during the visit.    Electronically signed by: Almetta Pesa, OA, 09/28/24  6:45 PM   Redell JUDITHANN Hans, M.D., Ph.D. Diseases & Surgery of the  Retina and Vitreous [facility]  I have reviewed the above documentation for accuracy and completeness, and I agree with the above. Redell JUDITHANN Hans, M.D., Ph.D. 09/28/24 6:45 PM   Abbreviations: M myopia (nearsighted); A astigmatism; H hyperopia (farsighted); P presbyopia; Mrx spectacle prescription;  CTL contact lenses; OD right eye; OS left eye; OU both eyes  XT exotropia; ET esotropia; PEK punctate epithelial keratitis; PEE punctate epithelial erosions; DES dry eye syndrome; MGD meibomian gland dysfunction; ATs artificial tears; PFAT's preservative free artificial tears; NSC nuclear sclerotic cataract; PSC posterior subcapsular cataract; ERM epi-retinal membrane; PVD posterior vitreous detachment; RD retinal detachment; DM diabetes mellitus; DR diabetic retinopathy; NPDR non-proliferative diabetic retinopathy; PDR proliferative diabetic retinopathy; CSME clinically significant macular edema; DME diabetic macular edema; dbh dot blot hemorrhages; CWS cotton wool spot; POAG primary open angle glaucoma; C/D cup-to-disc ratio; HVF humphrey visual field; GVF goldmann visual field; OCT optical coherence tomography; IOP intraocular pressure; BRVO Branch retinal vein occlusion; CRVO central retinal vein occlusion; CRAO central retinal artery occlusion; BRAO branch retinal artery occlusion; RT retinal tear; SB scleral buckle; PPV pars plana vitrectomy; VH Vitreous hemorrhage; PRP panretinal laser photocoagulation; IVK intravitreal kenalog; VMT vitreomacular traction; MH Macular hole;  NVD neovascularization of the disc; NVE neovascularization elsewhere; AREDS age related eye disease study; ARMD age related macular degeneration; POAG primary open angle glaucoma; EBMD epithelial/anterior basement membrane dystrophy; ACIOL anterior chamber intraocular lens; IOL intraocular lens; PCIOL posterior chamber intraocular lens; Phaco/IOL phacoemulsification with intraocular lens placement; PRK  photorefractive keratectomy; LASIK laser assisted in situ keratomileusis; HTN hypertension; DM diabetes mellitus; COPD chronic obstructive pulmonary disease

## 2024-09-28 ENCOUNTER — Ambulatory Visit (INDEPENDENT_AMBULATORY_CARE_PROVIDER_SITE_OTHER): Admitting: Ophthalmology

## 2024-09-28 ENCOUNTER — Encounter (INDEPENDENT_AMBULATORY_CARE_PROVIDER_SITE_OTHER): Payer: Self-pay | Admitting: Ophthalmology

## 2024-09-28 DIAGNOSIS — H34832 Tributary (branch) retinal vein occlusion, left eye, with macular edema: Secondary | ICD-10-CM

## 2024-09-28 DIAGNOSIS — H35033 Hypertensive retinopathy, bilateral: Secondary | ICD-10-CM

## 2024-09-28 DIAGNOSIS — I10 Essential (primary) hypertension: Secondary | ICD-10-CM

## 2024-09-28 DIAGNOSIS — H35372 Puckering of macula, left eye: Secondary | ICD-10-CM | POA: Diagnosis not present

## 2024-09-28 DIAGNOSIS — Z961 Presence of intraocular lens: Secondary | ICD-10-CM

## 2024-09-28 MED ORDER — FARICIMAB-SVOA 6 MG/0.05ML IZ SOSY
6.0000 mg | PREFILLED_SYRINGE | INTRAVITREAL | Status: AC | PRN
  Administered 2024-09-28: 6 mg via INTRAVITREAL

## 2024-10-19 NOTE — Progress Notes (Signed)
 [facility] - Clinic Note  11/02/2024    CHIEF COMPLAINT Patient presents for Retina Follow Up  HISTORY OF PRESENT ILLNESS: Roger Howe is a 68 y.o. male who presents to the clinic today for:   HPI     Retina Follow Up   Patient presents with  CRVO/BRVO.  In left eye.  This started 3 years ago.  Severity is moderate.  Duration of 5 weeks.  I, the attending physician,  performed the HPI with the patient and updated documentation appropriately.        Comments   Pt states his left eye is feeling sticky, he uses ats occasionally and they provide temporary relief. Pt denies any changes in vision/no FOL/floaters/pain.       Last edited by Valdemar Rogue, MD on 11/02/2024  1:41 PM.     Patient states he is not having any issues with his vision.   Referring physician: Octavia Charlie Hamilton, MD [address] [place], [place] [address]  HISTORICAL INFORMATION:  Selected notes from the MEDICAL RECORD NUMBER Referred by Dr. Octavia for eval of BRVO OS   CURRENT MEDICATIONS: No current outpatient medications on file. (Ophthalmic Drugs)   No current facility-administered medications for this visit. (Ophthalmic Drugs)   Current Outpatient Medications (Other)  Medication Sig   aspirin  81 MG chewable tablet Chew 81 mg by mouth daily.   atorvastatin  (LIPITOR) 20 MG tablet Take 20 mg by mouth daily at 6 PM.   carvedilol  (COREG ) 3.125 MG tablet TAKE 1 TABLET BY MOUTH TWICE A DAY *PLS CALL OFFICE FOR ADDITIONAL REFILLS*   fenofibrate micronized (LOFIBRA) 134 MG capsule Take 134 mg by mouth daily.   lisinopril -hydrochlorothiazide  (ZESTORETIC ) 10-12.5 MG tablet Take 1 tablet by mouth daily.   metoprolol  succinate (TOPROL -XL) 25 MG 24 hr tablet Take 25 mg by mouth daily.   No current facility-administered medications for this visit. (Other)   REVIEW OF SYSTEMS: ROS   Positive for: Cardiovascular, Eyes Negative for: Constitutional, Gastrointestinal, Neurological, Skin,  Genitourinary, Musculoskeletal, HENT, Endocrine, Respiratory, Psychiatric, Allergic/Imm, Heme/Lymph Last edited by Elnor Avelina RAMAN, COT on 11/02/2024  9:13 AM.     ALLERGIES No Known Allergies  PAST MEDICAL HISTORY Past Medical History:  Diagnosis Date   Abdominal aortic atherosclerosis    Seen on catheterization   CAD, multiple vessel 05/18/2014   Coronary artery disease    Essential hypertension    Hypertensive retinopathy    NSTEMI (non-ST elevated myocardial infarction) (HCC) 05/17/2014   Post CABG Echo 09/19/2014: Normal EF 55-60% with mild Conc LVH. 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   LIMA to LAD, SVG to D1, Sequential SVG to PDA and RPL2, EVH via right thigh and leg   Past Surgical History:  Procedure Laterality Date   CARDIAC CATHETERIZATION  05/17/2014   DR HARDING: 95% proximal LAD, 90% ramus intermedius, 95% mid circumflex after OM1, neither percent RPDA   CORONARY ARTERY BYPASS GRAFT N/A 05/20/2014   Procedure: CORONARY ARTERY BYPASS GRAFTING (CABG) x 4,  LIMA-MID LAD,  SVG-DIAGONAL,  SEQUENTIAL SVG -PDA, PLB #2;  Surgeon: Sudie VEAR Laine, MD;  Location: [facility] OR;  Service: Open Heart Surgery;  Laterality: N/A;   INTRAOPERATIVE TRANSESOPHAGEAL ECHOCARDIOGRAM N/A 05/20/2014   Procedure: INTRAOPERATIVE TRANSESOPHAGEAL ECHOCARDIOGRAM;  Surgeon: Sudie VEAR Laine, MD;  Location: [facility] OR;  Service: Open Heart Surgery;  Laterality: N/A;   LEFT HEART CATHETERIZATION WITH CORONARY ANGIOGRAM N/A 05/18/2014   Procedure: LEFT  HEART CATHETERIZATION WITH CORONARY ANGIOGRAM;  Surgeon: Alm LELON Clay, MD;  Location: [facility] CATH LAB;  Service: Cardiovascular;  Laterality: N/A;   TRANSTHORACIC ECHOCARDIOGRAM  09/14/2014   Normal EF 55-60% with mild Conc LVH. No regional WMA, essentially normal   FAMILY HISTORY Family History  Problem Relation Age of Onset   Heart disease Brother        lives in [place]    Heart attack Neg Hx    Stroke Neg Hx    SOCIAL HISTORY Social  History   Tobacco Use   Smoking status: Never   Smokeless tobacco: Never  Vaping Use   Vaping status: Never Used  Substance Use Topics   Alcohol use: Never   Drug use: Never       OPHTHALMIC EXAM: Base Eye Exam     Visual Acuity (Snellen - Linear)       Right Left   Dist [place] 20/30 -2 20/40 +2   Dist ph [place] 20/25 -2 20/30 +2         Tonometry (Tonopen, 9:23 AM)       Right Left   Pressure 12 16         Pupils       Pupils Dark Light Shape React APD   Right PERRL 3 2 Round Minimal None   Left PERRL 3 2 Round Minimal None         Visual Fields       Left Right    Full Full         Extraocular Movement       Right Left    Full, Ortho Full, Ortho         Neuro/Psych     Oriented x3: Yes   Mood/Affect: Normal         Dilation     Both eyes: 1.0% Mydriacyl, 2.5% Phenylephrine  @ 9:23 AM           Slit Lamp and Fundus Exam     Slit Lamp Exam       Right Left   Lids/Lashes Dermatochalasis - upper lid Dermatochalasis - upper lid   Conjunctiva/Sclera White and quiet Mild melanosis   Cornea Arcus, Well healed cataract wound Arcus, Well healed cataract wound   Anterior Chamber Deep and quiet Deep and quiet   Iris Round and dilated round and poorly dilated   Lens PCIOL in good position PCIOL in good position   Anterior Vitreous Vitreous syneresis, PVD, vitreous condensations Vitreous syneresis, Posterior vitreous detachment, vitreous condensations, Weiss ring         Fundus Exam       Right Left   Disc mild pallor, sharp rim, mild PPA Sharp rim, mild temporal Pallor   C/D Ratio 0.3 0.4   Macula Flat, Good foveal reflex, RPE mottling, no heme or edema Good foveal reflex, trace cystic changes, IRH/CWS superior and temporal macula -- stably improved, +ERM SN macula   Vessels attenuated, Tortuous attenuated, Tortuous, +sclerosis ST arcades   Periphery Attached, no heme Attached, IRH superior midzone (extension of ST BRVO) -- improved, focal  MA and circinate exudates nasal to disc- improving           IMAGING AND PROCEDURES  Imaging and Procedures for 11/02/2024  OCT, Retina - OU - Both Eyes       Right Eye Quality was good. Central Foveal Thickness: 270. Progression has been stable. Findings include normal foveal contour, no IRF, no SRF, retinal drusen .  Left Eye Quality was good. Central Foveal Thickness: 316. Progression has improved. Findings include no SRF, abnormal foveal contour, intraretinal hyper-reflective material, cystoid macular edema, epiretinal membrane, intraretinal fluid, inner retinal atrophy (Persistent cystic changes/IRF perifovea--slightly improved, stable IRA ST macula; persistent ERM with blunting of foveal contour).   Notes *Images captured and stored on drive  Diagnosis / Impression:  OD: NFP, no IRF/SRF OS: Persistent cystic changes/IRF perifovea--slightly improved, stable IRA ST macula; persistent ERM with blunting of foveal contour  Clinical management:  See below  Abbreviations: NFP - Normal foveal profile. CME - cystoid macular edema. PED - pigment epithelial detachment. IRF - intraretinal fluid. SRF - subretinal fluid. EZ - ellipsoid zone. ERM - epiretinal membrane. ORA - outer retinal atrophy. ORT - outer retinal tubulation. SRHM - subretinal hyper-reflective material. IRHM - intraretinal hyper-reflective material      Intravitreal Injection, Pharmacologic Agent - OS - Left Eye       Time Out 11/02/2024. 11:00 AM. Confirmed correct patient, procedure, site, and patient consented.   Anesthesia Topical anesthesia was used. Anesthetic medications included Lidocaine  2%, Proparacaine 0.5%.   Procedure Preparation included 5% betadine to ocular surface, eyelid speculum. A supplied (32g) needle was used.   Injection: 6 mg faricimab -svoa 6 MG/0.05ML   Route: Intravitreal, Site: Left Eye   NDC: 49757-903-93, Lot: [id number], Expiration date: 09/29/2025, Waste: 0 mL   Post-op Post  injection exam found visual acuity of at least counting fingers. The patient tolerated the procedure well. There were no complications. The patient received written and verbal post procedure care education. Post injection medications were not given.              ASSESSMENT/PLAN:    ICD-10-CM   1. Branch retinal vein occlusion of left eye with macular edema (HCC)  H34.8320 OCT, Retina - OU - Both Eyes    Intravitreal Injection, Pharmacologic Agent - OS - Left Eye    faricimab -svoa (VABYSMO ) 6mg /0.48mL intravitreal injection    2. Essential hypertension  I10     3. Hypertensive retinopathy of both eyes  H35.033     4. Epiretinal membrane (ERM) of left eye  H35.372     5. Pseudophakia of both eyes  Z96.1      1. BRVO w/ CME OS - s/p IVA OS #1 (9.21.22), #2 (10.19.22), #3 (11.16.22), #4 (12.28.22), #5 (02.22.23), #6 (06.07.23), #7 (07.26.23), #8 (09.13.23), #9 (10.18.23) -- IVA resistance =================== - s/p IVE OS #1 (11.22.23), #2 (01.02.24), #3 (02.14.24), #4 (03.20.24), #5 (04.24.24), #5 (05.29.24), #6 (07.09.24), #7 (08.13.24), #9 (09.10.24), #10 (10.08.24), #11 (11.05.24),  #12 (12.03.24), #13 (12.31.24), #14 (01.28.25), #15 (02.25.25), #16 (04.02.25), #17 (05.07.25) -- IVV resistance =================== - s/p IVV OS #1 (06.16.25), #2 (07.21.25), #3 (08.25.25), #4 (09.29.25) - pt was first seen on 8.10.22, but could not be treated with intravitreal injection because BP was extremely high R arm (199/122) and L arm (186/110) - pt was referred back to PCP, who has since restarted BP meds - FA (05.29.24) shows OS: Severe vascular attenuation ST quad with capillary drop out and +telangiectasias, prominent dilated and tortuous venule off nasal disc, no NV - BCVA OS 20/30 from 20/25 - OCT shows OS: Persistent cystic changes/IRF perifovea--slightly improved, stable IRA ST macula; persistent ERM with blunting of foveal contour at 5 weeks - IRA suggestive of BRAO  component **history of increased IRF at 5 wks, noted on 08.13.24** IVE **history of increase in IRF at 8 weeks, noted on 04.19.23 OCT** IVE **history of increase  in IRF at 7 weeks, noted on 09.13.23 OCT** IVA - recommend IVV today OS #5 (11.04.25) w/ f/u in 5 wks - pt wishes to proceed with injection of IVV - RBA of procedure discussed, questions answered  - IVV informed consent obtained and signed, 06.16.25 (OS) - see procedure note - Eylea  and Vabysmo  approved for 2025 - f/u in 5 weeks, DFE/OCT, possible injection(s)  2,3. Hypertensive retinopathy OU - BPs at presentation (8.10.22) were extremely high -- R arm (199/122) and L arm (186/110)  - pt was referred back to PCP, who has since restarted BP meds - discussed importance of tight BP control  4. Epiretinal membrane, left eye  - mild ERM - BCVA 20/25 -- improved from 20/30 - asymptomatic, no metamorphopsia - no indication for surgery at this time - monitor  5. Pseudophakia OU  - s/p CE/IOL OU (S. [place])  - IOLs in good position, doing well - monitor  Ophthalmic Meds Ordered this visit:  Meds ordered this encounter  Medications   faricimab -svoa (VABYSMO ) 6mg /0.61mL intravitreal injection     Return in about 5 weeks (around 12/07/2024) for f/u, BRVO, DFE, OCT, Possible, IVV, OS.  There are no Patient Instructions on file for this visit.  This document serves as a record of services personally performed by Redell JUDITHANN Hans, MD, PhD. It was created on their behalf by Avelina Pereyra, COA an ophthalmic technician. The creation of this record is the provider's dictation and/or activities during the visit.   Electronically signed by: Avelina GORMAN Pereyra, COT  11/08/24  4:02 PM   This document serves as a record of services personally performed by Redell JUDITHANN Hans, MD, PhD. It was created on their behalf by Wanda GEANNIE Keens, COT an ophthalmic technician. The creation of this record is the provider's dictation and/or activities during  the visit.    Electronically signed by:  Wanda GEANNIE Keens, COT  11/08/24 4:02 PM  Redell JUDITHANN Hans, M.D., Ph.D. Diseases & Surgery of the Retina and Vitreous [facility]  I have reviewed the above documentation for accuracy and completeness, and I agree with the above. Redell JUDITHANN Hans, M.D., Ph.D. 11/08/24 4:05 PM   Abbreviations: M myopia (nearsighted); A astigmatism; H hyperopia (farsighted); P presbyopia; Mrx spectacle prescription;  CTL contact lenses; OD right eye; OS left eye; OU both eyes  XT exotropia; ET esotropia; PEK punctate epithelial keratitis; PEE punctate epithelial erosions; DES dry eye syndrome; MGD meibomian gland dysfunction; ATs artificial tears; PFAT's preservative free artificial tears; NSC nuclear sclerotic cataract; PSC posterior subcapsular cataract; ERM epi-retinal membrane; PVD posterior vitreous detachment; RD retinal detachment; DM diabetes mellitus; DR diabetic retinopathy; NPDR non-proliferative diabetic retinopathy; PDR proliferative diabetic retinopathy; CSME clinically significant macular edema; DME diabetic macular edema; dbh dot blot hemorrhages; CWS cotton wool spot; POAG primary open angle glaucoma; C/D cup-to-disc ratio; HVF humphrey visual field; GVF goldmann visual field; OCT optical coherence tomography; IOP intraocular pressure; BRVO Branch retinal vein occlusion; CRVO central retinal vein occlusion; CRAO central retinal artery occlusion; BRAO branch retinal artery occlusion; RT retinal tear; SB scleral buckle; PPV pars plana vitrectomy; VH Vitreous hemorrhage; PRP panretinal laser photocoagulation; IVK intravitreal kenalog; VMT vitreomacular traction; MH Macular hole;  NVD neovascularization of the disc; NVE neovascularization elsewhere; AREDS age related eye disease study; ARMD age related macular degeneration; POAG primary open angle glaucoma; EBMD epithelial/anterior basement membrane dystrophy; ACIOL anterior chamber intraocular  lens; IOL intraocular lens; PCIOL posterior chamber intraocular lens; Phaco/IOL phacoemulsification with intraocular  lens placement; PRK photorefractive keratectomy; LASIK laser assisted in situ keratomileusis; HTN hypertension; DM diabetes mellitus; COPD chronic obstructive pulmonary disease

## 2024-11-02 ENCOUNTER — Encounter (INDEPENDENT_AMBULATORY_CARE_PROVIDER_SITE_OTHER): Payer: Self-pay | Admitting: Ophthalmology

## 2024-11-02 ENCOUNTER — Ambulatory Visit (INDEPENDENT_AMBULATORY_CARE_PROVIDER_SITE_OTHER): Admitting: Ophthalmology

## 2024-11-02 DIAGNOSIS — H34832 Tributary (branch) retinal vein occlusion, left eye, with macular edema: Secondary | ICD-10-CM

## 2024-11-02 DIAGNOSIS — H35372 Puckering of macula, left eye: Secondary | ICD-10-CM

## 2024-11-02 DIAGNOSIS — I10 Essential (primary) hypertension: Secondary | ICD-10-CM | POA: Diagnosis not present

## 2024-11-02 DIAGNOSIS — H35033 Hypertensive retinopathy, bilateral: Secondary | ICD-10-CM

## 2024-11-02 DIAGNOSIS — Z961 Presence of intraocular lens: Secondary | ICD-10-CM

## 2024-11-02 MED ORDER — FARICIMAB-SVOA 6 MG/0.05ML IZ SOSY
6.0000 mg | PREFILLED_SYRINGE | INTRAVITREAL | Status: AC | PRN
  Administered 2024-11-02: 6 mg via INTRAVITREAL

## 2024-11-23 NOTE — Progress Notes (Signed)
 [facility] - Clinic Note  12/07/2024    CHIEF COMPLAINT Patient presents for Retina Follow Up  HISTORY OF PRESENT ILLNESS: Roger Howe is a 68 y.o. male who presents to the clinic today for:   HPI     Retina Follow Up   Patient presents with  CRVO/BRVO.  In both eyes.  This started 5 weeks ago.  Duration of 5 weeks.  Since onset it is stable.        Comments   5 week retina follow up BRVO and IVV OS pt is reporting no vision changes noticed she denies any flashes or floaters       Last edited by Resa Delon ORN, COT on 12/07/2024  9:26 AM.     Patient states he is not having any issues with his vision.   Referring physician: Octavia Charlie Hamilton, MD [address] [place], [place] [address]  HISTORICAL INFORMATION:  Selected notes from the MEDICAL RECORD NUMBER Referred by Dr. Octavia for eval of BRVO OS   CURRENT MEDICATIONS: No current outpatient medications on file. (Ophthalmic Drugs)   No current facility-administered medications for this visit. (Ophthalmic Drugs)   Current Outpatient Medications (Other)  Medication Sig   aspirin  81 MG chewable tablet Chew 81 mg by mouth daily.   atorvastatin  (LIPITOR) 20 MG tablet Take 20 mg by mouth daily at 6 PM.   carvedilol  (COREG ) 3.125 MG tablet TAKE 1 TABLET BY MOUTH TWICE A DAY *PLS CALL OFFICE FOR ADDITIONAL REFILLS*   fenofibrate micronized (LOFIBRA) 134 MG capsule Take 134 mg by mouth daily.   lisinopril -hydrochlorothiazide  (ZESTORETIC ) 10-12.5 MG tablet Take 1 tablet by mouth daily.   metoprolol  succinate (TOPROL -XL) 25 MG 24 hr tablet Take 25 mg by mouth daily.   No current facility-administered medications for this visit. (Other)   REVIEW OF SYSTEMS: ROS   Positive for: Cardiovascular, Eyes Negative for: Constitutional, Gastrointestinal, Neurological, Skin, Genitourinary, Musculoskeletal, HENT, Endocrine, Respiratory, Psychiatric, Allergic/Imm, Heme/Lymph Last edited by Resa Delon ORN, COT on 12/07/2024  9:26 AM.      ALLERGIES No Known Allergies  PAST MEDICAL HISTORY Past Medical History:  Diagnosis Date   Abdominal aortic atherosclerosis    Seen on catheterization   CAD, multiple vessel 05/18/2014   Coronary artery disease    Essential hypertension    Hypertensive retinopathy    NSTEMI (non-ST elevated myocardial infarction) (HCC) 05/17/2014   Post CABG Echo 09/19/2014: Normal EF 55-60% with mild Conc LVH. 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   LIMA to LAD, SVG to D1, Sequential SVG to PDA and RPL2, EVH via right thigh and leg   Past Surgical History:  Procedure Laterality Date   CARDIAC CATHETERIZATION  05/17/2014   DR HARDING: 95% proximal LAD, 90% ramus intermedius, 95% mid circumflex after OM1, neither percent RPDA   CORONARY ARTERY BYPASS GRAFT N/A 05/20/2014   Procedure: CORONARY ARTERY BYPASS GRAFTING (CABG) x 4,  LIMA-MID LAD,  SVG-DIAGONAL,  SEQUENTIAL SVG -PDA, PLB #2;  Surgeon: Sudie VEAR Laine, MD;  Location: [facility] OR;  Service: Open Heart Surgery;  Laterality: N/A;   INTRAOPERATIVE TRANSESOPHAGEAL ECHOCARDIOGRAM N/A 05/20/2014   Procedure: INTRAOPERATIVE TRANSESOPHAGEAL ECHOCARDIOGRAM;  Surgeon: Sudie VEAR Laine, MD;  Location: [facility] OR;  Service: Open Heart Surgery;  Laterality: N/A;   LEFT HEART CATHETERIZATION WITH CORONARY ANGIOGRAM N/A 05/18/2014   Procedure: LEFT HEART CATHETERIZATION WITH CORONARY ANGIOGRAM;  Surgeon: Alm ORN Clay, MD;  Location: [facility]  CATH LAB;  Service: Cardiovascular;  Laterality: N/A;   TRANSTHORACIC ECHOCARDIOGRAM  09/14/2014   Normal EF 55-60% with mild Conc LVH. No regional WMA, essentially normal   FAMILY HISTORY Family History  Problem Relation Age of Onset   Heart disease Brother        lives in [place]    Heart attack Neg Hx    Stroke Neg Hx    SOCIAL HISTORY Social History   Tobacco Use   Smoking status: Never   Smokeless tobacco: Never  Vaping Use   Vaping status: Never Used   Substance Use Topics   Alcohol use: Never   Drug use: Never       OPHTHALMIC EXAM: Base Eye Exam     Visual Acuity (Snellen - Linear)       Right Left   Dist [place] 20/30 -1 20/40 -1   Dist ph [place] NI 20/30 -1         Tonometry (Tonopen, 9:33 AM)       Right Left   Pressure 15 16         Pupils       Pupils Dark Light Shape React APD   Right PERRL 3 2 Round Brisk None   Left PERRL 3 2 Round Brisk None         Visual Fields       Left Right    Full Full         Extraocular Movement       Right Left    Full, Ortho Full, Ortho         Neuro/Psych     Oriented x3: Yes   Mood/Affect: Normal         Dilation     Both eyes: 2.5% Phenylephrine  @ 9:33 AM           Slit Lamp and Fundus Exam     Slit Lamp Exam       Right Left   Lids/Lashes Dermatochalasis - upper lid Dermatochalasis - upper lid   Conjunctiva/Sclera White and quiet Mild melanosis   Cornea Arcus, Well healed cataract wound Arcus, Well healed cataract wound   Anterior Chamber Deep and quiet Deep and quiet   Iris Round and dilated round and poorly dilated   Lens PCIOL in good position PCIOL in good position   Anterior Vitreous Vitreous syneresis, PVD, vitreous condensations Vitreous syneresis, Posterior vitreous detachment, vitreous condensations, Weiss ring         Fundus Exam       Right Left   Disc mild pallor, sharp rim, mild PPA Sharp rim, mild temporal Pallor   C/D Ratio 0.3 0.4   Macula Flat, Good foveal reflex, RPE mottling, no heme or edema Good foveal reflex, trace cystic changes, IRH/CWS superior and temporal macula -- stably improved, +ERM SN macula   Vessels attenuated, Tortuous attenuated, Tortuous, +sclerosis ST arcades   Periphery Attached, no heme Attached, IRH superior midzone (extension of ST BRVO) -- improved, focal MA and circinate exudates nasal to disc- improving           IMAGING AND PROCEDURES  Imaging and Procedures for 12/07/2024             ASSESSMENT/PLAN:    ICD-10-CM   1. Branch retinal vein occlusion of left eye with macular edema (HCC)  H34.8320 OCT, Retina - OU - Both Eyes    2. Essential hypertension  I10     3. Hypertensive retinopathy of both eyes  H35.033     4. Epiretinal membrane (ERM) of left eye  H35.372     5. Pseudophakia of both eyes  Z96.1      1. BRVO w/ CME OS - s/p IVA OS #1 (9.21.22), #2 (10.19.22), #3 (11.16.22), #4 (12.28.22), #5 (02.22.23), #6 (06.07.23), #7 (07.26.23), #8 (09.13.23), #9 (10.18.23)  -- IVA resistance =================== - s/p IVE OS #1 (11.22.23), #2 (01.02.24), #3 (02.14.24), #4 (03.20.24), #5 (04.24.24), #5 (05.29.24), #6 (07.09.24), #7 (08.13.24), #9 (09.10.24), #10 (10.08.24), #11 (11.05.24),  #12 (12.03.24), #13 (12.31.24), #14 (01.28.25), #15 (02.25.25), #16 (04.02.25), #17 (05.07.25)  -- IVE resistance =================== - s/p IVV OS #1 (06.16.25), #2 (07.21.25), #3 (08.25.25), #4 (09.29.25), #5 (11.03.25) - pt was first seen on 8.10.22, but could not be treated with intravitreal injection because BP was extremely high R arm (199/122) and L arm (186/110) - pt was referred back to PCP, who has since restarted BP meds - FA (05.29.24) shows OS: Severe vascular attenuation ST quad with capillary drop out and +telangiectasias, prominent dilated and tortuous venule off nasal disc, no NV - BCVA OS 20/30 from 20/25 - OCT shows OS: Trace persistent cystic changes/IRF perifovea, stable IRA ST macula; persistent ERM with blunting of foveal contour at 5 weeks - IRA suggestive of BRAO component **history of increased IRF at 5 wks, noted on 08.13.24** IVE **history of increase in IRF at 8 weeks, noted on 04.19.23 OCT** IVE **history of increase in IRF at 7 weeks, noted on 09.13.23 OCT** IVA - recommend IVV today OS #6 (12.08.25) w/ f/u in 5-6 wks - pt wishes to proceed with injection of IVV - RBA of procedure discussed, questions answered  - IVV informed consent obtained and  signed, 06.16.25 (OS) - see procedure note - Eylea  and Vabysmo  approved for 2025 - f/u in 5-6 weeks, DFE/OCT, possible injection(s)  2,3. Hypertensive retinopathy OU - BPs at presentation (8.10.22) were extremely high -- R arm (199/122) and L arm (186/110) - pt was referred back to PCP, who has since restarted BP meds - discussed importance of tight BP control  4. Epiretinal membrane, left eye  - mild ERM - BCVA 20/30 from 20/25 - asymptomatic, no metamorphopsia - no indication for surgery at this time - monitor  5. Pseudophakia OU  - s/p CE/IOL OU (S. Groat)  - IOLs in good position, doing well - monitor  Ophthalmic Meds Ordered this visit:  No orders of the defined types were placed in this encounter.    No follow-ups on file.  There are no Patient Instructions on file for this visit.  This document serves as a record of services personally performed by Redell JUDITHANN Hans, MD, PhD. It was created on their behalf by Avelina Pereyra, COA an ophthalmic technician. The creation of this record is the provider's dictation and/or activities during the visit.   Electronically signed by: Avelina GORMAN Pereyra, COT  12/07/24  10:34 AM   This document serves as a record of services personally performed by Redell JUDITHANN Hans, MD, PhD. It was created on their behalf by Wanda GEANNIE Keens, COT an ophthalmic technician. The creation of this record is the provider's dictation and/or activities during the visit.    Electronically signed by:  Wanda GEANNIE Keens, COT  12/07/24 10:34 AM  Redell JUDITHANN Hans, M.D., Ph.D. Diseases & Surgery of the Retina and Vitreous [facility]    Abbreviations: M myopia (nearsighted); A astigmatism; H hyperopia (farsighted); P presbyopia; Mrx spectacle prescription;  CTL contact lenses; OD right eye; OS left eye; OU both eyes  XT exotropia; ET esotropia; PEK punctate epithelial keratitis; PEE punctate epithelial erosions; DES dry eye syndrome; MGD  meibomian gland dysfunction; ATs artificial tears; PFAT's preservative free artificial tears; NSC nuclear sclerotic cataract; PSC posterior subcapsular cataract; ERM epi-retinal membrane; PVD posterior vitreous detachment; RD retinal detachment; DM diabetes mellitus; DR diabetic retinopathy; NPDR non-proliferative diabetic retinopathy; PDR proliferative diabetic retinopathy; CSME clinically significant macular edema; DME diabetic macular edema; dbh dot blot hemorrhages; CWS cotton wool spot; POAG primary open angle glaucoma; C/D cup-to-disc ratio; HVF humphrey visual field; GVF goldmann visual field; OCT optical coherence tomography; IOP intraocular pressure; BRVO Branch retinal vein occlusion; CRVO central retinal vein occlusion; CRAO central retinal artery occlusion; BRAO branch retinal artery occlusion; RT retinal tear; SB scleral buckle; PPV pars plana vitrectomy; VH Vitreous hemorrhage; PRP panretinal laser photocoagulation; IVK intravitreal kenalog; VMT vitreomacular traction; MH Macular hole;  NVD neovascularization of the disc; NVE neovascularization elsewhere; AREDS age related eye disease study; ARMD age related macular degeneration; POAG primary open angle glaucoma; EBMD epithelial/anterior basement membrane dystrophy; ACIOL anterior chamber intraocular lens; IOL intraocular lens; PCIOL posterior chamber intraocular lens; Phaco/IOL phacoemulsification with intraocular lens placement; PRK photorefractive keratectomy; LASIK laser assisted in situ keratomileusis; HTN hypertension; DM diabetes mellitus; COPD chronic obstructive pulmonary disease

## 2024-12-07 ENCOUNTER — Encounter (INDEPENDENT_AMBULATORY_CARE_PROVIDER_SITE_OTHER): Payer: Self-pay | Admitting: Ophthalmology

## 2024-12-07 ENCOUNTER — Ambulatory Visit (INDEPENDENT_AMBULATORY_CARE_PROVIDER_SITE_OTHER): Admitting: Ophthalmology

## 2024-12-07 DIAGNOSIS — H35372 Puckering of macula, left eye: Secondary | ICD-10-CM

## 2024-12-07 DIAGNOSIS — H35033 Hypertensive retinopathy, bilateral: Secondary | ICD-10-CM | POA: Diagnosis not present

## 2024-12-07 DIAGNOSIS — H34832 Tributary (branch) retinal vein occlusion, left eye, with macular edema: Secondary | ICD-10-CM | POA: Diagnosis not present

## 2024-12-07 DIAGNOSIS — Z961 Presence of intraocular lens: Secondary | ICD-10-CM

## 2024-12-07 DIAGNOSIS — I10 Essential (primary) hypertension: Secondary | ICD-10-CM | POA: Diagnosis not present

## 2024-12-08 ENCOUNTER — Encounter (INDEPENDENT_AMBULATORY_CARE_PROVIDER_SITE_OTHER): Payer: Self-pay | Admitting: Ophthalmology

## 2024-12-08 MED ORDER — FARICIMAB-SVOA 6 MG/0.05ML IZ SOSY
6.0000 mg | PREFILLED_SYRINGE | INTRAVITREAL | Status: AC | PRN
  Administered 2024-12-08: 6 mg via INTRAVITREAL

## 2025-01-08 NOTE — Progress Notes (Signed)
 " [facility] - Clinic Note  01/18/2025    CHIEF COMPLAINT Patient presents for Retina Follow Up  HISTORY OF PRESENT ILLNESS: Gabrielle Hearld is a 69 y.o. male who presents to the clinic today for:   HPI     Retina Follow Up   Patient presents with  CRVO/BRVO.  In left eye.  This started 6 weeks ago.  Duration of 6 weeks.  Since onset it is stable.  I, the attending physician,  performed the HPI with the patient and updated documentation appropriately.        Comments   6 week retina follow up BRVO OS Pt is reporting vision is about the same he denies any flashes has some floaters       Last edited by Valdemar Rogue, MD on 01/18/2025  1:35 PM.      Patient states he is not having any issues with his vision.   Referring physician: Octavia Charlie Hamilton, MD [address] [place], [place] [address]  HISTORICAL INFORMATION:  Selected notes from the MEDICAL RECORD NUMBER Referred by Dr. Octavia for eval of BRVO OS   CURRENT MEDICATIONS: No current outpatient medications on file. (Ophthalmic Drugs)   No current facility-administered medications for this visit. (Ophthalmic Drugs)   Current Outpatient Medications (Other)  Medication Sig   aspirin  81 MG chewable tablet Chew 81 mg by mouth daily.   atorvastatin  (LIPITOR) 20 MG tablet Take 20 mg by mouth daily at 6 PM.   carvedilol  (COREG ) 3.125 MG tablet TAKE 1 TABLET BY MOUTH TWICE A DAY *PLS CALL OFFICE FOR ADDITIONAL REFILLS*   fenofibrate micronized (LOFIBRA) 134 MG capsule Take 134 mg by mouth daily.   lisinopril -hydrochlorothiazide  (ZESTORETIC ) 10-12.5 MG tablet Take 1 tablet by mouth daily.   metoprolol  succinate (TOPROL -XL) 25 MG 24 hr tablet Take 25 mg by mouth daily.   No current facility-administered medications for this visit. (Other)   REVIEW OF SYSTEMS: ROS   Positive for: Cardiovascular, Eyes Negative for: Constitutional, Gastrointestinal, Neurological, Skin, Genitourinary, Musculoskeletal, HENT,  Endocrine, Respiratory, Psychiatric, Allergic/Imm, Heme/Lymph Last edited by Resa Delon ORN, COT on 01/18/2025  9:21 AM.       ALLERGIES No Known Allergies  PAST MEDICAL HISTORY Past Medical History:  Diagnosis Date   Abdominal aortic atherosclerosis    Seen on catheterization   CAD, multiple vessel 05/18/2014   Coronary artery disease    Essential hypertension    Hypertensive retinopathy    NSTEMI (non-ST elevated myocardial infarction) (HCC) 05/17/2014   Post CABG Echo 09/19/2014: Normal EF 55-60% with mild Conc LVH. 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   LIMA to LAD, SVG to D1, Sequential SVG to PDA and RPL2, EVH via right thigh and leg   Past Surgical History:  Procedure Laterality Date   CARDIAC CATHETERIZATION  05/17/2014   DR HARDING: 95% proximal LAD, 90% ramus intermedius, 95% mid circumflex after OM1, neither percent RPDA   CORONARY ARTERY BYPASS GRAFT N/A 05/20/2014   Procedure: CORONARY ARTERY BYPASS GRAFTING (CABG) x 4,  LIMA-MID LAD,  SVG-DIAGONAL,  SEQUENTIAL SVG -PDA, PLB #2;  Surgeon: Sudie VEAR Laine, MD;  Location: [facility] OR;  Service: Open Heart Surgery;  Laterality: N/A;   INTRAOPERATIVE TRANSESOPHAGEAL ECHOCARDIOGRAM N/A 05/20/2014   Procedure: INTRAOPERATIVE TRANSESOPHAGEAL ECHOCARDIOGRAM;  Surgeon: Sudie VEAR Laine, MD;  Location: [facility] OR;  Service: Open Heart Surgery;  Laterality: N/A;   LEFT HEART CATHETERIZATION WITH CORONARY ANGIOGRAM N/A 05/18/2014  Procedure: LEFT HEART CATHETERIZATION WITH CORONARY ANGIOGRAM;  Surgeon: Alm LELON Clay, MD;  Location: [facility] CATH LAB;  Service: Cardiovascular;  Laterality: N/A;   TRANSTHORACIC ECHOCARDIOGRAM  09/14/2014   Normal EF 55-60% with mild Conc LVH. No regional WMA, essentially normal   FAMILY HISTORY Family History  Problem Relation Age of Onset   Heart disease Brother        lives in [place]    Heart attack Neg Hx    Stroke Neg Hx    SOCIAL HISTORY Social History   Tobacco Use    Smoking status: Never   Smokeless tobacco: Never  Vaping Use   Vaping status: Never Used  Substance Use Topics   Alcohol use: Never   Drug use: Never       OPHTHALMIC EXAM: Base Eye Exam     Visual Acuity (Snellen - Linear)       Right Left   Dist [place] 20/30 -1 20/60 -2   Dist ph [place] NI 20/30         Tonometry (Tonopen, 9:29 AM)       Right Left   Pressure 15 16         Pupils       Pupils Dark Light Shape React APD   Right PERRL 3 2 Round Brisk None   Left PERRL 3 2 Round Brisk None         Visual Fields       Left Right    Full Full         Extraocular Movement       Right Left    Full, Ortho Full, Ortho         Neuro/Psych     Oriented x3: Yes   Mood/Affect: Normal         Dilation     Both eyes: 2.5% Phenylephrine  @ 9:29 AM           Slit Lamp and Fundus Exam     Slit Lamp Exam       Right Left   Lids/Lashes Dermatochalasis - upper lid Dermatochalasis - upper lid   Conjunctiva/Sclera White and quiet Mild melanosis   Cornea Arcus, Well healed cataract wound Arcus, Well healed cataract wound   Anterior Chamber Deep and quiet Deep and quiet   Iris Round and dilated round and poorly dilated   Lens PCIOL in good position PCIOL in good position   Anterior Vitreous Vitreous syneresis, PVD, vitreous condensations Vitreous syneresis, Posterior vitreous detachment, vitreous condensations, Weiss ring         Fundus Exam       Right Left   Disc mild pallor, sharp rim, mild PPA Sharp rim, mild temporal Pallor   C/D Ratio 0.3 0.4   Macula Flat, Good foveal reflex, RPE mottling, no heme or edema Good foveal reflex, trace cystic changes, IRH/CWS superior and temporal macula -- stably improved, +ERM SN macula   Vessels attenuated, Tortuous attenuated, Tortuous, +sclerosis ST arcades   Periphery Attached, no heme Attached, IRH superior midzone (extension of ST BRVO) -- improved, focal DBH and circinate exudates nasal to disc            IMAGING AND PROCEDURES  Imaging and Procedures for 01/18/2025  OCT, Retina - OU - Both Eyes       Right Eye Quality was good. Central Foveal Thickness: 267. Progression has been stable. Findings include normal foveal contour, no IRF, no SRF, retinal drusen .   Left Eye Quality  was good. Central Foveal Thickness: 315. Progression has been stable. Findings include no SRF, abnormal foveal contour, intraretinal hyper-reflective material, cystoid macular edema, epiretinal membrane, intraretinal fluid, inner retinal atrophy (Trace persistent cystic changes/IRF perifovea, stable IRA ST macula; persistent ERM with blunting of foveal contour).   Notes *Images captured and stored on drive  Diagnosis / Impression:  OD: NFP, no IRF/SRF OS: Trace persistent cystic changes/IRF perifovea, stable IRA ST macula; persistent ERM with blunting of foveal contourr  Clinical management:  See below  Abbreviations: NFP - Normal foveal profile. CME - cystoid macular edema. PED - pigment epithelial detachment. IRF - intraretinal fluid. SRF - subretinal fluid. EZ - ellipsoid zone. ERM - epiretinal membrane. ORA - outer retinal atrophy. ORT - outer retinal tubulation. SRHM - subretinal hyper-reflective material. IRHM - intraretinal hyper-reflective material      Intravitreal Injection, Pharmacologic Agent - OS - Left Eye       Time Out 01/18/2025. 10:44 AM. Confirmed correct patient, procedure, site, and patient consented.   Anesthesia Topical anesthesia was used. Anesthetic medications included Lidocaine  2%, Proparacaine 0.5%.   Procedure Preparation included 5% betadine to ocular surface, eyelid speculum. A supplied (32g) needle was used.   Injection: 6 mg faricimab -svoa 6 MG/0.05ML   Route: Intravitreal, Site: Left Eye   NDC: 49757-903-93, Lot: [id number], Expiration date: 01/29/2026, Waste: 0 mL   Post-op Post injection exam found visual acuity of at least counting fingers. The patient tolerated  the procedure well. There were no complications. The patient received written and verbal post procedure care education. Post injection medications were not given.             ASSESSMENT/PLAN:    ICD-10-CM   1. Branch retinal vein occlusion of left eye with macular edema (HCC)  H34.8320 OCT, Retina - OU - Both Eyes    Intravitreal Injection, Pharmacologic Agent - OS - Left Eye    faricimab -svoa (VABYSMO ) 6mg /0.98mL intravitreal injection    2. Essential hypertension  I10     3. Hypertensive retinopathy of both eyes  H35.033     4. Epiretinal membrane (ERM) of left eye  H35.372     5. Pseudophakia of both eyes  Z96.1      1. BRVO w/ CME OS - s/p IVA OS #1 (9.21.22), #2 (10.19.22), #3 (11.16.22), #4 (12.28.22), #5 (02.22.23), #6 (06.07.23), #7 (07.26.23), #8 (09.13.23), #9 (10.18.23)  -- IVA resistance =================== - s/p IVE OS #1 (11.22.23), #2 (01.02.24), #3 (02.14.24), #4 (03.20.24), #5 (04.24.24), #5 (05.29.24), #6 (07.09.24), #7 (08.13.24), #9 (09.10.24), #10 (10.08.24), #11 (11.05.24),  #12 (12.03.24), #13 (12.31.24), #14 (01.28.25), #15 (02.25.25), #16 (04.02.25), #17 (05.07.25)  -- IVE resistance =================== - s/p IVV OS #1 (06.16.25), #2 (07.21.25), #3 (08.25.25), #4 (09.29.25), #5 (11.03.25), #6 (12.08.25) - pt was first seen on 8.10.22, but could not be treated with intravitreal injection because BP was extremely high R arm (199/122) and L arm (186/110) - pt was referred back to PCP, who has since restarted BP meds - FA (05.29.24) shows OS: Severe vascular attenuation ST quad with capillary drop out and +telangiectasias, prominent dilated and tortuous venule off nasal disc, no NV - BCVA OS 20/30 - stable - OCT shows OS: Trace persistent cystic changes/IRF perifovea, stable IRA ST macula; persistent ERM with blunting of foveal contour at 6 weeks - IRA suggestive of BRAO component **history of increased IRF at 5 wks, noted on 08.13.24** IVE **history of  increase in IRF at 8 weeks, noted on 04.19.23 OCT** IVE **  history of increase in IRF at 7 weeks, noted on 09.13.23 OCT** IVA - recommend IVV today OS #7 (01.19.26) w/ f/u in 6 wks - pt wishes to proceed with injection of IVV - RBA of procedure discussed, questions answered  - IVV informed consent obtained and signed, 06.16.25 (OS) - see procedure note - Eylea  and Vabysmo  approved and both payable 100% (MCD is secondary) - f/u in 6 weeks, DFE/OCT, possible injection(s)  2,3. Hypertensive retinopathy OU - BPs at presentation (8.10.22) were extremely high -- R arm (199/122) and L arm (186/110) - pt was referred back to PCP, who has since restarted BP meds - discussed importance of tight BP control   4. Epiretinal membrane, left eye  - mild ERM - BCVA 20/30 from 20/25 - asymptomatic, no metamorphopsia - no indication for surgery at this time - monitor   5. Pseudophakia OU  - s/p CE/IOL OU (S. [place])  - IOLs in good position, doing well - monitor   Ophthalmic Meds Ordered this visit:  Meds ordered this encounter  Medications   faricimab -svoa (VABYSMO ) 6mg /0.39mL intravitreal injection     Return in about 6 weeks (around 03/01/2025) for BRVO OS, DFE, OCT, likely IVV OS.  There are no Patient Instructions on file for this visit.  This document serves as a record of services personally performed by Redell JUDITHANN Hans, MD, PhD. It was created on their behalf by Paulina Jamse Gay an ophthalmic technician. The creation of this record is the provider's dictation and/or activities during the visit.   Electronically signed by: Alana D Fowler  01/25/25  3:51 PM   This document serves as a record of services personally performed by Redell JUDITHANN Hans, MD, PhD. It was created on their behalf by Almetta Pesa, an ophthalmic technician. The creation of this record is the provider's dictation and/or activities during the visit.    Electronically signed by: Almetta Pesa, OA, 01/25/25  3:51  PM  Redell JUDITHANN Hans, M.D., Ph.D. Diseases & Surgery of the Retina and Vitreous [facility]  I have reviewed the above documentation for accuracy and completeness, and I agree with the above. Redell JUDITHANN Hans, M.D., Ph.D. 01/25/25 3:54 PM   Abbreviations: M myopia (nearsighted); A astigmatism; H hyperopia (farsighted); P presbyopia; Mrx spectacle prescription;  CTL contact lenses; OD right eye; OS left eye; OU both eyes  XT exotropia; ET esotropia; PEK punctate epithelial keratitis; PEE punctate epithelial erosions; DES dry eye syndrome; MGD meibomian gland dysfunction; ATs artificial tears; PFAT's preservative free artificial tears; NSC nuclear sclerotic cataract; PSC posterior subcapsular cataract; ERM epi-retinal membrane; PVD posterior vitreous detachment; RD retinal detachment; DM diabetes mellitus; DR diabetic retinopathy; NPDR non-proliferative diabetic retinopathy; PDR proliferative diabetic retinopathy; CSME clinically significant macular edema; DME diabetic macular edema; dbh dot blot hemorrhages; CWS cotton wool spot; POAG primary open angle glaucoma; C/D cup-to-disc ratio; HVF humphrey visual field; GVF goldmann visual field; OCT optical coherence tomography; IOP intraocular pressure; BRVO Branch retinal vein occlusion; CRVO central retinal vein occlusion; CRAO central retinal artery occlusion; BRAO branch retinal artery occlusion; RT retinal tear; SB scleral buckle; PPV pars plana vitrectomy; VH Vitreous hemorrhage; PRP panretinal laser photocoagulation; IVK intravitreal kenalog; VMT vitreomacular traction; MH Macular hole;  NVD neovascularization of the disc; NVE neovascularization elsewhere; AREDS age related eye disease study; ARMD age related macular degeneration; POAG primary open angle glaucoma; EBMD epithelial/anterior basement membrane dystrophy; ACIOL anterior chamber intraocular lens; IOL intraocular lens; PCIOL posterior chamber intraocular lens; Phaco/IOL  phacoemulsification with intraocular lens placement; PRK photorefractive keratectomy; LASIK laser assisted in situ keratomileusis; HTN hypertension; DM diabetes mellitus; COPD chronic obstructive pulmonary disease "

## 2025-01-18 ENCOUNTER — Encounter (INDEPENDENT_AMBULATORY_CARE_PROVIDER_SITE_OTHER): Payer: Self-pay | Admitting: Ophthalmology

## 2025-01-18 ENCOUNTER — Ambulatory Visit (INDEPENDENT_AMBULATORY_CARE_PROVIDER_SITE_OTHER): Admitting: Ophthalmology

## 2025-01-18 DIAGNOSIS — H34832 Tributary (branch) retinal vein occlusion, left eye, with macular edema: Secondary | ICD-10-CM

## 2025-01-18 DIAGNOSIS — Z961 Presence of intraocular lens: Secondary | ICD-10-CM

## 2025-01-18 DIAGNOSIS — H35372 Puckering of macula, left eye: Secondary | ICD-10-CM | POA: Diagnosis not present

## 2025-01-18 DIAGNOSIS — H35033 Hypertensive retinopathy, bilateral: Secondary | ICD-10-CM | POA: Diagnosis not present

## 2025-01-18 DIAGNOSIS — I10 Essential (primary) hypertension: Secondary | ICD-10-CM | POA: Diagnosis not present

## 2025-01-18 MED ORDER — FARICIMAB-SVOA 6 MG/0.05ML IZ SOSY
6.0000 mg | PREFILLED_SYRINGE | INTRAVITREAL | Status: AC | PRN
  Administered 2025-01-18: 6 mg via INTRAVITREAL

## 2025-03-01 ENCOUNTER — Encounter (INDEPENDENT_AMBULATORY_CARE_PROVIDER_SITE_OTHER): Admitting: Ophthalmology
